# Patient Record
Sex: Female | Born: 1965
Health system: Southern US, Community
[De-identification: ages and names within clinical notes are randomized; demographics above are authoritative.]

## PROBLEM LIST (undated history)

## (undated) DIAGNOSIS — K219 Gastro-esophageal reflux disease without esophagitis: Secondary | ICD-10-CM

## (undated) DIAGNOSIS — I10 Essential (primary) hypertension: Secondary | ICD-10-CM

## (undated) DIAGNOSIS — M1990 Unspecified osteoarthritis, unspecified site: Secondary | ICD-10-CM

## (undated) DIAGNOSIS — R011 Cardiac murmur, unspecified: Secondary | ICD-10-CM

## (undated) HISTORY — DX: Unspecified osteoarthritis, unspecified site: M19.90

## (undated) HISTORY — PX: LEEP: SHX91

---

## 2005-07-16 ENCOUNTER — Ambulatory Visit: Payer: Self-pay | Admitting: Unknown Physician Specialty

## 2005-07-20 ENCOUNTER — Emergency Department: Payer: Self-pay | Admitting: Emergency Medicine

## 2005-07-20 ENCOUNTER — Other Ambulatory Visit: Payer: Self-pay

## 2006-02-20 ENCOUNTER — Emergency Department: Payer: Self-pay | Admitting: Emergency Medicine

## 2006-02-21 ENCOUNTER — Emergency Department: Payer: Self-pay | Admitting: General Practice

## 2006-04-05 ENCOUNTER — Emergency Department: Payer: Self-pay | Admitting: Unknown Physician Specialty

## 2007-03-24 ENCOUNTER — Ambulatory Visit: Payer: Self-pay | Admitting: Unknown Physician Specialty

## 2014-02-22 ENCOUNTER — Observation Stay: Payer: Self-pay | Admitting: Surgery

## 2014-02-22 LAB — COMPREHENSIVE METABOLIC PANEL
ALBUMIN: 4 g/dL (ref 3.4–5.0)
ALT: 17 U/L
ANION GAP: 12 (ref 7–16)
AST: 26 U/L (ref 15–37)
Alkaline Phosphatase: 107 U/L
BUN: 7 mg/dL (ref 7–18)
Bilirubin,Total: 0.1 mg/dL — ABNORMAL LOW (ref 0.2–1.0)
CHLORIDE: 110 mmol/L — AB (ref 98–107)
Calcium, Total: 8.2 mg/dL — ABNORMAL LOW (ref 8.5–10.1)
Co2: 18 mmol/L — ABNORMAL LOW (ref 21–32)
Creatinine: 0.72 mg/dL (ref 0.60–1.30)
EGFR (African American): >60
EGFR (Non-African Amer.): >60
Glucose: 95 mg/dL (ref 65–99)
Osmolality: 277 (ref 275–301)
POTASSIUM: 3.6 mmol/L (ref 3.5–5.1)
Sodium: 140 mmol/L (ref 136–145)
TOTAL PROTEIN: 8.2 g/dL (ref 6.4–8.2)

## 2014-02-22 LAB — CBC WITH DIFFERENTIAL/PLATELET
Basophil #: 0.1 10*3/uL (ref 0.0–0.1)
Basophil %: 1 %
EOS PCT: 2.2 %
Eosinophil #: 0.3 10*3/uL (ref 0.0–0.7)
HCT: 38.6 % (ref 35.0–47.0)
HGB: 12.1 g/dL (ref 12.0–16.0)
LYMPHS PCT: 10.4 %
Lymphocyte #: 1.4 10*3/uL (ref 1.0–3.6)
MCH: 26.6 pg (ref 26.0–34.0)
MCHC: 31.4 g/dL — ABNORMAL LOW (ref 32.0–36.0)
MCV: 85 fL (ref 80–100)
Monocyte #: 0.9 x10 3/mm (ref 0.2–0.9)
Monocyte %: 6.4 %
Neutrophil #: 11.1 10*3/uL — ABNORMAL HIGH (ref 1.4–6.5)
Neutrophil %: 80 %
Platelet: 423 10*3/uL (ref 150–440)
RBC: 4.56 10*6/uL (ref 3.80–5.20)
RDW: 17.8 % — ABNORMAL HIGH (ref 11.5–14.5)
WBC: 13.9 10*3/uL — ABNORMAL HIGH (ref 3.6–11.0)

## 2014-02-22 LAB — ETHANOL: Ethanol: 298 mg/dL

## 2014-02-23 LAB — CBC WITH DIFFERENTIAL/PLATELET
Basophil #: 0.1 10*3/uL (ref 0.0–0.1)
Basophil %: 1.3 %
Eosinophil #: 0.2 10*3/uL (ref 0.0–0.7)
Eosinophil %: 2 %
HCT: 39.1 % (ref 35.0–47.0)
HGB: 12.2 g/dL (ref 12.0–16.0)
LYMPHS ABS: 1.5 10*3/uL (ref 1.0–3.6)
Lymphocyte %: 14.8 %
MCH: 26.2 pg (ref 26.0–34.0)
MCHC: 31.3 g/dL — AB (ref 32.0–36.0)
MCV: 84 fL (ref 80–100)
Monocyte #: 1.4 x10 3/mm — ABNORMAL HIGH (ref 0.2–0.9)
Monocyte %: 13.2 %
NEUTROS ABS: 7.1 10*3/uL — AB (ref 1.4–6.5)
NEUTROS PCT: 68.7 %
Platelet: 473 10*3/uL — ABNORMAL HIGH (ref 150–440)
RBC: 4.66 10*6/uL (ref 3.80–5.20)
RDW: 17.9 % — ABNORMAL HIGH (ref 11.5–14.5)
WBC: 10.4 10*3/uL (ref 3.6–11.0)
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
## 2016-04-08 ENCOUNTER — Encounter: Payer: Self-pay | Admitting: Physician Assistant

## 2016-04-08 ENCOUNTER — Ambulatory Visit (INDEPENDENT_AMBULATORY_CARE_PROVIDER_SITE_OTHER): Payer: Self-pay | Admitting: Physician Assistant

## 2016-04-08 VITALS — BP 128/84 | HR 102 | Temp 98.3°F | Resp 20 | Wt 209.0 lb

## 2016-04-08 DIAGNOSIS — J069 Acute upper respiratory infection, unspecified: Secondary | ICD-10-CM

## 2016-04-08 DIAGNOSIS — R509 Fever, unspecified: Secondary | ICD-10-CM

## 2016-04-08 DIAGNOSIS — R6889 Other general symptoms and signs: Secondary | ICD-10-CM

## 2016-04-08 LAB — POCT INFLUENZA A/B
Influenza A, POC: NEGATIVE
Influenza B, POC: NEGATIVE

## 2016-04-08 MED ORDER — LEVALBUTEROL HCL 1.25 MG/0.5ML IN NEBU: 1.2500 mg | INHALATION_SOLUTION | Freq: Once | RESPIRATORY_TRACT | Status: DC

## 2016-04-08 MED ORDER — PREDNISONE 20 MG PO TABS: 40.0000 mg | ORAL_TABLET | Freq: Every day | ORAL | 0 refills | Status: AC

## 2016-04-08 MED ORDER — ALBUTEROL SULFATE HFA 108 (90 BASE) MCG/ACT IN AERS: 2.0000 | INHALATION_SPRAY | Freq: Four times a day (QID) | RESPIRATORY_TRACT | 2 refills | Status: DC | PRN

## 2016-04-08 MED ORDER — DOXYCYCLINE HYCLATE 100 MG PO TABS: 100.0000 mg | ORAL_TABLET | Freq: Two times a day (BID) | ORAL | 0 refills | Status: AC

## 2016-04-08 NOTE — Progress Notes (Addendum)
Tamara Hoffman  MRN: [id number] DOB: 04/12/1965  Subjective:  HPI   The patient is a 51 year old female with a 20 pack year smoking history but no other health issues and on no other medications who presents today with symptoms of cold with cough, short of breath, fatigue, fever and neck pain. She denies ear pain. She is not nauseas or vomiting. Her cough is not productive. She began having symptoms 1 week ago.  She has tried Thera Flu and Brink's Companylka Seltzer cold with no relief.  She states she is very fatigued and was unable to take the steps to get up here today.  She has a headache and is a bit sensitive to light. Patient denies pregnancy.  There are no active problems to display for this patient.   No past medical history on file.  Social History   Social History  . Marital status: Married    Spouse name: N/A  . Number of children: N/A  . Years of education: N/A   Occupational History  . Not on file.   Social History Main Topics  . Smoking status: Not on file  . Smokeless tobacco: Not on file  . Alcohol use Not on file  . Drug use: Unknown  . Sexual activity: Not on file   Other Topics Concern  . Not on file   Social History Narrative  . No narrative on file    No outpatient encounter prescriptions on file as of 04/08/2016.   No facility-administered encounter medications on file as of 04/08/2016.     Allergies  Allergen Reactions  . Phenergan [Promethazine Hcl] Swelling    Review of Systems  Constitutional: Positive for chills, diaphoresis, fever and malaise/fatigue.  HENT: Positive for congestion, ear pain, hearing loss, sinus pain, sore throat and tinnitus. Negative for ear discharge and nosebleeds.   Eyes: Positive for blurred vision, double vision, photophobia and pain. Negative for discharge and redness.  Respiratory: Positive for cough, shortness of breath and wheezing. Negative for hemoptysis and sputum production.   Cardiovascular: Positive for palpitations,  orthopnea and leg swelling (and hand swelling). Negative for chest pain.  Gastrointestinal: Positive for heartburn. Negative for abdominal pain, blood in stool, constipation, diarrhea, melena, nausea and vomiting.  Musculoskeletal: Positive for myalgias and neck pain.  Neurological: Positive for dizziness, weakness and headaches.    Objective:  BP 128/84 (BP Location: Right Arm, Patient Position: Sitting, Cuff Size: Normal)   Pulse (!) 102   Temp 98.3 F (36.8 C) (Oral)   Resp 20   Wt 209 lb (94.8 kg)   SpO2 95%   Physical Exam  Constitutional:  Non-toxic appearance. She has a sickly appearance.  HENT:  Right Ear: Tympanic membrane normal.  Left Ear: Tympanic membrane normal.  Mouth/Throat: Oropharynx is clear and moist. No oropharyngeal exudate or posterior oropharyngeal edema.  Neck: Neck supple. No neck rigidity. No edema and normal range of motion present. No Brudzinski's sign noted.  Cardiovascular: Regular rhythm.  Tachycardia present.   Pulmonary/Chest: She has wheezes. She has no rales.  Diffuse expiratory wheezing. No SOB or accessory muscle usage.  Abdominal: Soft. Bowel sounds are normal.  Lymphadenopathy:    She has no cervical adenopathy.    Assessment and Plan :  1. Fever, unspecified fever cause  Patient has 20 pack year smoking history, likely some chronic lung changes and COPD like flare in context of viral illness. Administered Xopenex in office with moderate resolution of symptoms upon re-examination. Flu was  negative and with patient's prolonged course of symptoms, will not prescribe Tamiflu. Deferring CXR today 2/2 financial issues. Will also treat with abx and prednisone and albuterol inhaler.  - levalbuterol (XOPENEX) nebulizer solution 1.25 mg; Take 1.25 mg by nebulization once.  2. Flu-like symptoms  - POCT Influenza A/B  3. Upper respiratory tract infection, unspecified type  - doxycycline (VIBRA-TABS) 100 MG tablet; Take 1 tablet (100 mg total) by  mouth 2 (two) times daily.  Dispense: 14 tablet; Refill: 0 - predniSONE (DELTASONE) 20 MG tablet; Take 2 tablets (40 mg total) by mouth daily with breakfast.  Dispense: 10 tablet; Refill: 0 - albuterol (PROVENTIL HFA;VENTOLIN HFA) 108 (90 Base) MCG/ACT inhaler; Inhale 2 puffs into the lungs every 6 (six) hours as needed for wheezing or shortness of breath.  Dispense: 1 Inhaler; Refill: 2  I have spent 25 minutes with this patient, >50% of which was spent on counseling and coordination of care.  The entirety of the information documented in the History of Present Illness, Review of Systems and Physical Exam were personally obtained by me. Portions of this information were initially documented by Janey Greaser, CMA and reviewed by me for thoroughness and accuracy.    Patient Instructions  Upper Respiratory Infection, Adult Most upper respiratory infections (URIs) are caused by a virus. A URI affects the nose, throat, and upper air passages. The most common type of URI is often called "the common cold." Follow these instructions at home:  Take medicines only as told by your doctor.  Gargle warm saltwater or take cough drops to comfort your throat as told by your doctor.  Use a warm mist humidifier or inhale steam from a shower to increase air moisture. This may make it easier to breathe.  Drink enough fluid to keep your pee (urine) clear or pale yellow.  Eat soups and other clear broths.  Have a healthy diet.  Rest as needed.  Go back to work when your fever is gone or your doctor says it is okay.  You may need to stay home longer to avoid giving your URI to others.  You can also wear a face mask and wash your hands often to prevent spread of the virus.  Use your inhaler more if you have asthma.  Do not use any tobacco products, including cigarettes, chewing tobacco, or electronic cigarettes. If you need help quitting, ask your doctor. Contact a doctor if:  You are getting worse,  not better.  Your symptoms are not helped by medicine.  You have chills.  You are getting more short of breath.  You have brown or red mucus.  You have yellow or brown discharge from your nose.  You have pain in your face, especially when you bend forward.  You have a fever.  You have puffy (swollen) neck glands.  You have pain while swallowing.  You have white areas in the back of your throat. Get help right away if:  You have very bad or constant:  Headache.  Ear pain.  Pain in your forehead, behind your eyes, and over your cheekbones (sinus pain).  Chest pain.  You have long-lasting (chronic) lung disease and any of the following:  Wheezing.  Long-lasting cough.  Coughing up blood.  A change in your usual mucus.  You have a stiff neck.  You have changes in your:  Vision.  Hearing.  Thinking.  Mood. This information is not intended to replace advice given to you by your health care provider.  Make sure you discuss any questions you have with your health care provider. Document Released: 08/05/2007 Document Revised: 10/20/2015 Document Reviewed: 05/24/2013 Elsevier Interactive Patient Education  2017 [employer].   The entirety of the information documented in the History of Present Illness, Review of Systems and Physical Exam were personally obtained by me. Portions of this information were initially documented by Janey Greaser, CMA and reviewed by me for thoroughness and accuracy.

## 2016-04-08 NOTE — Patient Instructions (Signed)
Upper Respiratory Infection, Adult Most upper respiratory infections (URIs) are caused by a virus. A URI affects the nose, throat, and upper air passages. The most common type of URI is often called "the common cold." Follow these instructions at home:  Take medicines only as told by your doctor.  Gargle warm saltwater or take cough drops to comfort your throat as told by your doctor.  Use a warm mist humidifier or inhale steam from a shower to increase air moisture. This may make it easier to breathe.  Drink enough fluid to keep your pee (urine) clear or pale yellow.  Eat soups and other clear broths.  Have a healthy diet.  Rest as needed.  Go back to work when your fever is gone or your doctor says it is okay.  You may need to stay home longer to avoid giving your URI to others.  You can also wear a face mask and wash your hands often to prevent spread of the virus.  Use your inhaler more if you have asthma.  Do not use any tobacco products, including cigarettes, chewing tobacco, or electronic cigarettes. If you need help quitting, ask your doctor. Contact a doctor if:  You are getting worse, not better.  Your symptoms are not helped by medicine.  You have chills.  You are getting more short of breath.  You have brown or red mucus.  You have yellow or brown discharge from your nose.  You have pain in your face, especially when you bend forward.  You have a fever.  You have puffy (swollen) neck glands.  You have pain while swallowing.  You have white areas in the back of your throat. Get help right away if:  You have very bad or constant:  Headache.  Ear pain.  Pain in your forehead, behind your eyes, and over your cheekbones (sinus pain).  Chest pain.  You have long-lasting (chronic) lung disease and any of the following:  Wheezing.  Long-lasting cough.  Coughing up blood.  A change in your usual mucus.  You have a stiff neck.  You have  changes in your:  Vision.  Hearing.  Thinking.  Mood. This information is not intended to replace advice given to you by your health care provider. Make sure you discuss any questions you have with your health care provider. Document Released: 08/05/2007 Document Revised: 10/20/2015 Document Reviewed: 05/24/2013 Elsevier Interactive Patient Education  2017 [employer] Inc.  

## 2016-04-14 ENCOUNTER — Telehealth: Payer: Self-pay | Admitting: Physician Assistant

## 2016-04-14 NOTE — Telephone Encounter (Signed)
Pt states she is on the last day of her antibiotics, she states she is still having coughing and having to take a breathing treatment everyday.  She states she is feeling better in her head and no fever but she still is having lung congestions.

## 2016-04-14 NOTE — Telephone Encounter (Signed)
Preferably would like patient to come back into office. Cough/congestion with these illnesses can linger, but she may need another nebulizer treatment. Is she taking her inhaler? Would also like CXR, this was deferred last time 2/2 financial reasons but I would like this before extending any antibiotic treatments.

## 2016-04-14 NOTE — Telephone Encounter (Signed)
Pt advised.  She agreed to come in, but she will have to call back and schedule an appointment.  She says she has to find someone to care for her elderly disabled mother.   Thanks,   -Vernona RiegerLaura

## 2016-04-15 NOTE — Telephone Encounter (Signed)
Sounds good, she may call back w/ questions in the mean time.

## 2017-02-17 ENCOUNTER — Encounter: Payer: Self-pay | Admitting: Emergency Medicine

## 2017-02-17 ENCOUNTER — Other Ambulatory Visit: Payer: Self-pay

## 2017-02-17 ENCOUNTER — Inpatient Hospital Stay
Admission: EM | Admit: 2017-02-17 | Discharge: 2017-02-18 | DRG: 395 | Discharge status: Against Medical Advice | Payer: Self-pay | Attending: Internal Medicine | Admitting: Internal Medicine

## 2017-02-17 DIAGNOSIS — K55059 Acute (reversible) ischemia of intestine, part and extent unspecified: Principal | ICD-10-CM | POA: Diagnosis present

## 2017-02-17 DIAGNOSIS — Z888 Allergy status to other drugs, medicaments and biological substances status: Secondary | ICD-10-CM

## 2017-02-17 DIAGNOSIS — F17210 Nicotine dependence, cigarettes, uncomplicated: Secondary | ICD-10-CM | POA: Diagnosis present

## 2017-02-17 DIAGNOSIS — F1490 Cocaine use, unspecified, uncomplicated: Secondary | ICD-10-CM | POA: Diagnosis present

## 2017-02-17 DIAGNOSIS — K922 Gastrointestinal hemorrhage, unspecified: Secondary | ICD-10-CM | POA: Diagnosis present

## 2017-02-17 DIAGNOSIS — I10 Essential (primary) hypertension: Secondary | ICD-10-CM | POA: Diagnosis present

## 2017-02-17 DIAGNOSIS — K219 Gastro-esophageal reflux disease without esophagitis: Secondary | ICD-10-CM | POA: Diagnosis present

## 2017-02-17 DIAGNOSIS — K0889 Other specified disorders of teeth and supporting structures: Secondary | ICD-10-CM | POA: Diagnosis present

## 2017-02-17 DIAGNOSIS — Z7951 Long term (current) use of inhaled steroids: Secondary | ICD-10-CM

## 2017-02-17 HISTORY — DX: Gastro-esophageal reflux disease without esophagitis: K21.9

## 2017-02-17 HISTORY — DX: Essential (primary) hypertension: I10

## 2017-02-17 LAB — TYPE AND SCREEN
ABO/RH(D): A POS
Antibody Screen: NEGATIVE

## 2017-02-17 LAB — COMPREHENSIVE METABOLIC PANEL
ALBUMIN: 4.2 g/dL (ref 3.5–5.0)
ALK PHOS: 116 U/L (ref 38–126)
ALT: 22 U/L (ref 14–54)
AST: 22 U/L (ref 15–41)
Anion gap: 10 (ref 5–15)
BILIRUBIN TOTAL: 0.6 mg/dL (ref 0.3–1.2)
BUN: 17 mg/dL (ref 6–20)
CO2: 24 mmol/L (ref 22–32)
CREATININE: 0.72 mg/dL (ref 0.44–1.00)
Calcium: 9.4 mg/dL (ref 8.9–10.3)
Chloride: 102 mmol/L (ref 101–111)
GFR calc Af Amer: >60 mL/min (ref >60)
GFR calc non Af Amer: >60 mL/min (ref >60)
GLUCOSE: 106 mg/dL — AB (ref 65–99)
Potassium: 4.2 mmol/L (ref 3.5–5.1)
Sodium: 136 mmol/L (ref 135–145)
TOTAL PROTEIN: 7.8 g/dL (ref 6.5–8.1)

## 2017-02-17 LAB — CBC
HEMATOCRIT: 47.6 % — AB (ref 35.0–47.0)
Hemoglobin: 15.5 g/dL (ref 12.0–16.0)
MCH: 29.5 pg (ref 26.0–34.0)
MCHC: 32.6 g/dL (ref 32.0–36.0)
MCV: 90.5 fL (ref 80.0–100.0)
PLATELETS: 293 10*3/uL (ref 150–440)
RBC: 5.26 MIL/uL — ABNORMAL HIGH (ref 3.80–5.20)
RDW: 15.9 % — AB (ref 11.5–14.5)
WBC: 13.3 10*3/uL — ABNORMAL HIGH (ref 3.6–11.0)

## 2017-02-17 LAB — URINE DRUG SCREEN, QUALITATIVE (ARMC ONLY)
Amphetamines, Ur Screen: NOT DETECTED
BARBITURATES, UR SCREEN: NOT DETECTED
BENZODIAZEPINE, UR SCRN: NOT DETECTED
CANNABINOID 50 NG, UR ~~LOC~~: POSITIVE — AB
Cocaine Metabolite,Ur ~~LOC~~: POSITIVE — AB
MDMA (Ecstasy)Ur Screen: NOT DETECTED
Methadone Scn, Ur: NOT DETECTED
Opiate, Ur Screen: NOT DETECTED
PHENCYCLIDINE (PCP) UR S: NOT DETECTED
Tricyclic, Ur Screen: NOT DETECTED

## 2017-02-17 LAB — URINALYSIS, COMPLETE (UACMP) WITH MICROSCOPIC
BACTERIA UA: NONE SEEN
Bilirubin Urine: NEGATIVE
Glucose, UA: NEGATIVE mg/dL
Ketones, ur: NEGATIVE mg/dL
Leukocytes, UA: NEGATIVE
NITRITE: NEGATIVE
PROTEIN: NEGATIVE mg/dL
Specific Gravity, Urine: 1.02 (ref 1.005–1.030)
pH: 5 (ref 5.0–8.0)

## 2017-02-17 LAB — LIPASE, BLOOD: Lipase: 21 U/L (ref 11–51)

## 2017-02-17 LAB — ETHANOL

## 2017-02-17 LAB — HEMOGLOBIN: Hemoglobin: 15.2 g/dL (ref 12.0–16.0)

## 2017-02-17 LAB — PREGNANCY, URINE: PREG TEST UR: NEGATIVE

## 2017-02-17 MED ORDER — OCTREOTIDE LOAD VIA INFUSION
50.0000 ug | Freq: Once | INTRAVENOUS | Status: AC
  Administered 2017-02-17: 50 ug via INTRAVENOUS
  Filled 2017-02-17: qty 25

## 2017-02-17 MED ORDER — MORPHINE SULFATE (PF) 2 MG/ML IV SOLN
1.0000 mg | INTRAVENOUS | Status: AC
  Administered 2017-02-17: 1 mg via INTRAVENOUS
  Filled 2017-02-17: qty 1

## 2017-02-17 MED ORDER — SODIUM CHLORIDE 0.9 % IV SOLN
8.0000 mg/h | INTRAVENOUS | Status: DC
  Administered 2017-02-17 – 2017-02-18 (×2): 8 mg/h via INTRAVENOUS
  Filled 2017-02-17 (×2): qty 80

## 2017-02-17 MED ORDER — SODIUM CHLORIDE 0.9 % IV SOLN
50.0000 ug/h | INTRAVENOUS | Status: DC
Start: 2017-02-17 — End: 2017-02-18
  Administered 2017-02-17 (×2): 50 ug/h via INTRAVENOUS
  Filled 2017-02-17 (×6): qty 1

## 2017-02-17 MED ORDER — ALBUTEROL SULFATE (2.5 MG/3ML) 0.083% IN NEBU: 2.5000 mg | INHALATION_SOLUTION | Freq: Four times a day (QID) | RESPIRATORY_TRACT | Status: DC | PRN

## 2017-02-17 MED ORDER — SODIUM CHLORIDE 0.9 % IV SOLN
80.0000 mg | Freq: Once | INTRAVENOUS | Status: AC
  Administered 2017-02-17: 80 mg via INTRAVENOUS
  Filled 2017-02-17: qty 80

## 2017-02-17 MED ORDER — SODIUM CHLORIDE 0.9 % IV SOLN
INTRAVENOUS | Status: DC
  Administered 2017-02-17 – 2017-02-18 (×2): via INTRAVENOUS

## 2017-02-17 MED ORDER — ONDANSETRON HCL 4 MG/2ML IJ SOLN
4.0000 mg | Freq: Once | INTRAMUSCULAR | Status: AC
  Administered 2017-02-17: 4 mg via INTRAVENOUS
  Filled 2017-02-17: qty 2

## 2017-02-17 MED ORDER — TOPIRAMATE 25 MG PO TABS
50.0000 mg | ORAL_TABLET | Freq: Two times a day (BID) | ORAL | Status: DC | PRN
  Administered 2017-02-17: 50 mg via ORAL
  Filled 2017-02-17 (×2): qty 2

## 2017-02-17 MED ORDER — ACETAMINOPHEN 325 MG PO TABS
650.0000 mg | ORAL_TABLET | Freq: Four times a day (QID) | ORAL | Status: DC | PRN
  Administered 2017-02-17 – 2017-02-18 (×2): 650 mg via ORAL
  Filled 2017-02-17 (×2): qty 2

## 2017-02-17 MED ORDER — LORAZEPAM 2 MG/ML IJ SOLN
1.0000 mg | Freq: Once | INTRAMUSCULAR | Status: AC
  Administered 2017-02-17: 1 mg via INTRAVENOUS
  Filled 2017-02-17: qty 1

## 2017-02-17 MED ORDER — HYDRALAZINE HCL 20 MG/ML IJ SOLN
10.0000 mg | Freq: Four times a day (QID) | INTRAMUSCULAR | Status: DC | PRN
  Administered 2017-02-17: 10 mg via INTRAVENOUS
  Filled 2017-02-17: qty 1

## 2017-02-17 MED ORDER — ONDANSETRON HCL 4 MG/2ML IJ SOLN
4.0000 mg | Freq: Four times a day (QID) | INTRAMUSCULAR | Status: DC | PRN
  Administered 2017-02-17: 4 mg via INTRAVENOUS
  Filled 2017-02-17: qty 2

## 2017-02-17 MED ORDER — INFLUENZA VAC SPLIT QUAD 0.5 ML IM SUSY
0.5000 mL | PREFILLED_SYRINGE | INTRAMUSCULAR | Status: DC
  Filled 2017-02-17: qty 0.5

## 2017-02-17 MED ORDER — TRAMADOL HCL 50 MG PO TABS
50.0000 mg | ORAL_TABLET | Freq: Four times a day (QID) | ORAL | Status: DC | PRN
  Administered 2017-02-17: 50 mg via ORAL
  Filled 2017-02-17: qty 1

## 2017-02-17 MED ORDER — ALBUTEROL SULFATE HFA 108 (90 BASE) MCG/ACT IN AERS: 2.0000 | INHALATION_SPRAY | Freq: Four times a day (QID) | RESPIRATORY_TRACT | Status: DC | PRN

## 2017-02-17 MED ORDER — ONDANSETRON HCL 4 MG PO TABS: 4.0000 mg | ORAL_TABLET | Freq: Four times a day (QID) | ORAL | Status: DC | PRN

## 2017-02-17 MED ORDER — ACETAMINOPHEN 650 MG RE SUPP: 650.0000 mg | Freq: Four times a day (QID) | RECTAL | Status: DC | PRN

## 2017-02-17 NOTE — H&P (Signed)
[facility] [place] [facility]   PATIENT NAME: Tamara Hoffman    MR#:  [id number]  DATE OF BIRTH:  07/22/1965  DATE OF ADMISSION:  02/17/2017  PRIMARY CARE PHYSICIAN: Tamsen Roershrismon, Dennis E, PA   REQUESTING/REFERRING PHYSICIAN: Dr. Daryel NovemberJonathan Williams  CHIEF COMPLAINT:   Chief Complaint  Patient presents with  . Abdominal Pain  . Hematemesis    HISTORY OF PRESENT ILLNESS:  Tamara Hoffman  is a 51 y.o. female with a known history of hypertension not in any medications, GERD presents to hospital secondary to bloody diarrhea. Patient has had history of reflux in the past and she was on Prilosec up until 3 months ago. She has occasional epigastric pain. Last week she's been having toothache for which she was taking a lot of Advil and also Goody powders for the pain. Yesterday she ate seafood at a restaurant, and she also took magnesium hydroxide for constipation. She started having nausea and had bloody vomitus after that. Later in the day she started having lower abdominal cramping with bloody stools. She's had several episodes of blood in rectum since last night. Hemoglobin initially is 15. She is started on Protonix drip and octreotide drip due to history of alcohol use. She drinks about 3-4 beers every night. Bleeding scan is pending at this time.  PAST MEDICAL HISTORY:   Past Medical History:  Diagnosis Date  . GERD (gastroesophageal reflux disease)   . Hypertension     PAST SURGICAL HISTORY:   Past Surgical History:  Procedure Laterality Date  . LEEP      SOCIAL HISTORY:   Social History   Tobacco Use  . Smoking status: Current Every Day Smoker    Packs/day: 1.00    Types: Cigarettes  . Smokeless tobacco: Never Used  Substance Use Topics  . Alcohol use: Yes    Comment: 3-4 beers/day    FAMILY HISTORY:   Family History  Problem Relation Age of Onset  . Diabetes Mother   . Hypertension Father     DRUG ALLERGIES:   Allergies  Allergen  Reactions  . Phenergan [Promethazine Hcl] Swelling    REVIEW OF SYSTEMS:   Review of Systems  Constitutional: Positive for malaise/fatigue. Negative for chills, fever and weight loss.  HENT: Negative for ear discharge, ear pain, hearing loss and nosebleeds.   Eyes: Negative for blurred vision, double vision and photophobia.  Respiratory: Negative for cough, hemoptysis, shortness of breath and wheezing.   Cardiovascular: Negative for chest pain, palpitations, orthopnea and leg swelling.  Gastrointestinal: Positive for abdominal pain, blood in stool, heartburn, nausea and vomiting. Negative for constipation, diarrhea and melena.  Genitourinary: Negative for dysuria, frequency, hematuria and urgency.  Musculoskeletal: Negative for back pain, myalgias and neck pain.  Skin: Negative for rash.  Neurological: Positive for headaches. Negative for dizziness, tingling, tremors, sensory change, speech change and focal weakness.  Endo/Heme/Allergies: Does not bruise/bleed easily.  Psychiatric/Behavioral: Negative for depression.    MEDICATIONS AT HOME:   Prior to Admission medications   Medication Sig Start Date End Date Taking? Authorizing Provider  albuterol (PROVENTIL HFA;VENTOLIN HFA) 108 (90 Base) MCG/ACT inhaler Inhale 2 puffs into the lungs every 6 (six) hours as needed for wheezing or shortness of breath. 04/08/16   Trey SailorsPollak, Adriana M, PA-C      VITAL SIGNS:  Blood pressure (!) 182/105, pulse 98, temperature (!) 97.4 F (36.3 C), temperature source Oral, resp. rate 16, height 5\' 4"  (1.626 m), weight 79.4 kg (175 lb),  SpO2 96 %.  PHYSICAL EXAMINATION:   Physical Exam  GENERAL:  51 y.o.-year-old patient lying in the bed with no acute distress.  EYES: Pupils equal, round, reactive to light and accommodation. No scleral icterus. Extraocular muscles intact.  HEENT: Head atraumatic, normocephalic. Oropharynx and nasopharynx clear.  NECK:  Supple, no jugular venous distention. No thyroid  enlargement, no tenderness.  LUNGS: Normal breath sounds bilaterally, no wheezing, rales,rhonchi or crepitation. No use of accessory muscles of respiration.  CARDIOVASCULAR: S1, S2 normal. No murmurs, rubs, or gallops.  ABDOMEN: Soft, tender in the epigastric and lower abdomen, no guarding or rigidity. nondistended. Bowel sounds present. No organomegaly or mass.  EXTREMITIES: No pedal edema, cyanosis, or clubbing.  NEUROLOGIC: Cranial nerves II through XII are intact. Muscle strength 5/5 in all extremities. Sensation intact. Gait not checked.  PSYCHIATRIC: The patient is alert and oriented x 3.  SKIN: No obvious rash, lesion, or ulcer.   LABORATORY PANEL:   CBC Recent Labs  Lab 02/17/17 1223  WBC 13.3*  HGB 15.5  HCT 47.6*  PLT 293   ------------------------------------------------------------------------------------------------------------------  Chemistries  Recent Labs  Lab 02/17/17 1223  NA 136  K 4.2  CL 102  CO2 24  GLUCOSE 106*  BUN 17  CREATININE 0.72  CALCIUM 9.4  AST 22  ALT 22  ALKPHOS 116  BILITOT 0.6   ------------------------------------------------------------------------------------------------------------------  Cardiac Enzymes No results for input(s): TROPONINI in the last 168 hours. ------------------------------------------------------------------------------------------------------------------  RADIOLOGY:  No results found.  EKG:   Orders placed or performed in visit on 07/20/05  . EKG 12-Lead    IMPRESSION AND PLAN:   Tamara Hoffman  is a 51 y.o. female with a known history of hypertension not in any medications, GERD presents to hospital secondary to bloody diarrhea.   1. Bloody diarrhea and hematemesis- possible upper gi bleed - uses NSAIDS and BC powders at home - NPO, IV protonix and IV octreotide -GI notified. -Bleeding scan. Hemoglobin checked every 8 hours and monitor. -No acute indication for transfusion at this time.  2.  Tobacco use disorder-patient refuses nicotine patch.  3. Alcohol use-patient denies going into withdrawals. She drinks only 3-4 beers most nights. Continue to monitor at this time  4. Hypertension-does not take any medications at home. IV hydralazine when necessary for now  5. DVT prophylaxis-Ted's and SCDs   All the records are reviewed and case discussed with ED provider. Management plans discussed with the patient, family and they are in agreement.  CODE STATUS: Full Code  TOTAL TIME TAKING CARE OF THIS PATIENT: 50 minutes.    Enid BaasKALISETTI,Telesforo Brosnahan M.D on 02/17/2017 at 3:28 PM  Between 7am to 6pm - Pager - [phone]  After 6pm go to [email or web address] - Social research officer, governmentpassword EPAS [facility]  [facility]  Office  [phone]  CC: Primary care physician; Chrismon, Jodell Ciproennis E, PA

## 2017-02-17 NOTE — ED Notes (Signed)
NM called this RN asking about last time pt noticed bleeding. Pt states last bloody stool was 4hrs ago, making it 1 hr PTA. States last emesis around 4am.

## 2017-02-17 NOTE — ED Notes (Addendum)
Pt states L lower tooth pain x few weeks. "I've been taking all this advil and stuff." states had constipation. Drank magnesium and states began vomiting blood yesterday. States pink in color. States passing dark red and clots per rectum. States "really my stomach has been messed up x 2 months." states has all belly organs. States she has defecated 12 times. Pt appears anxious. Tearful during assessment. Dr. Mayford KnifeWilliams at bedside.   Admits to taking advil liquid caps (states she squirted contents into mouth) and goody powders.   States nausea. States hasn't been able to drink anything. States she feels "sensation of having to go" and admits to straining for BM.   Denies blood thinner use. Admits to ETOH, denies every day use.

## 2017-02-17 NOTE — Consult Note (Signed)
Tamara Hoffman , MD [address], [address], [place], [place], [id number] [address], [address], [place], [place], [address] Phone: [phone]  Fax: [phone]  Consultation  Referring Provider:  Dr Nemiah CommanderKalisetti Primary Care Physician:  Tamsen Roershrismon, Dennis E, PA Primary Gastroenterologist:  None    Reason for Consultation:   GI bleed  Date of Admission:  02/17/2017 Date of Consultation:  02/17/2017         HPI:   Tamara Hoffman is a 51 y.o. female presented to the emergency room on 02/17/2017 with hematemesis.  She has been taking a lot of Advil and Goody powders for toothache in the past 1 week.  She started having some nausea and bloody vomitus after having food at a restaurant yesterday.  She also had some lower abdominal pain with cramping and bloody stools.  She has a history of alcohol abuse she drinks about 3-4 beers every night.  Hemoglobin admission was stable at 15 g.  Urine was positive for cocaine and THC.  There was no elevation of the BUN which was 17 on admission.  She initially denied any use of cocaine but when I did say she had some in her urine , she became very upset and confrontational. I did assure her that the cocaine in her urine would not affect the method of arriving of her medical diagnosis per my opinion. She said she had a lot of pain from her teeth. Denies any bowel movement since yesterday . Not in any pain since admission.   Past Medical History:  Diagnosis Date  . GERD (gastroesophageal reflux disease)   . Hypertension     Past Surgical History:  Procedure Laterality Date  . LEEP      Prior to Admission medications   Medication Sig Start Date End Date Taking? Authorizing Provider  Aspirin-Acetaminophen-Caffeine (GOODYS EXTRA STRENGTH PO) Take 1-2 packets by mouth as needed.   Yes [provider]  ibuprofen (ADVIL,MOTRIN) 200 MG tablet Take 200-400 mg by mouth every 6 (six) hours as needed.   Yes [provider]  albuterol (PROVENTIL  HFA;VENTOLIN HFA) 108 (90 Base) MCG/ACT inhaler Inhale 2 puffs into the lungs every 6 (six) hours as needed for wheezing or shortness of breath. Patient not taking: Reported on 02/17/2017 04/08/16   Trey SailorsPollak, Adriana M, PA-C    Family History  Problem Relation Age of Onset  . Diabetes Mother   . Hypertension Father   . Heart disease Father   . Diabetes Sister   . Cancer Maternal Grandmother      Social History   Tobacco Use  . Smoking status: Current Every Day Smoker    Packs/day: 0.50    Types: Cigarettes  . Smokeless tobacco: Never Used  Substance Use Topics  . Alcohol use: Yes    Comment: 4-5 times a week (3 or 4 beers)  . Drug use: No    Allergies as of 02/17/2017 - Review Complete 02/17/2017  Allergen Reaction Noted  . Phenergan [promethazine hcl] Swelling 04/08/2016    Review of Systems:    All systems reviewed and negative except where noted in HPI.   Physical Exam:  Vital signs in last 24 hours: Temp:  [97.4 F (36.3 C)-98.2 F (36.8 C)] 97.8 F (36.6 C) (12/19 1957) Pulse Rate:  [89-113] 113 (12/19 1957) Resp:  [11-20] 16 (12/19 1957) BP: (158-194)/(92-120) 158/103 (12/19 1957) SpO2:  [90 %-99 %] 99 % (12/19 1957) Weight:  [175 lb (79.4 kg)] 175 lb (79.4 kg) (  12/19 1223) Last BM Date: 02/17/17 General:   Pleasant, cooperative in NAD Head:  Normocephalic and atraumatic. Eyes:   No icterus.   Conjunctiva pink. PERRLA. Ears:  Normal auditory acuity. Neck:  Supple; no masses or thyroidomegaly Lungs: Respirations even and unlabored. Lungs clear to auscultation bilaterally.   No wheezes, crackles, or rhonchi.  Heart:  Regular rate and rhythm;  Without murmur, clicks, rubs or gallops Abdomen:  Soft, nondistended, nontender. Normal bowel sounds. No appreciable masses or hepatomegaly.  No rebound or guarding.  Neurologic:  Alert and oriented x3;  grossly normal neurologically. Skin:  Intact without significant lesions or rashes. Cervical Nodes:  No significant  cervical adenopathy. Psych:  Alert and cooperative. Normal affect.  LAB RESULTS: Recent Labs    02/17/17 1223 02/17/17 1611  WBC 13.3*  --   HGB 15.5 15.2  HCT 47.6*  --   PLT 293  --    BMET Recent Labs    02/17/17 1223  NA 136  K 4.2  CL 102  CO2 24  GLUCOSE 106*  BUN 17  CREATININE 0.72  CALCIUM 9.4   LFT Recent Labs    02/17/17 1223  PROT 7.8  ALBUMIN 4.2  AST 22  ALT 22  ALKPHOS 116  BILITOT 0.6   PT/INR No results for input(s): LABPROT, INR in the last 72 hours.  STUDIES: No results found.    Impression / Plan:   Tamara Hoffman is a 51 y.o. y/o female presented to the emergency room with a short history of nausea vomiting hematemesis and bloody diarrhea.  She has a prior history of ingestion of seafood at a restaurant following which her symptoms began.  She has been using a lot of NSAIDs and Goody powder for her to take the past 1 week. Differentials for a GI bleed include peptic ulcer vs mallory weiss tear from [facility], ischemic colitis from cocaine use, food poisoning or bleeding from her gums after teeth extraction  The urine toxicology showed that she had cocaine and THC in her urine.  Her hemoglobin has been stable.  Upper endoscopy would be contraindicated due to the presence of cocaine in her urine.  It is reassuring to note that her hemoglobin has been stable.  I would suggest to continue on PPI monitor her hemoglobin and if stable she can be discharged with follow-up with the PCP.  Continue PPI. Stop all NSAID use.  Check stool for H. pylori antigen and treat if positive.  I strongly suggested to stop drinking alcohol/usage of cocaine.   I have discussed the plan with Dr Nemiah CommanderKalisetti  Thank you for involving me in the care of this patient.      LOS: 0 days   Tamara MoodKiran Britanny Marksberry, MD  02/17/2017, 8:12 PM

## 2017-02-17 NOTE — Progress Notes (Signed)
No bleeding in the last 6 hrs- discontinued the bleeding scan

## 2017-02-17 NOTE — Progress Notes (Signed)
HCPOA/AD materials dropped off with patient.  CH available to review as needed. 

## 2017-02-17 NOTE — ED Notes (Signed)
Pt states hasn't taken BP medications x 10 years.

## 2017-02-17 NOTE — ED Triage Notes (Addendum)
Pt presents to ED with c/o lower abdominal pain, vomiting blood and rectal bleeding. Pt states has been taking Advil gel capsules, goodies powder, and keflex due to dental pain. Pt states last night began vomiting blood, pt states she vomited up "everything in [her] stomach and then when that was done it was just blood". Pt states "blood was pouring out of her rectum". Pt is noted to be anxious and tearful in triage at this time.

## 2017-02-17 NOTE — ED Notes (Signed)
Admitting at bedside.  Pt ambulatory to toilet to collect urine sample.

## 2017-02-17 NOTE — ED Provider Notes (Signed)
[facility] Emergency Department Provider Note       Time seen: ----------------------------------------- 12:41 PM on 02/17/2017 -----------------------------------------   I have reviewed the triage vital signs and the nursing notes.  HISTORY   Chief Complaint Abdominal Pain and Hematemesis    HPI Tamara Hoffman is a 51 y.o. female with a history of hypertension who presents to the ED for abdominal pain with hematemesis and rectal bleeding.  Patient states she has been taking Advil gelcaps, Goody's powder and Keflex due to dental pain.  She reports last night she began vomiting blood.  Patient states she vomited up everything in her stomach and then when it was done it was only blood.  She also reports but has been coming out of her rectum as well.  Pain is 7 out of 10 in sharp and cramping in the lower abdomen.  Past Medical History:  Diagnosis Date  . Hypertension     There are no active problems to display for this patient.   Past Surgical History:  Procedure Laterality Date  . LEEP      Allergies Phenergan [promethazine hcl]  Social History Social History   Tobacco Use  . Smoking status: Current Every Day Smoker    Packs/day: 1.00    Types: Cigarettes  . Smokeless tobacco: Never Used  Substance Use Topics  . Alcohol use: Yes  . Drug use: No    Review of Systems Constitutional: Negative for fever. Cardiovascular: Negative for chest pain. Respiratory: Negative for shortness of breath. Gastrointestinal: Positive for abdominal pain, gastrointestinal bleeding Genitourinary: Negative for dysuria. Musculoskeletal: Negative for back pain. Skin: Negative for rash. Neurological: Negative for headaches, focal weakness or numbness.  All systems negative/normal/unremarkable except as stated in the HPI  ____________________________________________   PHYSICAL EXAM:  VITAL SIGNS: ED Triage Vitals [02/17/17 1223]  Enc Vitals Group      BP (!) 174/120     Pulse Rate (!) 107     Resp 20     Temp (!) 97.4 F (36.3 C)     Temp Source Oral     SpO2 99 %     Weight 175 lb (79.4 kg)     Height 5\' 4"  (1.626 m)     Head Circumference      Peak Flow      Pain Score 7     Pain Loc      Pain Edu?      Excl. in GC?     Constitutional: Alert and oriented. Well appearing and in no distress. Eyes: Conjunctivae are normal. Normal extraocular movements. ENT   Head: Normocephalic and atraumatic.   Nose: No congestion/rhinnorhea.   Mouth/Throat: Mucous membranes are moist.   Neck: No stridor. Cardiovascular: Normal rate, regular rhythm. No murmurs, rubs, or gallops. Respiratory: Normal respiratory effort without tachypnea nor retractions. Breath sounds are clear and equal bilaterally. No wheezes/rales/rhonchi. Gastrointestinal: Soft and nontender. Normal bowel sounds Musculoskeletal: Nontender with normal range of motion in extremities. No lower extremity tenderness nor edema. Neurologic:  Normal speech and language. No gross focal neurologic deficits are appreciated.  Skin:  Skin is warm, dry and intact. No rash noted. Psychiatric: Mood and affect are normal. Speech and behavior are normal.  ____________________________________________  ED COURSE:  As part of my medical decision making, I reviewed the following data within the electronic MEDICAL RECORD NUMBER History obtained from family if available, nursing notes, old chart and ekg, as well as notes from prior ED visits. Patient  presented for gastrointestinal bleeding, we will assess with labs and imaging as indicated at this time.   Procedures ____________________________________________   LABS (pertinent positives/negatives)  Labs Reviewed  COMPREHENSIVE METABOLIC PANEL - Abnormal; Notable for the following components:      Result Value   Glucose, Bld 106 (*)    All other components within normal limits  CBC - Abnormal; Notable for the following components:    WBC 13.3 (*)    RBC 5.26 (*)    HCT 47.6 (*)    RDW 15.9 (*)    All other components within normal limits  LIPASE, BLOOD  ETHANOL  URINALYSIS, COMPLETE (UACMP) WITH MICROSCOPIC  URINE DRUG SCREEN, QUALITATIVE ([facility] ONLY)  POC OCCULT BLOOD, ED  POC URINE PREG, ED  TYPE AND SCREEN   CRITICAL CARE Performed by: Hortensia Duffin E   Total critical cEmily Filbertare time: 30 minutes  Critical care time was exclusive of separately billable procedures and treating other patients.  Critical care was necessary to treat or prevent imminent or life-threatening deterioration.  Critical care was time spent personally by me on the following activities: development of treatment plan with patient and/or surrogate as well as nursing, discussions with consultants, evaluation of patient's response to treatment, examination of patient, obtaining history from patient or surrogate, ordering and performing treatments and interventions, ordering and review of laboratory studies, ordering and review of radiographic studies, pulse oximetry and re-evaluation of patient's condition. ____________________________________________  DIFFERENTIAL DIAGNOSIS   Variceal bleed, peptic ulcer disease, anemia, gastritis, dehydration, aortoenteric fistula  FINAL ASSESSMENT AND PLAN  Gastrointestinal bleeding   Plan: Patient had presented for gastrointestinal bleeding that is both upper and lower. Patient's labs are reassuring at this time although we have started her on Protonix and octreotide.  This could be a variceal bleed versus peptic ulcer disease versus alcoholic gastritis.  Rectal bleeding could be coming from hemorrhoids.  At this point her vital signs are stable and she is stable for hospitalist admission.   Emily FilbertWilliams, Narvel Kozub E, MD   Note: This note was generated in part or whole with voice recognition software. Voice recognition is usually quite accurate but there are transcription errors that can and very  often do occur. I apologize for any typographical errors that were not detected and corrected.     Emily FilbertWilliams, Adrion Menz E, MD 02/17/17 [phone]

## 2017-02-18 DIAGNOSIS — K920 Hematemesis: Secondary | ICD-10-CM

## 2017-02-18 LAB — CBC
HCT: 44.7 % (ref 35.0–47.0)
HEMOGLOBIN: 14.8 g/dL (ref 12.0–16.0)
MCH: 30.1 pg (ref 26.0–34.0)
MCHC: 33.2 g/dL (ref 32.0–36.0)
MCV: 90.7 fL (ref 80.0–100.0)
Platelets: 246 10*3/uL (ref 150–440)
RBC: 4.93 MIL/uL (ref 3.80–5.20)
RDW: 15.7 % — ABNORMAL HIGH (ref 11.5–14.5)
WBC: 7.2 10*3/uL (ref 3.6–11.0)

## 2017-02-18 LAB — BASIC METABOLIC PANEL
ANION GAP: 8 (ref 5–15)
BUN: 11 mg/dL (ref 6–20)
CHLORIDE: 104 mmol/L (ref 101–111)
CO2: 26 mmol/L (ref 22–32)
CREATININE: 0.8 mg/dL (ref 0.44–1.00)
Calcium: 9.3 mg/dL (ref 8.9–10.3)
GFR calc non Af Amer: >60 mL/min (ref >60)
GLUCOSE: 115 mg/dL — AB (ref 65–99)
Potassium: 4.2 mmol/L (ref 3.5–5.1)
Sodium: 138 mmol/L (ref 135–145)

## 2017-02-18 LAB — HEMOGLOBIN
Hemoglobin: 14.7 g/dL (ref 12.0–16.0)
Hemoglobin: 15.5 g/dL (ref 12.0–16.0)

## 2017-02-18 LAB — HIV ANTIBODY (ROUTINE TESTING W REFLEX): HIV Screen 4th Generation wRfx: NONREACTIVE

## 2017-02-18 MED ORDER — PANTOPRAZOLE SODIUM 40 MG PO TBEC
40.0000 mg | DELAYED_RELEASE_TABLET | Freq: Two times a day (BID) | ORAL | Status: DC
  Filled 2017-02-18: qty 1

## 2017-02-18 MED ORDER — PANTOPRAZOLE SODIUM 40 MG PO TBEC: 40.0000 mg | DELAYED_RELEASE_TABLET | Freq: Two times a day (BID) | ORAL | 2 refills | Status: DC

## 2017-02-18 NOTE — Discharge Summary (Signed)
[facility] [place] [facility]   PATIENT NAME: Tamara Hoffman    MR#:  [id number]  DATE OF BIRTH:  06/12/1965  DATE OF ADMISSION:  02/17/2017   ADMITTING PHYSICIAN: Enid Baasadhika Sabria Florido, MD  DATE OF DISCHARGE: 02/18/2017 10:33 AM PATIENT LEFT AMA  PRIMARY CARE PHYSICIAN: Chrismon, Jodell Ciproennis E, PA   ADMISSION DIAGNOSIS:   Gastrointestinal hemorrhage, unspecified gastrointestinal hemorrhage type [K92.2]  DISCHARGE DIAGNOSIS:   Active Problems:   GI bleed   SECONDARY DIAGNOSIS:   Past Medical History:  Diagnosis Date  . GERD (gastroesophageal reflux disease)   . Hypertension     HOSPITAL COURSE:   Tamara Hoffman  is a 51 y.o. female with a known history of hypertension not in any medications, GERD presents to hospital secondary to bloody diarrhea.   1. Bloody diarrhea and hematemesis-   -Admitted, no further bloody stools in the hospital. Hemoglobin has remained stable. -Started after eating seafood at an outside restaurant. Could be acute enterocolitis, versus cocaine induced ischemic colitis or upper GI bleed from using too many NSAIDS and BC powders -Patient was started on Protonix drip and octreotide drip due to history of alcohol intake. -Seen by gastroenterology who have recommended no EGD at this time due to cocaine in the urine and also since the bleeding has stopped and hemoglobin is stable. -My plan was to discharge her on oral Protonix and start her on a regular diet prior to discharge. However patient was upset and left AMA before me seeing her this morning due to no EGD being done and about finding cocaine in her urine. -No acute indication for transfusion at this time. - advised on admission not to use NSAIDS or bc powders  2. Tobacco use disorder-patient refused nicotine patch.  3. Alcohol use-patient denies going into withdrawals. She drinks only 3-4 beers most nights.    4. Hypertension-does not take any medications at home. Counseled  about low sodium diet  Again patient left AGAINST MEDICAL ADVICE prior to me seeing her today. Did not take her Protonix prescription.    CONSULTS OBTAINED:   Treatment Team:  Wyline MoodAnna, Kiran, MD  DRUG ALLERGIES:   Allergies  Allergen Reactions  . Phenergan [Promethazine Hcl] Swelling   DISCHARGE MEDICATIONS:   Allergies as of 02/18/2017      Reactions   Phenergan [promethazine Hcl] Swelling      Medication List    STOP taking these medications   GOODYS EXTRA STRENGTH PO   ibuprofen 200 MG tablet Commonly known as:  ADVIL,MOTRIN     TAKE these medications   albuterol 108 (90 Base) MCG/ACT inhaler Commonly known as:  PROVENTIL HFA;VENTOLIN HFA Inhale 2 puffs into the lungs every 6 (six) hours as needed for wheezing or shortness of breath.   pantoprazole 40 MG tablet Commonly known as:  PROTONIX Take 1 tablet (40 mg total) by mouth 2 (two) times daily.        DISCHARGE INSTRUCTIONS:   1. Left AMA 2. Recommend GI follow up  If you experience worsening of your admission symptoms, develop shortness of breath, life threatening emergency, suicidal or homicidal thoughts you must seek medical attention immediately by calling 911 or calling your MD immediately  if symptoms less severe.  You Must read complete instructions/literature along with all the possible adverse reactions/side effects for all the Medicines you take and that have been prescribed to you. Take any new Medicines after you have completely understood and accpet all the possible adverse reactions/side effects.  Please note  You were cared for by a hospitalist during your hospital stay. If you have any questions about your discharge medications or the care you received while you were in the hospital after you are discharged, you can call the unit and asked to speak with the hospitalist on call if the hospitalist that took care of you is not available. Once you are discharged, your primary care physician  will handle any further medical issues. Please note that NO REFILLS for any discharge medications will be authorized once you are discharged, as it is imperative that you return to your primary care physician (or establish a relationship with a primary care physician if you do not have one) for your aftercare needs so that they can reassess your need for medications and monitor your lab values.    On the day of Discharge:  VITAL SIGNS:   Blood pressure (!) 157/84, pulse 76, temperature 97.7 F (36.5 C), temperature source Oral, resp. rate 18, height 5\' 4"  (1.626 m), weight 79.4 kg (175 lb), SpO2 97 %.   DATA REVIEW:   CBC Recent Labs  Lab 02/18/17 0503 02/18/17 0828  WBC 7.2  --   HGB 14.8 15.5  HCT 44.7  --   PLT 246  --     Chemistries  Recent Labs  Lab 02/17/17 1223 02/18/17 0503  NA 136 138  K 4.2 4.2  CL 102 104  CO2 24 26  GLUCOSE 106* 115*  BUN 17 11  CREATININE 0.72 0.80  CALCIUM 9.4 9.3  AST 22  --   ALT 22  --   ALKPHOS 116  --   BILITOT 0.6  --      Microbiology Results  No results found for this or any previous visit.  RADIOLOGY:  No results found.   Management plans discussed with the patient, family and they are in agreement.  CODE STATUS:     Code Status Orders  (From admission, onward)        Start     Ordered   02/17/17 1602  Full code  Continuous     02/17/17 1601    Code Status History    Date Active Date Inactive Code Status Order ID Comments User Context   This patient has a current code status but no historical code status.      TOTAL TIME TAKING CARE OF THIS PATIENT: 36 minutes.    Enid BaasKALISETTI,Dametra Whetsel M.D on 02/18/2017 at 10:41 AM  Between 7am to 6pm - Pager - [phone]  After 6pm go to [email or web address] - Social research officer, governmentpassword [email or web address] [email or web address]  [facility]  Office  [phone]  CC: Primary care physician; Tamsen Roershrismon, Dennis E, PA   Note: This dictation was prepared with Dragon dictation along with smaller  phrase technology. Any transcriptional errors that result from this process are unintentional.

## 2017-02-18 NOTE — Progress Notes (Signed)
Patient wanted to leave the hospital today, RN called me this morning and I did tell that I will be on the floor in 20 minutes as I was planning to discharge her anyway. Went in to see the patient, her hospital gown and was on the bed and her IV was in the trash can. She was nowhere to be seen. It seems like she has already left the hospital. No AMA form was signed at that time.

## 2017-02-18 NOTE — Progress Notes (Signed)
Patient called nurse to room and stated " please take these iv's out or I will ,there is no need for me to be here he said he isn't going to do anything and who is he to judge me and talk to me that way" patient then began crying, consoled patient and explained to her why the doctor told her that he could not do the procedure relating to her having cocaine in her system, patient verbalized understanding. Patient stated " I'm just going to leave". Nurse asked patient if she could hold on a few minutes and let the provider prepare her discharge paperwork, patient stated " yes". When provider arrived to room to discharge patient, patient had left and the hospital gown was on the bed. No AMA form was completed as patient stated that she was  Going to wait on provider to discharge her.

## 2017-12-07 ENCOUNTER — Encounter: Payer: Self-pay | Admitting: Physician Assistant

## 2017-12-07 ENCOUNTER — Ambulatory Visit: Payer: BLUE CROSS/BLUE SHIELD | Admitting: Physician Assistant

## 2017-12-07 VITALS — BP 140/90 | HR 98 | Temp 98.2°F | Resp 16 | Wt 196.0 lb

## 2017-12-07 DIAGNOSIS — Z720 Tobacco use: Secondary | ICD-10-CM

## 2017-12-07 DIAGNOSIS — J40 Bronchitis, not specified as acute or chronic: Secondary | ICD-10-CM | POA: Diagnosis not present

## 2017-12-07 MED ORDER — PREDNISONE 20 MG PO TABS: 20.0000 mg | ORAL_TABLET | Freq: Every day | ORAL | 0 refills | Status: AC

## 2017-12-07 MED ORDER — ALBUTEROL SULFATE HFA 108 (90 BASE) MCG/ACT IN AERS: 2.0000 | INHALATION_SPRAY | Freq: Four times a day (QID) | RESPIRATORY_TRACT | 2 refills | Status: DC | PRN

## 2017-12-07 MED ORDER — DOXYCYCLINE HYCLATE 100 MG PO TABS: 100.0000 mg | ORAL_TABLET | Freq: Two times a day (BID) | ORAL | 0 refills | Status: AC

## 2017-12-07 NOTE — Progress Notes (Signed)
Patient: Tamara Hoffman Female    DOB: 08-06-1965   52 y.o.   MRN: [id number] Visit Date: 12/07/2017  Today's Provider: Trey Sailors, PA-C   Chief Complaint  Patient presents with  . URI   Subjective:    HPI Upper Respiratory Infection: Patient complains of symptoms of a URI, possible sinusitis. Symptoms include congestion, cough and sore throat. Onset of symptoms was 2 weeks ago, gradually worsening since that time. She also c/o cough described as nonproductive for the past 2 weeks .  She is drinking plenty of fluids. Evaluation to date: none. Treatment to date: antihistamines and cough suppressants. Current smoker.      Allergies  Allergen Reactions  . Phenergan [Promethazine Hcl] Swelling     Current Outpatient Medications:  .  pantoprazole (PROTONIX) 40 MG tablet, Take 1 tablet (40 mg total) by mouth 2 (two) times daily., Disp: 60 tablet, Rfl: 2  Review of Systems  Constitutional: Negative.   HENT: Positive for sore throat.   Respiratory: Positive for cough.     Social History   Tobacco Use  . Smoking status: Current Every Day Smoker    Packs/day: 0.50    Types: Cigarettes  . Smokeless tobacco: Never Used  Substance Use Topics  . Alcohol use: Yes    Comment: 4-5 times a week (3 or 4 beers)   Objective:   BP 140/90 (BP Location: Left Arm, Patient Position: Sitting, Cuff Size: Normal)   Pulse 98   Temp 98.2 F (36.8 C) (Oral)   Resp 16   Wt 196 lb (88.9 kg)   SpO2 98%   BMI 33.64 kg/m  Vitals:   12/07/17 1448  BP: 140/90  Pulse: 98  Resp: 16  Temp: 98.2 F (36.8 C)  TempSrc: Oral  SpO2: 98%  Weight: 196 lb (88.9 kg)     Physical Exam  Constitutional: She is oriented to person, place, and time. She appears well-developed and well-nourished. No distress.  HENT:  Right Ear: External ear normal.  Left Ear: External ear normal.  Nose: Right sinus exhibits maxillary sinus tenderness and frontal sinus tenderness. Left sinus exhibits  maxillary sinus tenderness and frontal sinus tenderness.  Mouth/Throat: Oropharynx is clear and moist. No oropharyngeal exudate, posterior oropharyngeal edema or posterior oropharyngeal erythema.  Tms opaque bilaterally   Eyes: Conjunctivae are normal. Right eye exhibits no discharge. Left eye exhibits no discharge.  Neck: Neck supple.  Cardiovascular: Normal rate and regular rhythm.  Pulmonary/Chest: Effort normal. She has wheezes.  Lymphadenopathy:    She has cervical adenopathy.  Neurological: She is alert and oriented to person, place, and time.  Skin: Skin is warm and dry. She is not diaphoretic.  Psychiatric: She has a normal mood and affect. Her behavior is normal.        Assessment & Plan:     1. Bronchitis  Long time smoker, likely has underlying COPD and will treat as such. Have her follow up in office for physical to establish maintenance care and do pulmonary function testing.   - doxycycline (VIBRA-TABS) 100 MG tablet; Take 1 tablet (100 mg total) by mouth 2 (two) times daily for 7 days.  Dispense: 14 tablet; Refill: 0 - predniSONE (DELTASONE) 20 MG tablet; Take 1 tablet (20 mg total) by mouth daily with breakfast for 5 days.  Dispense: 5 tablet; Refill: 0 - albuterol (PROVENTIL HFA;VENTOLIN HFA) 108 (90 Base) MCG/ACT inhaler; Inhale 2 puffs into the lungs every 6 (six) hours  as needed for wheezing or shortness of breath.  Dispense: 1 Inhaler; Refill: 2  2. Tobacco abuse  Return in about 1 month (around 01/07/2018) for CPE.  The entirety of the information documented in the History of Present Illness, Review of Systems and Physical Exam were personally obtained by me. Portions of this information were initially documented by Rondel Baton, CMA and reviewed by me for thoroughness and accuracy.          Trey Sailors, PA-C  [facility]

## 2017-12-07 NOTE — Patient Instructions (Signed)

## 2017-12-10 ENCOUNTER — Telehealth: Payer: Self-pay | Admitting: Physician Assistant

## 2017-12-10 DIAGNOSIS — R05 Cough: Secondary | ICD-10-CM

## 2017-12-10 DIAGNOSIS — R059 Cough, unspecified: Secondary | ICD-10-CM

## 2017-12-10 MED ORDER — BENZONATATE 100 MG PO CAPS: 100.0000 mg | ORAL_CAPSULE | Freq: Three times a day (TID) | ORAL | 0 refills | Status: AC | PRN

## 2017-12-10 NOTE — Telephone Encounter (Signed)
I will send her in some tessalon perles.

## 2017-12-10 NOTE — Telephone Encounter (Signed)
Zyrtec would be fine. We do not have samples of albuterol.

## 2017-12-10 NOTE — Telephone Encounter (Signed)
Patient called back and was advised that Tamara Hoffman perles had been sent into the pharmacy. Patient agrees to try them.  Patient wants to know if she could take OTC Zyrtec? Also, patient mentioned that she was not able to get the albuterol inhaler filled due to cost. She wants to know if we have any sample inhalers?

## 2017-12-10 NOTE — Telephone Encounter (Signed)
I tried calling patient to find out what her question was regarding the inhaler. No answer. Left message to call back. Please advise about patients request for cough syrup.

## 2017-12-10 NOTE — Telephone Encounter (Signed)
Pt stated she had OV with Adriana on 12/07/17 and isn't feeling much better. Pt stated that the cough is keeping her up. Pt is requesting cough syrup Rx be sent to [facility] and would also like a CMA to return her call because she has a question about the inhaler. Please advise. Thanks TNP

## 2017-12-13 NOTE — Telephone Encounter (Signed)
Na. Voice mail not set up 

## 2017-12-24 ENCOUNTER — Encounter: Payer: Self-pay | Admitting: Physician Assistant

## 2018-02-18 ENCOUNTER — Ambulatory Visit
Admission: RE | Admit: 2018-02-18 | Discharge: 2018-02-18 | Disposition: A | Discharge status: Home / Self Care | Payer: BLUE CROSS/BLUE SHIELD | Attending: Family Medicine | Admitting: Family Medicine

## 2018-02-18 ENCOUNTER — Encounter: Payer: Self-pay | Admitting: Family Medicine

## 2018-02-18 ENCOUNTER — Ambulatory Visit: Payer: Self-pay | Admitting: Family Medicine

## 2018-02-18 ENCOUNTER — Ambulatory Visit
Admission: RE | Admit: 2018-02-18 | Discharge: 2018-02-18 | Disposition: A | Discharge status: Home / Self Care | Payer: BLUE CROSS/BLUE SHIELD | Source: Ambulatory Visit | Attending: Family Medicine | Admitting: Family Medicine

## 2018-02-18 ENCOUNTER — Telehealth: Payer: Self-pay

## 2018-02-18 ENCOUNTER — Ambulatory Visit: Payer: BLUE CROSS/BLUE SHIELD | Admitting: Family Medicine

## 2018-02-18 VITALS — BP 128/84 | HR 83 | Temp 98.2°F | Resp 16 | Wt 198.0 lb

## 2018-02-18 DIAGNOSIS — L723 Sebaceous cyst: Secondary | ICD-10-CM

## 2018-02-18 DIAGNOSIS — M19041 Primary osteoarthritis, right hand: Secondary | ICD-10-CM | POA: Insufficient documentation

## 2018-02-18 DIAGNOSIS — M19042 Primary osteoarthritis, left hand: Secondary | ICD-10-CM

## 2018-02-18 MED ORDER — PREDNISONE 5 MG PO TABS: 5.0000 mg | ORAL_TABLET | Freq: Every day | ORAL | 0 refills | Status: DC

## 2018-02-18 NOTE — Progress Notes (Signed)
Patient: Tamara Hoffman Female    DOB: 06/05/1965   52 y.o.   MRN: [id number] Visit Date: 02/18/2018  Today's Provider: Dortha Kernennis Ferguson Gertner, PA   Chief Complaint  Patient presents with  . Hand Pain   Subjective:     Hand Pain   The incident occurred more than 1 week ago (1 year). There was no injury mechanism. The pain is present in the right hand. The quality of the pain is described as aching. The pain radiates to the right hand. The pain is at a severity of 8/10. The pain is severe. The pain has been intermittent since the incident. Associated symptoms include muscle weakness, numbness and tingling. Pertinent negatives include no chest pain. The symptoms are aggravated by movement. She has tried ice (BC powders ) for the symptoms. The treatment provided mild relief.   Patient states she has had right hand pain for 1 year. Patient states pain has worsened. Patient states she has swelling, tingling and numbness in right hand. Patient also states there is slight pain in the left hand. [facility] and uses ice for hands but only has mild relief.   Past Medical History:  Diagnosis Date  . GERD (gastroesophageal reflux disease)   . Hypertension    Past Surgical History:  Procedure Laterality Date  . LEEP     Family History  Problem Relation Age of Onset  . Diabetes Mother   . Hypertension Father   . Heart disease Father   . Diabetes Sister   . Cancer Maternal Grandmother    Allergies  Allergen Reactions  . Phenergan [Promethazine Hcl] Swelling    Current Outpatient Medications:  .  albuterol (PROVENTIL HFA;VENTOLIN HFA) 108 (90 Base) MCG/ACT inhaler, Inhale 2 puffs into the lungs every 6 (six) hours as needed for wheezing or shortness of breath., Disp: 1 Inhaler, Rfl: 2 .  pantoprazole (PROTONIX) 40 MG tablet, Take 1 tablet (40 mg total) by mouth 2 (two) times daily., Disp: 60 tablet, Rfl: 2  Review of Systems  Constitutional: Negative for appetite change,  chills, fatigue and fever.  Respiratory: Negative for chest tightness and shortness of breath.   Cardiovascular: Negative for chest pain and palpitations.  Gastrointestinal: Negative for abdominal pain, nausea and vomiting.  Neurological: Positive for tingling and numbness. Negative for dizziness and weakness.   Social History   Tobacco Use  . Smoking status: Current Every Day Smoker    Packs/day: 0.50    Types: Cigarettes  . Smokeless tobacco: Never Used  Substance Use Topics  . Alcohol use: Yes    Comment: 4-5 times a week (3 or 4 beers)     Objective:   BP 128/84 (BP Location: Right Arm, Patient Position: Sitting, Cuff Size: Large)   Pulse 83   Temp 98.2 F (36.8 C) (Oral)   Resp 16   Wt 198 lb (89.8 kg)   SpO2 99%   BMI 33.99 kg/m  Vitals:   02/18/18 1102  BP: 128/84  Pulse: 83  Resp: 16  Temp: 98.2 F (36.8 C)  TempSrc: Oral  SpO2: 99%  Weight: 198 lb (89.8 kg)   Physical Exam Constitutional:      General: She is not in acute distress.    Appearance: She is well-developed.  HENT:     Head: Normocephalic and atraumatic.     Right Ear: Hearing normal.     Left Ear: Hearing normal.     Nose: Nose normal.  Eyes:     General: Lids are normal. No scleral icterus.       Right eye: No discharge.        Left eye: No discharge.     Conjunctiva/sclera: Conjunctivae normal.  Pulmonary:     Effort: Pulmonary effort is normal. No respiratory distress.  Musculoskeletal:        General: Swelling, tenderness and deformity present.     Comments: Tender and enlarged PIP joints and MCP joints of the right middle and ring fingers. Some ache and enlargement enlargement of left index finger. Decreased grip strength right hand. Stiffness and pain worse in early mornings.  Skin:    Findings: No lesion or rash.     Comments: 1.5 cm sebaceous/epidermal cyst posterior upper back at the base of the C-spine. Not tender or erythematous.  Neurological:     Mental Status: She is  alert and oriented to person, place, and time.  Psychiatric:        Speech: Speech normal.        Behavior: Behavior normal.        Thought Content: Thought content normal.       Assessment & Plan    1. 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 Stiffness, pain and swelling worse in the early mornings. Has tried BC's with slight help but seems to be worsening. Will check labs and x-rays to evaluate joints. Suspect RA versus OA or mixture. May apply moist heat and will give prednisone taper over 6 days. Recheck pending lab and x-ray reports. May need referral to rheumatologist. - CBC with Differential/Platelet - Sedimentation rate - Rheumatoid Arthritis Profile - DG Hand Complete Right - DG Hand Complete Left - predniSONE (DELTASONE) 5 MG tablet; Take 1 tablet (5 mg total) by mouth daily with breakfast. Taper by one tablet daily starting at 6 today (6,5,4,3,2,1)  Dispense: 21 tablet; Refill: 0  2. Sebaceous cyst Approximately 1.5 cm cyst posterior base of neck at the midline. Feels it is getting larger but no pain or drainage. Recommend surgical referral but will wait until later in January or February.     Dortha Kernennis Harley Fitzwater, PA  [facility]

## 2018-02-18 NOTE — Telephone Encounter (Signed)
-----   Message from Jodell Ciproennis E Blue Lakehrismon, [place] sent at 02/18/2018  4:05 PM EST ----- Minimal arthritis of the left 2nd DIP and PIP joints. Mild joint space narrowing and osteophytes of the right 2nd - 4th PIP joints with diffuse soft tissue swelling. Awaiting labs to check for rheumatoid arthritis.

## 2018-02-18 NOTE — Telephone Encounter (Signed)
Patient has been advised. KW 

## 2018-02-20 LAB — CBC WITH DIFFERENTIAL/PLATELET
Basophils Absolute: 0.1 10*3/uL (ref 0.0–0.2)
Basos: 1 %
EOS (ABSOLUTE): 0.2 10*3/uL (ref 0.0–0.4)
EOS: 4 %
HEMATOCRIT: 41.4 % (ref 34.0–46.6)
Hemoglobin: 13.9 g/dL (ref 11.1–15.9)
IMMATURE GRANULOCYTES: 0 %
Immature Grans (Abs): 0 10*3/uL (ref 0.0–0.1)
Lymphocytes Absolute: 1.7 10*3/uL (ref 0.7–3.1)
Lymphs: 27 %
MCH: 31.1 pg (ref 26.6–33.0)
MCHC: 33.6 g/dL (ref 31.5–35.7)
MCV: 93 fL (ref 79–97)
MONOS ABS: 0.6 10*3/uL (ref 0.1–0.9)
Monocytes: 10 %
Neutrophils Absolute: 3.7 10*3/uL (ref 1.4–7.0)
Neutrophils: 58 %
PLATELETS: 284 10*3/uL (ref 150–450)
RBC: 4.47 x10E6/uL (ref 3.77–5.28)
RDW: 13.1 % (ref 12.3–15.4)
WBC: 6.4 10*3/uL (ref 3.4–10.8)

## 2018-02-20 LAB — RHEUMATOID ARTHRITIS PROFILE
CYCLIC CITRULLIN PEPTIDE AB: 7 U (ref 0–19)
Rheumatoid fact SerPl-aCnc: <10 IU/mL (ref 0.0–13.9)

## 2018-02-20 LAB — SEDIMENTATION RATE: Sed Rate: 17 mm/hr (ref 0–40)

## 2018-02-21 ENCOUNTER — Telehealth: Payer: Self-pay | Admitting: *Deleted

## 2018-02-21 NOTE — Telephone Encounter (Signed)
LMOVM for pt to return call 

## 2018-02-21 NOTE — Telephone Encounter (Signed)
Can send the prescription to her pharmacy - which one? She should start it 7-10 days before totally stopping all smoking and continue it for 12 weeks. Recheck progress in a month.

## 2018-02-21 NOTE — Telephone Encounter (Signed)
Patient is requesting an rx for Chantix? Please advise?

## 2018-03-14 ENCOUNTER — Telehealth: Payer: Self-pay | Admitting: Family Medicine

## 2018-03-14 NOTE — Telephone Encounter (Signed)
Need to look at ears to document reason for referral to be sure insurance will cover this issue.

## 2018-03-14 NOTE — Telephone Encounter (Signed)
Patient still has really bad hearing problems and is getting worse.  Can barely hear out of L ear. Patient wants to be referred to ENT.

## 2018-03-14 NOTE — Telephone Encounter (Signed)
Please Review

## 2018-03-14 NOTE — Telephone Encounter (Signed)
Scheduled for Thursday at 3:40.

## 2018-03-15 ENCOUNTER — Other Ambulatory Visit: Payer: Self-pay | Admitting: Family Medicine

## 2018-03-15 DIAGNOSIS — Z720 Tobacco use: Secondary | ICD-10-CM

## 2018-03-15 MED ORDER — VARENICLINE TARTRATE 0.5 MG X 11 & 1 MG X 42 PO MISC: ORAL | 0 refills | Status: DC

## 2018-03-15 NOTE — Telephone Encounter (Signed)
Patient would like Chantix rx sent to [facility].

## 2018-03-15 NOTE — Telephone Encounter (Signed)
Done. She should start it 7-10 days before totally stopping all smoking and continue it for 12 weeks. Recheck appointment to gauge progress in a month.

## 2018-03-15 NOTE — Progress Notes (Signed)
Ready to begin Chantix and quit smoking. She should start it 7-10 days before totally stopping all smoking and continue it for 12 weeks. Recheck progress in a month.

## 2018-03-16 NOTE — Telephone Encounter (Signed)
No answer and no vm

## 2018-03-17 ENCOUNTER — Ambulatory Visit: Payer: Self-pay | Admitting: Family Medicine

## 2018-03-23 NOTE — Telephone Encounter (Signed)
No answer and no vm

## 2018-04-18 ENCOUNTER — Encounter: Payer: Self-pay | Admitting: Family Medicine

## 2018-04-18 ENCOUNTER — Ambulatory Visit: Payer: BLUE CROSS/BLUE SHIELD | Admitting: Family Medicine

## 2018-04-18 VITALS — BP 138/100 | HR 96 | Temp 98.0°F | Resp 16 | Wt 199.0 lb

## 2018-04-18 DIAGNOSIS — H833X3 Noise effects on inner ear, bilateral: Secondary | ICD-10-CM

## 2018-04-18 DIAGNOSIS — H9311 Tinnitus, right ear: Secondary | ICD-10-CM | POA: Diagnosis not present

## 2018-04-18 DIAGNOSIS — H6981 Other specified disorders of Eustachian tube, right ear: Secondary | ICD-10-CM | POA: Diagnosis not present

## 2018-04-18 NOTE — Progress Notes (Signed)
Patient: Tamara Hoffman Female    DOB: 1965-06-01   53 y.o.   MRN: [id number] Visit Date: 04/18/2018  Today's Provider: Dortha Kern, PA   Chief Complaint  Patient presents with  . Hearing Problem   Subjective:     HPI  Patient states she has had some hearing loss in the right ear for a few years. Patient states lately she is having trouble hearing out of both ears. Patient states ears are buzzing.   Past Medical History:  Diagnosis Date  . GERD (gastroesophageal reflux disease)   . Hypertension    Past Surgical History:  Procedure Laterality Date  . LEEP     Family History  Problem Relation Age of Onset  . Diabetes Mother   . Hypertension Father   . Heart disease Father   . Diabetes Sister   . Cancer Maternal Grandmother    Allergies  Allergen Reactions  . Phenergan [Promethazine Hcl] Swelling    Current Outpatient Medications:  .  albuterol (PROVENTIL HFA;VENTOLIN HFA) 108 (90 Base) MCG/ACT inhaler, Inhale 2 puffs into the lungs every 6 (six) hours as needed for wheezing or shortness of breath. (Patient not taking: Reported on 04/18/2018), Disp: 1 Inhaler, Rfl: 2 .  pantoprazole (PROTONIX) 40 MG tablet, Take 1 tablet (40 mg total) by mouth 2 (two) times daily. (Patient not taking: Reported on 04/18/2018), Disp: 60 tablet, Rfl: 2 .  predniSONE (DELTASONE) 5 MG tablet, Take 1 tablet (5 mg total) by mouth daily with breakfast. Taper by one tablet daily starting at 6 today (6,5,4,3,2,1) (Patient not taking: Reported on 04/18/2018), Disp: 21 tablet, Rfl: 0 .  varenicline (CHANTIX STARTING MONTH PAK) 0.5 MG X 11 & 1 MG X 42 tablet, Take one 0.5 mg tablet by mouth once daily for 3 days, then increase to one 0.5 mg tablet twice daily for 4 days, then increase to one 1 mg tablet twice daily. (Patient not taking: Reported on 04/18/2018), Disp: 53 tablet, Rfl: 0  Review of Systems  Constitutional: Negative for appetite change, chills, fatigue and fever.  Respiratory:  Negative for chest tightness and shortness of breath.   Cardiovascular: Negative for chest pain and palpitations.  Gastrointestinal: Negative for abdominal pain, nausea and vomiting.  Neurological: Negative for dizziness and weakness.   Social History   Tobacco Use  . Smoking status: Current Every Day Smoker    Packs/day: 0.50    Types: Cigarettes  . Smokeless tobacco: Never Used  Substance Use Topics  . Alcohol use: Yes    Comment: 4-5 times a week (3 or 4 beers)     Objective:   BP (!) 138/100 (BP Location: Right Arm, Patient Position: Sitting, Cuff Size: Large)   Pulse 96   Temp 98 F (36.7 C) (Oral)   Resp 16   Wt 199 lb (90.3 kg)   SpO2 99%   BMI 34.16 kg/m  Vitals:   04/18/18 1528  BP: (!) 138/100  Pulse: 96  Resp: 16  Temp: 98 F (36.7 C)  TempSrc: Oral  SpO2: 99%  Weight: 199 lb (90.3 kg)   Physical Exam Constitutional:      General: She is not in acute distress.    Appearance: She is well-developed.  HENT:     Head: Normocephalic and atraumatic.     Right Ear: Hearing, tympanic membrane and ear canal normal.     Left Ear: Hearing, tympanic membrane and ear canal normal.     Nose: Nose  normal.     Mouth/Throat:     Pharynx: Oropharynx is clear.  Eyes:     General: Lids are normal. No scleral icterus.       Right eye: No discharge.        Left eye: No discharge.     Conjunctiva/sclera: Conjunctivae normal.  Pulmonary:     Effort: Pulmonary effort is normal. No respiratory distress.  Musculoskeletal: Normal range of motion.  Skin:    Findings: No lesion or rash.  Neurological:     Mental Status: She is alert and oriented to person, place, and time.  Psychiatric:        Speech: Speech normal.        Behavior: Behavior normal.        Thought Content: Thought content normal.       Assessment & Plan    1. Tinnitus of right ear Onset over the past few year and more noticeable the past few weeks. Has worked in loud environments in the past.  Describes noise as a buzzing/ringing in the ears (R>L). May use Lipo-Flavonoids qd and recheck if no better in a month.  2. Dysfunction of right eustachian tube Slight stopped up sensation in the right ear. No dizziness. May use Fluticasone Nasal Spray at bedtime with OTC antihistamine prn. No fluid lines by has a stopped up sensation as if she had taken a trip to the mountains. Recheck prn.  3. Noise-induced hearing loss of both ears Worked in loud environments for many years. Screening of hearing shows decreased hearing in the right ear at all frequencies. Work on eustachian dysfunction and may need referral to ENT/audiologist if no better in a month with use of Flonase and Lipo-Flavonoids.     Dortha Kern, PA  [facility]

## 2018-04-20 ENCOUNTER — Other Ambulatory Visit: Payer: Self-pay | Admitting: *Deleted

## 2018-04-20 DIAGNOSIS — Z720 Tobacco use: Secondary | ICD-10-CM

## 2018-04-20 NOTE — Telephone Encounter (Signed)
Patient was advised.  

## 2018-04-20 NOTE — Telephone Encounter (Signed)
Patient is requesting Chantix be resent to [facility] and [facility]?

## 2018-04-21 ENCOUNTER — Other Ambulatory Visit: Payer: Self-pay | Admitting: Family Medicine

## 2018-04-21 DIAGNOSIS — Z720 Tobacco use: Secondary | ICD-10-CM

## 2018-04-21 MED ORDER — VARENICLINE TARTRATE 1 MG PO TABS: 1.0000 mg | ORAL_TABLET | Freq: Two times a day (BID) | ORAL | 2 refills | Status: DC

## 2018-06-02 ENCOUNTER — Other Ambulatory Visit: Payer: Self-pay

## 2018-06-02 ENCOUNTER — Telehealth: Payer: Self-pay

## 2018-06-02 ENCOUNTER — Ambulatory Visit (INDEPENDENT_AMBULATORY_CARE_PROVIDER_SITE_OTHER): Payer: BLUE CROSS/BLUE SHIELD | Admitting: Family Medicine

## 2018-06-02 ENCOUNTER — Encounter: Payer: Self-pay | Admitting: Family Medicine

## 2018-06-02 DIAGNOSIS — J301 Allergic rhinitis due to pollen: Secondary | ICD-10-CM | POA: Diagnosis not present

## 2018-06-02 DIAGNOSIS — R05 Cough: Secondary | ICD-10-CM

## 2018-06-02 MED ORDER — AMOXICILLIN 875 MG PO TABS: 875.0000 mg | ORAL_TABLET | Freq: Two times a day (BID) | ORAL | 0 refills | Status: DC

## 2018-06-02 NOTE — Telephone Encounter (Signed)
Patient requesting medication called into pharmacy for head congestion. Patient reports that her phone is too old and was not able to down load the webex app. Please advise. sd

## 2018-06-02 NOTE — Telephone Encounter (Signed)
Provider will call patient.

## 2018-06-02 NOTE — Progress Notes (Signed)
This 53 year old female developed stuffy head, rhinorrhea, sneezing and cough 05-30-18. Feels it started after heavy yard work 05-28-18 and 05-29-18 at home. Started like her usual pollen allergies and responded to Zyrtec, Sudafed and Chloroseptic. No further rhinorrhea and less cough (no shortness of breath) with the above medications. Today has a swollen sore throat with enlarged uvula and PND. Feeling "warm", but unable to find her thermometer to document temperature. Concerned about possible strep pharyngitis. Unable to accomplish Webex virtual visit due to incompatible phone equipment at home. Will treat Uvulitis and Allergic Rhinitis with Amoxil 875 mg BID#20, Flonase Nasal Spray, Zyrtec, Mucinex-DM (for cough) and warm saltwater gargles prn. Stay home restrictions and call report of throat symptoms tomorrow (may need prednisone). If having difficulty swallowing her own saliva or more breathing difficulties, may have to go to ER to check for epiglottitis. Speech has a little "hot potato" sound over the telephone.

## 2018-07-19 ENCOUNTER — Telehealth: Payer: Self-pay

## 2018-07-19 NOTE — Telephone Encounter (Signed)
Please review

## 2018-07-19 NOTE — Telephone Encounter (Signed)
Had recommended a surgical referral for options of excision of this sebaceous cyst. If it has become painful and red, it may need antibiotic treatment before excision. Someone would have to see it to know which treatment option is best.

## 2018-07-19 NOTE — Telephone Encounter (Signed)
Patient called saying that she has a cyst on the back of her neck that seems to have gotten bigger. She reports that you have seen her for this a few months ago. She wanted to know can you drain the cyst, or does she need to be referred to a specialist? Please advise. Contact info is correct. Thanks!

## 2018-07-19 NOTE — Telephone Encounter (Signed)
Patient advised and due to the schedules as a result of the COVID virus she said she would wait a few months as there is not anything going on with the cyst currently, she just needed to know if it was something that could be done here or at the surgeons.

## 2018-07-28 ENCOUNTER — Ambulatory Visit (INDEPENDENT_AMBULATORY_CARE_PROVIDER_SITE_OTHER): Payer: BLUE CROSS/BLUE SHIELD | Admitting: Family Medicine

## 2018-07-28 ENCOUNTER — Other Ambulatory Visit: Payer: Self-pay

## 2018-07-28 ENCOUNTER — Encounter: Payer: Self-pay | Admitting: Family Medicine

## 2018-07-28 DIAGNOSIS — J301 Allergic rhinitis due to pollen: Secondary | ICD-10-CM | POA: Diagnosis not present

## 2018-07-28 DIAGNOSIS — J40 Bronchitis, not specified as acute or chronic: Secondary | ICD-10-CM | POA: Diagnosis not present

## 2018-07-28 MED ORDER — AZITHROMYCIN 250 MG PO TABS: ORAL_TABLET | ORAL | 0 refills | Status: DC

## 2018-07-28 MED ORDER — PREDNISONE 10 MG (21) PO TBPK: ORAL_TABLET | ORAL | 0 refills | Status: DC

## 2018-07-28 NOTE — Progress Notes (Signed)
Virtual Visit via Telephone Note  I connected with Tamara Hoffman on 07/28/18 at  3:20 PM EDT by telephone and verified that I am speaking with the correct person using two identifiers.  Location: Patient: Home Provider: Office   I discussed the limitations, risks, security and privacy concerns of performing an evaluation and management service by telephone and the availability of in person appointments. I also discussed with the patient that there may be a patient responsible charge related to this service. The patient expressed understanding and agreed to proceed.   History of Present Illness:  This 53 year old female states she has had a non-productive cough, sneezing, rhinorrhea, throat irritation, PND and general malaise with fatigue over the past week. Initial 2 days she had temperature up to 99.1 but it is 98 today. States symptoms may have come from going back to work 5-6 weeks ago. Tried to use Nyquil but it made her feel "terrible". Continues to smoke 3-4 cigarettes a day and has not had to use her Proventil-HFA very much. Has restarted Flonase QOD but very little use of Zyrtec. Noticed some constipation but had diarrhea when she used MOM (it caused some stomach cramps, also).     Observations/Objective: Past Medical History:  Diagnosis Date  . GERD (gastroesophageal reflux disease)   . Hypertension    Past Surgical History:  Procedure Laterality Date  . LEEP     Allergies  Allergen Reactions  . Phenergan [Promethazine Hcl] Swelling   Current Outpatient Medications on File Prior to Visit  Medication Sig Dispense Refill  . albuterol (PROVENTIL HFA;VENTOLIN HFA) 108 (90 Base) MCG/ACT inhaler Inhale 2 puffs into the lungs every 6 (six) hours as needed for wheezing or shortness of breath. (Patient not taking: Reported on 04/18/2018) 1 Inhaler 2  . varenicline (CHANTIX) 1 MG tablet Take 1 tablet (1 mg total) by mouth 2 (two) times daily. 60 tablet 2   No current  facility-administered medications on file prior to visit.    Assessment and Plan: 1. Seasonal allergic rhinitis due to pollen Sound as if she has a stuffy nose and describes sneezing with clear rhinorrhea. Will give prednisone taper with poor relief from Flonase and Zyrtec. Increase fluid intake and eat 3 meals a day. - predniSONE (STERAPRED UNI-PAK 21 TAB) 10 MG (21) TBPK tablet; Take taper by mouth as directed on package (6,5,4,3,2,1).  Dispense: 21 tablet; Refill: 0  2. Bronchitis Cough without sputum production. May switch to Mucinex with Delsym and add prednisone taper with Azithromycin. Reminded her to isolate at home for the next 5-7 days and free for fever for 72 hours without taking an antipyretic. If fatigue worsens, fever gets above 100.5 or becomes more short of breath, she may need to go to the ER for COVID-19 testing. She will monitor her temperature, wash her hands frequently and stay home. Call report of progress and temperature readings in 3-4 days. Patient understands and agrees with plan. Does not want to go to the ER if possible. - azithromycin (ZITHROMAX) 250 MG tablet; Take 2 tablets by mouth today then one daily for 4 days.  Dispense: 6 tablet; Refill: 0 - predniSONE (STERAPRED UNI-PAK 21 TAB) 10 MG (21) TBPK tablet; Take taper by mouth as directed on package (6,5,4,3,2,1).  Dispense: 21 tablet; Refill: 0   Follow Up Instructions:    I discussed the assessment and treatment plan with the patient. The patient was provided an opportunity to ask questions and all were answered. The patient  agreed with the plan and demonstrated an understanding of the instructions.   The patient was advised to call back or seek an in-person evaluation if the symptoms worsen or if the condition fails to improve as anticipated.  I provided 11 minutes of non-face-to-face time during this encounter.   Dortha Kern, PA

## 2018-08-01 ENCOUNTER — Telehealth: Payer: Self-pay

## 2018-08-01 ENCOUNTER — Other Ambulatory Visit: Payer: Self-pay | Admitting: Family Medicine

## 2018-08-01 DIAGNOSIS — Z20822 Contact with and (suspected) exposure to covid-19: Secondary | ICD-10-CM

## 2018-08-01 NOTE — Telephone Encounter (Addendum)
Patient called and advised of the request for covid testing, she verbalized understanding, appointment scheduled for tomorrow, 08/02/18 at 0800, advised of location and to wear a mask, she verbalized understanding.   ----- Message from Tamsen Roers, PA sent at 08/01/2018  1:52 PM EDT ----- Cough, slight shortness of breath with low grade fever and nasal congestion.

## 2018-08-01 NOTE — Telephone Encounter (Signed)
Please review

## 2018-08-01 NOTE — Telephone Encounter (Signed)
Routed order to [facility] and [facility]. Is suppose they will notify the patient as to when to go for testing.

## 2018-08-01 NOTE — Telephone Encounter (Signed)
Pt called back saying she is feeling better with the zpak but her employer is still wanting her to have the covid test done.  CB#  [phone]  Thanks Barth Kirks

## 2018-08-02 ENCOUNTER — Other Ambulatory Visit: Payer: BLUE CROSS/BLUE SHIELD

## 2018-08-02 DIAGNOSIS — Z20822 Contact with and (suspected) exposure to covid-19: Secondary | ICD-10-CM

## 2018-08-02 DIAGNOSIS — R6889 Other general symptoms and signs: Secondary | ICD-10-CM | POA: Diagnosis not present

## 2018-08-03 LAB — NOVEL CORONAVIRUS, NAA: SARS-CoV-2, NAA: NOT DETECTED

## 2018-08-05 ENCOUNTER — Telehealth: Payer: Self-pay

## 2018-08-05 NOTE — Telephone Encounter (Signed)
Printed copy of negative test results that she can come by the office to pick up. We don't share this confidential information with anyone other than the patient.

## 2018-08-05 NOTE — Telephone Encounter (Signed)
Patient states that she needs a letter saying that she is negative for COVID, emailed to her employer [email or web address].

## 2018-08-05 NOTE — Telephone Encounter (Signed)
Copy left up front. Tried calling patient and no answer. Will try again later.

## 2018-09-29 ENCOUNTER — Telehealth: Payer: Self-pay | Admitting: Family Medicine

## 2018-09-29 NOTE — Telephone Encounter (Signed)
Pt wants to see Tamara Hoffman for the following:  Gained weight -  wanting a pap smear Some vaginal burning - really itchy - 2 pimples   Pt was prescreened with the following:  Head and chest congestion - due to smoking Sore throat  Please call pt affter 3 pm today to discuss possible visit.  Thanks, [employer]

## 2018-10-03 ENCOUNTER — Other Ambulatory Visit: Payer: Self-pay

## 2018-10-03 ENCOUNTER — Encounter: Payer: Self-pay | Admitting: Physician Assistant

## 2018-10-03 ENCOUNTER — Ambulatory Visit: Payer: BLUE CROSS/BLUE SHIELD | Admitting: Physician Assistant

## 2018-10-03 VITALS — BP 210/120 | HR 81 | Temp 98.8°F | Resp 16 | Wt 208.2 lb

## 2018-10-03 DIAGNOSIS — I10 Essential (primary) hypertension: Secondary | ICD-10-CM

## 2018-10-03 DIAGNOSIS — F1010 Alcohol abuse, uncomplicated: Secondary | ICD-10-CM

## 2018-10-03 DIAGNOSIS — F321 Major depressive disorder, single episode, moderate: Secondary | ICD-10-CM

## 2018-10-03 DIAGNOSIS — G479 Sleep disorder, unspecified: Secondary | ICD-10-CM | POA: Diagnosis not present

## 2018-10-03 DIAGNOSIS — A630 Anogenital (venereal) warts: Secondary | ICD-10-CM

## 2018-10-03 MED ORDER — AMLODIPINE BESYLATE 10 MG PO TABS: 10.0000 mg | ORAL_TABLET | Freq: Every day | ORAL | 3 refills | Status: DC

## 2018-10-03 MED ORDER — TRAZODONE HCL 50 MG PO TABS: 25.0000 mg | ORAL_TABLET | Freq: Every evening | ORAL | 3 refills | Status: DC | PRN

## 2018-10-03 MED ORDER — PODOFILOX 0.5 % EX SOLN: Freq: Two times a day (BID) | CUTANEOUS | 0 refills | Status: DC

## 2018-10-03 NOTE — Patient Instructions (Signed)
Podofilox topical solution What is this medicine? PODOFILOX (po do FIL ox) is a medication used to remove genital warts. The topical solution should only be used to treat genital warts located on the outside skin of the genitals (i.e., penis or vagina). The solution should not be used to treat warts near the rectal area. This medicine may be used for other purposes; ask your health care provider or pharmacist if you have questions. COMMON BRAND NAME(S): Condylox What should I tell my health care provider before I take this medicine? They need to know if you have any of these conditions:  an unusual or allergic reaction to podofilox, podophyllum resin, other medicines, foods, dyes or preservatives  pregnant or trying to get pregnant  breast-feeding How should I use this medicine? This medicine is for external use only on the skin. Do not take by mouth. The solution may be used on the outside (external) areas of the skin around the vagina or penis to remove warts. The solution should not be used in the area between the rectum and the genitals. The solution should only be used on the outside skin. It should not be used to treat warts that are inside the rectum, vagina, or penis. Do not use this medicine more often or for longer than directed. Do not use a larger dose than directed by your health care professional. Wash hands before and after use. Read package directions carefully before using. Apply the medication to the specific wart as instructed by your physician. Apply the solution using the applicator provided or a cotton-tipped applicator. Applicators should not be re-used. Additionally, used applicators should not be dipped into the bottle to prevent contamination. After application, you should make sure that the medication is dry before normal, untreated skin comes into contact with the treated skin. This medicine can cause severe irritation of normal skin. If contact with normal skin occurs,  immediately flush the area thoroughly with water. Talk to your pediatrician regarding the use of this medicine in children. Special care may be needed. Overdosage: If you think you have taken too much of this medicine contact a poison control center or emergency room at once. NOTE: This medicine is only for you. Do not share this medicine with others. What if I miss a dose? If you miss a dose, take it as soon as you can. If it is almost time for your next dose, take only that dose. Do not take double or extra doses. What may interact with this medicine? Interactions are not expected. Do not use any other medicines on the affected area without asking your doctor or health care professional. This list may not describe all possible interactions. Give your health care provider a list of all the medicines, herbs, non-prescription drugs, or dietary supplements you use. Also tell them if you smoke, drink alcohol, or use illegal drugs. Some items may interact with your medicine. What should I watch for while using this medicine? Visit your doctor or health care professional for regular checks on your progress. This medicine is not a cure. New warts may develop during or after treatment. Tell your doctor or health care professional if your symptoms do not start to get better within one week. The weekly treatment course can be repeated up to 4 times. If the wart does not resolve in 4 weeks, a different treatment should be considered. If you are pregnant or think you might be pregnant, contact your doctor or health care professional. Sexual (genital, oral,  anal) contact should be avoided while the medication is on the skin. The only way to prevent infecting others with the HPV virus (the virus that causes genital warts) is to avoid direct skin-to-skin contact. If warts are visible in the genital area, sexual contact should be avoided until the warts are treated. Experts advise that using latex condoms during  sexual contact may reduce, but not entirely prevent, infecting others. Avoid contact with the eyes. If eye contact occurs, patients should immediately flush the eye with large quantities of water for 15 minutes and seek medical attention. This medicine contains alcohol and is flammable. Do not use near heat, open flame, or while smoking. What side effects may I notice from receiving this medicine? Side effects that you should report to your doctor or health care professional as soon as possible:  bleeding, blistering, burning, crusting, or scabbing of treated skin  blood in the urine  dizziness  severe skin rash or swelling  vomiting (may indicate excessive dosage) Side effects that usually do not require medical attention (report to your doctor or health care professional if they continue or are bothersome):  dryness, flaking or peeling of the skin  headache  mild redness, itching or stinging of the skin This list may not describe all possible side effects. Call your doctor for medical advice about side effects. You may report side effects to FDA at 1-800-FDA-1088. Where should I keep my medicine? Keep out of the reach of children. Store at room temperature between 15 to 30 degrees C (59 to 86 degrees F). Do not freeze. Keep container tightly closed. This medicine contains alcohol and is flammable. Do not store near heat or open flame. Throw away any unused medicine after the expiration date. NOTE: This sheet is a summary. It may not cover all possible information. If you have questions about this medicine, talk to your doctor, pharmacist, or health care provider.  2020 Elsevier/Gold Standard (2007-09-19 16:13:34) Amlodipine tablets What is this medicine? AMLODIPINE (am LOE di peen) is a calcium-channel blocker. It affects the amount of calcium found in your heart and muscle cells. This relaxes your blood vessels, which can reduce the amount of work the heart has to do. This medicine  is used to lower high blood pressure. It is also used to prevent chest pain. This medicine may be used for other purposes; ask your health care provider or pharmacist if you have questions. COMMON BRAND NAME(S): Norvasc What should I tell my health care provider before I take this medicine? They need to know if you have any of these conditions:  heart disease  liver disease  an unusual or allergic reaction to amlodipine, other medicines, foods, dyes, or preservatives  pregnant or trying to get pregnant  breast-feeding How should I use this medicine? Take this medicine by mouth with a glass of water. Follow the directions on the prescription label. You can take it with or without food. If it upsets your stomach, take it with food. Take your medicine at regular intervals. Do not take it more often than directed. Do not stop taking except on your doctor's advice. Talk to your pediatrician regarding the use of this medicine in children. While this drug may be prescribed for children as young as 6 years for selected conditions, precautions do apply. Patients over 77 years of age may have a stronger reaction and need a smaller dose. Overdosage: If you think you have taken too much of this medicine contact a poison control center  or emergency room at once. NOTE: This medicine is only for you. Do not share this medicine with others. What if I miss a dose? If you miss a dose, take it as soon as you can. If it is almost time for your next dose, take only that dose. Do not take double or extra doses. What may interact with this medicine? Do not take this medicine with any of the following medications:  tranylcypromine This medicine may also interact with the following medications:  clarithromycin  cyclosporine  diltiazem  itraconazole  simvastatin  tacrolimus This list may not describe all possible interactions. Give your health care provider a list of all the medicines, herbs,  non-prescription drugs, or dietary supplements you use. Also tell them if you smoke, drink alcohol, or use illegal drugs. Some items may interact with your medicine. What should I watch for while using this medicine? Visit your healthcare professional for regular checks on your progress. Check your blood pressure as directed. Ask your healthcare professional what your blood pressure should be and when you should contact him or her. Do not treat yourself for coughs, colds, or pain while you are using this medicine without asking your healthcare professional for advice. Some medicines may increase your blood pressure. You may get dizzy. Do not drive, use machinery, or do anything that needs mental alertness until you know how this medicine affects you. Do not stand or sit up quickly, especially if you are an older patient. This reduces the risk of dizzy or fainting spells. Avoid alcoholic drinks; they can make you dizzier. What side effects may I notice from receiving this medicine? Side effects that you should report to your doctor or health care professional as soon as possible:  allergic reactions like skin rash, itching or hives; swelling of the face, lips, or tongue  fast, irregular heartbeat  signs and symptoms of low blood pressure like dizziness; feeling faint or lightheaded, falls; unusually weak or tired  swelling of ankles, feet, hands Side effects that usually do not require medical attention (report these to your doctor or health care professional if they continue or are bothersome):  dry mouth  facial flushing  headache  stomach pain  tiredness This list may not describe all possible side effects. Call your doctor for medical advice about side effects. You may report side effects to FDA at 1-800-FDA-1088. Where should I keep my medicine? Keep out of the reach of children. Store at room temperature between 59 and 86 degrees F (15 and 30 degrees C). Throw away any unused  medicine after the expiration date. NOTE: This sheet is a summary. It may not cover all possible information. If you have questions about this medicine, talk to your doctor, pharmacist, or health care provider.  2020 Elsevier/Gold Standard (2017-09-10 15:07:10) Trazodone tablets What is this medicine? TRAZODONE (TRAZ oh done) is used to treat depression. This medicine may be used for other purposes; ask your health care provider or pharmacist if you have questions. COMMON BRAND NAME(S): Desyrel What should I tell my health care provider before I take this medicine? They need to know if you have any of these conditions:  attempted suicide or thinking about it  bipolar disorder  bleeding problems  glaucoma  heart disease, or previous heart attack  irregular heart beat  kidney or liver disease  low levels of sodium in the blood  an unusual or allergic reaction to trazodone, other medicines, foods, dyes or preservatives  pregnant or trying to  get pregnant  breast-feeding How should I use this medicine? Take this medicine by mouth with a glass of water. Follow the directions on the prescription label. Take this medicine shortly after a meal or a light snack. Take your medicine at regular intervals. Do not take your medicine more often than directed. Do not stop taking this medicine suddenly except upon the advice of your doctor. Stopping this medicine too quickly may cause serious side effects or your condition may worsen. A special MedGuide will be given to you by the pharmacist with each prescription and refill. Be sure to read this information carefully each time. Talk to your pediatrician regarding the use of this medicine in children. Special care may be needed. Overdosage: If you think you have taken too much of this medicine contact a poison control center or emergency room at once. NOTE: This medicine is only for you. Do not share this medicine with others. What if I miss a  dose? If you miss a dose, take it as soon as you can. If it is almost time for your next dose, take only that dose. Do not take double or extra doses. What may interact with this medicine? Do not take this medicine with any of the following medications:  certain medicines for fungal infections like fluconazole, itraconazole, ketoconazole, posaconazole, voriconazole  cisapride  dronedarone  linezolid  MAOIs like Carbex, Eldepryl, Marplan, Nardil, and Parnate  mesoridazine  methylene blue (injected into a vein)  pimozide  saquinavir  thioridazine This medicine may also interact with the following medications:  alcohol  antiviral medicines for HIV or AIDS  aspirin and aspirin-like medicines  barbiturates like phenobarbital  certain medicines for blood pressure, heart disease, irregular heart beat  certain medicines for depression, anxiety, or psychotic disturbances  certain medicines for migraine headache like almotriptan, eletriptan, frovatriptan, naratriptan, rizatriptan, sumatriptan, zolmitriptan  certain medicines for seizures like carbamazepine and phenytoin  certain medicines for sleep  certain medicines that treat or prevent blood clots like dalteparin, enoxaparin, warfarin  digoxin  fentanyl  lithium  NSAIDS, medicines for pain and inflammation, like ibuprofen or naproxen  other medicines that prolong the QT interval (cause an abnormal heart rhythm) like dofetilide  rasagiline  supplements like St. John's wort, kava kava, valerian  tramadol  tryptophan This list may not describe all possible interactions. Give your health care provider a list of all the medicines, herbs, non-prescription drugs, or dietary supplements you use. Also tell them if you smoke, drink alcohol, or use illegal drugs. Some items may interact with your medicine. What should I watch for while using this medicine? Tell your doctor if your symptoms do not get better or if they  get worse. Visit your doctor or health care professional for regular checks on your progress. Because it may take several weeks to see the full effects of this medicine, it is important to continue your treatment as prescribed by your doctor. Patients and their families should watch out for new or worsening thoughts of suicide or depression. Also watch out for sudden changes in feelings such as feeling anxious, agitated, panicky, irritable, hostile, aggressive, impulsive, severely restless, overly excited and hyperactive, or not being able to sleep. If this happens, especially at the beginning of treatment or after a change in dose, call your health care professional. Bonita QuinYou may get drowsy or dizzy. Do not drive, use machinery, or do anything that needs mental alertness until you know how this medicine affects you. Do not stand or sit  up quickly, especially if you are an older patient. This reduces the risk of dizzy or fainting spells. Alcohol may interfere with the effect of this medicine. Avoid alcoholic drinks. This medicine may cause dry eyes and blurred vision. If you wear contact lenses you may feel some discomfort. Lubricating drops may help. See your eye doctor if the problem does not go away or is severe. Your mouth may get dry. Chewing sugarless gum, sucking hard candy and drinking plenty of water may help. Contact your doctor if the problem does not go away or is severe. What side effects may I notice from receiving this medicine? Side effects that you should report to your doctor or health care professional as soon as possible:  allergic reactions like skin rash, itching or hives, swelling of the face, lips, or tongue  elevated mood, decreased need for sleep, racing thoughts, impulsive behavior  confusion  fast, irregular heartbeat  feeling faint or lightheaded, falls  feeling agitated, angry, or irritable  loss of balance or coordination  painful or prolonged  erections  restlessness, pacing, inability to keep still  suicidal thoughts or other mood changes  tremors  trouble sleeping  seizures  unusual bleeding or bruising Side effects that usually do not require medical attention (report to your doctor or health care professional if they continue or are bothersome):  change in sex drive or performance  change in appetite or weight  constipation  headache  muscle aches or pains  nausea This list may not describe all possible side effects. Call your doctor for medical advice about side effects. You may report side effects to FDA at 1-800-FDA-1088. Where should I keep my medicine? Keep out of the reach of children. Store at room temperature between 15 and 30 degrees C (59 to 86 degrees F). Protect from light. Keep container tightly closed. Throw away any unused medicine after the expiration date. NOTE: This sheet is a summary. It may not cover all possible information. If you have questions about this medicine, talk to your doctor, pharmacist, or health care provider.  2020 Elsevier/Gold Standard (2018-02-08 11:46:46)

## 2018-10-03 NOTE — Progress Notes (Signed)
Patient: Tamara Hoffman Female    DOB: 05/23/1965   53 y.o.   MRN: [id number] Visit Date: 10/03/2018  Today's Provider: Margaretann LovelessJennifer M Ruvi Fullenwider, PA-C   Chief Complaint  Patient presents with  . Vaginal Itching  . Insomnia  . Alcohol Problem   Subjective:     Vaginal Itching The patient's primary symptoms include genital itching and genital lesions. The patient's pertinent negatives include no genital odor, pelvic pain or vaginal discharge. This is a new problem. The current episode started 1 to 4 weeks ago (4 weeks now). The problem occurs every several days. The problem has been unchanged. The patient is experiencing no pain. She is not pregnant. Pertinent negatives include no dysuria, flank pain, frequency or hematuria. There has been no bleeding. She has tried nothing for the symptoms. She is sexually active.   Insomnia: Patient reports that she has trouble sleeping. She reports that she is getting 3-4 hours of sleep.She having trouble staying asleep. Patient has not tried anything OTC to help with her sleep.She reports feeling fatigue. Mother passed away 2 years ago and ever since she has been gaining weight, but has gain more in the past 2 months. She has been using alcohol to help with her sleep.  She also concern about her drinking habit. She reports that now she is drinking every day. She reports that she had seven beers last night. She doesn't know why she has got there and can't figure out why she is drinking a lot.She reports that she is very frustrated.    Depression screen PHQ 2/9 10/03/2018  Decreased Interest 3  Down, Depressed, Hopeless 3  PHQ - 2 Score 6  Altered sleeping 3  Tired, decreased energy 3  Change in appetite 2  Trouble concentrating 3  Moving slowly or fidgety/restless 2  Suicidal thoughts 0  PHQ-9 Score 19  Difficult doing work/chores Somewhat difficult    Allergies  Allergen Reactions  . Phenergan [Promethazine Hcl] Swelling     Current  Outpatient Medications:  .  albuterol (PROVENTIL HFA;VENTOLIN HFA) 108 (90 Base) MCG/ACT inhaler, Inhale 2 puffs into the lungs every 6 (six) hours as needed for wheezing or shortness of breath. (Patient not taking: Reported on 04/18/2018), Disp: 1 Inhaler, Rfl: 2  Review of Systems  Constitutional: Positive for activity change and fatigue.  HENT: Negative.   Respiratory: Negative.   Cardiovascular: Negative.   Gastrointestinal: Negative.   Genitourinary: Negative for dysuria, flank pain, frequency, hematuria, pelvic pain, vaginal bleeding, vaginal discharge and vaginal pain.       Vaginal itching  Psychiatric/Behavioral: Positive for agitation, dysphoric mood and sleep disturbance. Negative for self-injury and suicidal ideas. The patient is nervous/anxious.     Social History   Tobacco Use  . Smoking status: Current Every Day Smoker    Packs/day: 0.50    Types: Cigarettes  . Smokeless tobacco: Never Used  Substance Use Topics  . Alcohol use: Yes    Alcohol/week: 5.0 - 7.0 standard drinks    Types: 5 - 7 Cans of beer per week    Comment: now is almost every day.      Objective:   BP (!) 210/120 (BP Location: Left Arm, Patient Position: Sitting, Cuff Size: Normal)   Pulse 81   Temp 98.8 F (37.1 C) (Oral)   Resp 16   Wt 208 lb 3.2 oz (94.4 kg)   BMI 35.74 kg/m  Vitals:   10/03/18 1607  BP: (!) 210/120  Pulse: 81  Resp: 16  Temp: 98.8 F (37.1 C)  TempSrc: Oral  Weight: 208 lb 3.2 oz (94.4 kg)     Physical Exam Vitals signs reviewed.  Constitutional:      General: She is not in acute distress.    Appearance: Normal appearance. She is well-developed. She is obese. She is not diaphoretic.  HENT:     Head: Normocephalic and atraumatic.  Neck:     Musculoskeletal: Normal range of motion and neck supple.     Thyroid: No thyromegaly.     Vascular: No JVD.     Trachea: No tracheal deviation.  Cardiovascular:     Rate and Rhythm: Normal rate and regular rhythm.      Pulses: Normal pulses.     Heart sounds: Normal heart sounds. No murmur. No friction rub. No gallop.   Pulmonary:     Effort: Pulmonary effort is normal. No respiratory distress.     Breath sounds: Normal breath sounds. No wheezing or rales.  Genitourinary:    Labia:        Right: Lesion present.        Left: Lesion present.      Vagina: Normal.     Comments: Multiple genital wart lesions noted from vaginal opening to rectum Lymphadenopathy:     Cervical: No cervical adenopathy.  Neurological:     Mental Status: She is alert.      No results found for any visits on 10/03/18.     Assessment & Plan    1. Essential hypertension Very elevated today in the office. Will start amlodipine 10mg . Call if worsening.  2. Difficulty sleeping Having worsening symptoms, not sleeping, increased anxiety and depression. Increasing alcohol intake to cope. Multiple home stressors. Referral to psychiatry placed. Trazodone started as below for sleep. Call if worsening. - Ambulatory referral to Psychiatry - traZODone (DESYREL) 50 MG tablet; Take 0.5-1 tablets (25-50 mg total) by mouth at bedtime as needed for sleep.  Dispense: 30 tablet; Refill: 3  3. Depression, major, single episode, moderate ([place]) See above medical treatment plan. - Ambulatory referral to Psychiatry  4. Alcohol abuse Drinking daily, up to 7 beers.  - Ambulatory referral to Psychiatry  5. Genital warts Noted on exam. Most likely source of genital itching. Will treat with podofilox as below.  - podofilox (CONDYLOX) 0.5 % external solution; Apply topically 2 (two) times daily.  Dispense: 3.5 mL; Refill: 0     Mar Daring, PA-C  [facility]

## 2018-10-05 ENCOUNTER — Telehealth: Payer: Self-pay | Admitting: Family Medicine

## 2018-10-05 DIAGNOSIS — I10 Essential (primary) hypertension: Secondary | ICD-10-CM

## 2018-10-05 MED ORDER — LOSARTAN POTASSIUM 50 MG PO TABS: 50.0000 mg | ORAL_TABLET | Freq: Every day | ORAL | 3 refills | Status: DC

## 2018-10-05 NOTE — Telephone Encounter (Signed)
It can take amlodipine 3-4 days to really work, which would be tomorrow or Friday, however, I will also send in Losartan for her to add on to amlodipine.

## 2018-10-05 NOTE — Telephone Encounter (Signed)
Pt is calling back regarding her bp reading.  It's still high and asking what Tawanna Sat thinking about her reading being the lowest of 185/118.  Please advise.  Thanks, [employer]

## 2018-10-05 NOTE — Telephone Encounter (Signed)
Patient was advised.  

## 2018-10-05 NOTE — Telephone Encounter (Signed)
Please advise 

## 2018-10-06 ENCOUNTER — Telehealth: Payer: Self-pay

## 2018-10-06 NOTE — Telephone Encounter (Signed)
Patient advised to go to ER for further evaluation. Patient agreed.

## 2018-10-06 NOTE — Telephone Encounter (Signed)
Patient called with complaints of blurred vision, dizziness, eye redness, and nausea. Her blood pressure is 175/108. She has started the new medication, but states she feels spacey. She also took the medication on an empty stomach, but just ate about 30 minutes ago. Please advise.

## 2018-10-06 NOTE — Telephone Encounter (Signed)
With those symptoms she may need to go to ER. Sometimes it takes an IV medication to bring BP down.

## 2018-10-07 ENCOUNTER — Telehealth: Payer: Self-pay

## 2018-10-07 DIAGNOSIS — I10 Essential (primary) hypertension: Secondary | ICD-10-CM

## 2018-10-07 MED ORDER — AMLODIPINE BESYLATE 10 MG PO TABS: 10.0000 mg | ORAL_TABLET | Freq: Every day | ORAL | 3 refills | Status: DC

## 2018-10-07 NOTE — Telephone Encounter (Signed)
[facility] in [place] faxed a form regarding the prescription that was e-prescribed for Amlodipine Comments: Picture of rx did not come thru-please resend.  Rx sent to [facility]

## 2018-10-08 ENCOUNTER — Emergency Department: Payer: BLUE CROSS/BLUE SHIELD

## 2018-10-08 ENCOUNTER — Emergency Department
Admission: EM | Admit: 2018-10-08 | Discharge: 2018-10-08 | Disposition: A | Discharge status: Home / Self Care | Payer: BLUE CROSS/BLUE SHIELD | Attending: Emergency Medicine | Admitting: Emergency Medicine

## 2018-10-08 ENCOUNTER — Other Ambulatory Visit: Payer: Self-pay

## 2018-10-08 DIAGNOSIS — F17210 Nicotine dependence, cigarettes, uncomplicated: Secondary | ICD-10-CM | POA: Insufficient documentation

## 2018-10-08 DIAGNOSIS — R0789 Other chest pain: Secondary | ICD-10-CM | POA: Diagnosis not present

## 2018-10-08 DIAGNOSIS — I10 Essential (primary) hypertension: Secondary | ICD-10-CM | POA: Diagnosis not present

## 2018-10-08 DIAGNOSIS — Z79899 Other long term (current) drug therapy: Secondary | ICD-10-CM | POA: Diagnosis not present

## 2018-10-08 LAB — TROPONIN I (HIGH SENSITIVITY)
Troponin I (High Sensitivity): 5 ng/L (ref <18)
Troponin I (High Sensitivity): 4 ng/L (ref <18)

## 2018-10-08 LAB — BASIC METABOLIC PANEL
Anion gap: 11 (ref 5–15)
BUN: 18 mg/dL (ref 6–20)
CO2: 22 mmol/L (ref 22–32)
Calcium: 8.7 mg/dL — ABNORMAL LOW (ref 8.9–10.3)
Chloride: 105 mmol/L (ref 98–111)
Creatinine, Ser: 0.6 mg/dL (ref 0.44–1.00)
GFR calc Af Amer: >60 mL/min (ref >60)
GFR calc non Af Amer: >60 mL/min (ref >60)
Glucose, Bld: 101 mg/dL — ABNORMAL HIGH (ref 70–99)
Potassium: 4.6 mmol/L (ref 3.5–5.1)
Sodium: 138 mmol/L (ref 135–145)

## 2018-10-08 LAB — CBC
HCT: 48.1 % — ABNORMAL HIGH (ref 36.0–46.0)
Hemoglobin: 15.7 g/dL — ABNORMAL HIGH (ref 12.0–15.0)
MCH: 31.1 pg (ref 26.0–34.0)
MCHC: 32.6 g/dL (ref 30.0–36.0)
MCV: 95.2 fL (ref 80.0–100.0)
Platelets: 265 10*3/uL (ref 150–400)
RBC: 5.05 MIL/uL (ref 3.87–5.11)
RDW: 13.6 % (ref 11.5–15.5)
WBC: 6.8 10*3/uL (ref 4.0–10.5)
nRBC: 0 % (ref 0.0–0.2)

## 2018-10-08 MED ORDER — SODIUM CHLORIDE 0.9% FLUSH: 3.0000 mL | Freq: Once | INTRAVENOUS | Status: DC

## 2018-10-08 NOTE — ED Notes (Signed)
Pt returned from xray via stretcher.

## 2018-10-08 NOTE — ED Notes (Signed)
EDP at bedside  

## 2018-10-08 NOTE — Discharge Instructions (Addendum)
Continue taking your blood pressure medications as prescribed.  Call your doctor early this week to check in and see if any changes are needed.  Return to the ER immediately for new or worsening chest discomfort, weakness, shortness of breath, blood pressure over 200 on the top number or 120 on the bottom number, or any other new or worsening symptoms that concern you.

## 2018-10-08 NOTE — ED Triage Notes (Signed)
Pt presents c/o HTN. Reports seen PCP on Monday and started on new medications for HTN. Reports remains HTN. Reports intermittent chest pain. Reports chest tightness currently. Reports medication maker her feel "weird".

## 2018-10-08 NOTE — ED Notes (Signed)
Dr. Siadecki at bedside 

## 2018-10-08 NOTE — ED Notes (Addendum)
Pt walked out without waiting for discharge papers- this RN found her waiting outside and was able to go over discharge and give pt the paperwork- unable to obtain vital signs or get a signature for discharge

## 2018-10-08 NOTE — ED Provider Notes (Signed)
[facility] Emergency Department Provider Note ____________________________________________   First MD Initiated Contact with Patient 10/08/18 [id number]     (approximate)  I have reviewed the triage vital signs and the nursing notes.   HISTORY  Chief Complaint Hypertension and Chest Pain    HPI Tamara Hoffman is a 53 y.o. female with PMH as noted below who presents with elevated blood pressure, measured to as high as the 180s over 110s at home, associated with some chest discomfort today which she describes as a tightness.  The patient states that she went to her PMD 5 days ago and was diagnosed with elevated blood pressure which was over 200 systolic at that time.  She was started on amlodipine, and subsequently 2 days later was given losartan as well.  The patient states that she feels "weird" with these medicines and that the blood pressure is still up.  Past Medical History:  Diagnosis Date   GERD (gastroesophageal reflux disease)    Hypertension     Patient Active Problem List   Diagnosis Date Noted   GI bleed 02/17/2017    Past Surgical History:  Procedure Laterality Date   LEEP      Prior to Admission medications   Medication Sig Start Date End Date Taking? Authorizing Provider  albuterol (PROVENTIL HFA;VENTOLIN HFA) 108 (90 Base) MCG/ACT inhaler Inhale 2 puffs into the lungs every 6 (six) hours as needed for wheezing or shortness of breath. 12/07/17  Yes Osvaldo AngstPollak, Adriana M, PA-C  amLODipine (NORVASC) 10 MG tablet Take 1 tablet (10 mg total) by mouth daily. 10/07/18  Yes Margaretann LovelessBurnette, Jennifer M, PA-C  losartan (COZAAR) 50 MG tablet Take 1 tablet (50 mg total) by mouth daily. 10/05/18  Yes Margaretann LovelessBurnette, Jennifer M, PA-C  podofilox (CONDYLOX) 0.5 % external solution Apply topically 2 (two) times daily. 10/03/18  Yes Margaretann LovelessBurnette, Jennifer M, PA-C  traZODone (DESYREL) 50 MG tablet Take 0.5-1 tablets (25-50 mg total) by mouth at bedtime as needed for sleep. 10/03/18   Yes Margaretann LovelessBurnette, Jennifer M, PA-C    Allergies Phenergan [promethazine hcl]  Family History  Problem Relation Age of Onset   Diabetes Mother    Hypertension Father    Heart disease Father    Diabetes Sister    Cancer Maternal Grandmother     Social History Social History   Tobacco Use   Smoking status: Current Every Day Smoker    Packs/day: 0.50    Types: Cigarettes   Smokeless tobacco: Never Used  Substance Use Topics   Alcohol use: Yes    Alcohol/week: 5.0 - 7.0 standard drinks    Types: 5 - 7 Cans of beer per week    Comment: now is almost every day.   Drug use: No    Review of Systems  Constitutional: No fever. Eyes: No redness. ENT: No sore throat. Cardiovascular: Positive for chest discomfort. Respiratory: Denies shortness of breath. Gastrointestinal: No vomiting or diarrhea.  Genitourinary: Negative for dysuria.  Musculoskeletal: Negative for back pain. Skin: Negative for rash. Neurological: Negative for headache.   ____________________________________________   PHYSICAL EXAM:  VITAL SIGNS: ED Triage Vitals  Enc Vitals Group     BP 10/08/18 0830 (!) 159/85     Pulse Rate 10/08/18 0830 85     Resp 10/08/18 0830 14     Temp 10/08/18 0830 98.4 F (36.9 C)     Temp Source 10/08/18 0830 Oral     SpO2 10/08/18 0830 99 %  Weight 10/08/18 0831 200 lb (90.7 kg)     Height --      Head Circumference --      Peak Flow --      Pain Score 10/08/18 0831 0     Pain Loc --      Pain Edu? --      Excl. in [place]? --     Constitutional: Alert and oriented. Well appearing and in no acute distress. Eyes: Conjunctivae are normal.  Head: Atraumatic. Nose: No congestion/rhinnorhea. Mouth/Throat: Mucous membranes are moist.   Neck: Normal range of motion.  Cardiovascular: Normal rate, regular rhythm. Good peripheral circulation. Respiratory: Normal respiratory effort.  No retractions. Gastrointestinal: No distention.  Musculoskeletal: Extremities warm  and well perfused.  Neurologic:  Normal speech and language. No gross focal neurologic deficits are appreciated.  Skin:  Skin is warm and dry. No rash noted. Psychiatric: Mood and affect are normal. Speech and behavior are normal.  ____________________________________________   LABS (all labs ordered are listed, but only abnormal results are displayed)  Labs Reviewed  BASIC METABOLIC PANEL - Abnormal; Notable for the following components:      Result Value   Glucose, Bld 101 (*)    Calcium 8.7 (*)    All other components within normal limits  CBC - Abnormal; Notable for the following components:   Hemoglobin 15.7 (*)    HCT 48.1 (*)    All other components within normal limits  POC URINE PREG, ED  TROPONIN I (HIGH SENSITIVITY)  TROPONIN I (HIGH SENSITIVITY)   ____________________________________________  EKG  ED ECG REPORT I, Arta Silence, the attending physician, personally viewed and interpreted this ECG.  Date: 10/08/2018 EKG Time: 0826 Rate: 79 Rhythm: normal sinus rhythm QRS Axis: normal Intervals: normal ST/T Wave abnormalities: normal Narrative Interpretation: no evidence of acute ischemia  ____________________________________________  RADIOLOGY  CXR: No focal infiltrate or other acute abnormality  ____________________________________________   PROCEDURES  Procedure(s) performed: No  Procedures  Critical Care performed: No ____________________________________________   INITIAL IMPRESSION / ASSESSMENT AND PLAN / ED COURSE  Pertinent labs & imaging results that were available during my care of the patient were reviewed by me and considered in my medical decision making (see chart for details).  53 year old female presents with elevated blood pressure.  She was diagnosed with hypertension earlier this week and started on amlodipine, then losartan a few days later.  She reports continued elevated readings and states that she feels weird on the  medications.  She also reports some chest discomfort today which is nonexertional.  I reviewed the past records in Epic and confirmed the above history and medications.  On exam, the patient is well-appearing.  Her vital signs are normal except for elevated blood pressure although it is significantly improved here from the number she was reporting at home.  The remainder of the exam is unremarkable.  EKG is nonischemic.  Overall I suspect essential hypertension.  The patient likely has not equilibrated after starting the 2 new medications this week.  I have a low suspicion for ACS.  We will obtain basic labs to evaluate for endorgan dysfunction, troponins x2, and reassess.  At this time there is no indication for IV antihypertensive although we will repeat the blood pressure.  ----------------------------------------- 12:07 PM on 10/08/2018 -----------------------------------------  Blood pressure has remained in the range of 150s to 160s over 80s.  The patient has no active chest pain.  Lab work-up including initial and repeat troponin were all negative.  At this time, the patient is stable for discharge home.  I counseled her on the results of the work-up.  I advised her to continue both the amlodipine and losartan, keep a log of her blood pressures, and to call her PMD on Monday to check and and decide if any changes are needed.  Return precautions given, and she expresses understanding. ____________________________________________   FINAL CLINICAL IMPRESSION(S) / ED DIAGNOSES  Final diagnoses:  Hypertension, unspecified type      NEW MEDICATIONS STARTED DURING THIS VISIT:  New Prescriptions   No medications on file     Note:  This document was prepared using Dragon voice recognition software and may include unintentional dictation errors.    Dionne BucySiadecki, Pravin Perezperez, MD 10/08/18 1210

## 2018-10-18 ENCOUNTER — Encounter: Payer: Self-pay | Admitting: Physician Assistant

## 2018-10-18 NOTE — Progress Notes (Deleted)
Patient: Tamara Hoffman, Female    DOB: 16-Dec-1965, 53 y.o.   MRN: [id number] Visit Date: 10/18/2018  Today's Provider: Mar Daring, PA-C   No chief complaint on file.  Subjective:     Annual physical exam Tamara Hoffman is a 53 y.o. female who presents today for health maintenance and complete physical. She feels {DESC; WELL/FAIRLY WELL/POORLY:18703}. She reports exercising ***. She reports she is sleeping {DESC; WELL/FAIRLY WELL/POORLY:18703}.  -----------------------------------------------------------------   Review of Systems  Social History      She  reports that she has been smoking cigarettes. She has been smoking about 0.50 packs per day. She has never used smokeless tobacco. She reports current alcohol use of about 5.0 - 7.0 standard drinks of alcohol per week. She reports that she does not use drugs.       Social History   Socioeconomic History  . Marital status: Married    Spouse name: Not on file  . Number of children: Not on file  . Years of education: Not on file  . Highest education level: Not on file  Occupational History  . Not on file  Social Needs  . Financial resource strain: Not on file  . Food insecurity    Worry: Not on file    Inability: Not on file  . Transportation needs    Medical: Not on file    Non-medical: Not on file  Tobacco Use  . Smoking status: Current Every Day Smoker    Packs/day: 0.50    Types: Cigarettes  . Smokeless tobacco: Never Used  Substance and Sexual Activity  . Alcohol use: Yes    Alcohol/week: 5.0 - 7.0 standard drinks    Types: 5 - 7 Cans of beer per week    Comment: now is almost every day.  . Drug use: No  . Sexual activity: Not on file  Lifestyle  . Physical activity    Days per week: Not on file    Minutes per session: Not on file  . Stress: Not on file  Relationships  . Social Herbalist on phone: Not on file    Gets together: Not on file    Attends religious service: Not on  file    Active member of club or organization: Not on file    Attends meetings of clubs or organizations: Not on file    Relationship status: Not on file  Other Topics Concern  . Not on file  Social History Narrative   Independent at baseline. Son lives with her.    Past Medical History:  Diagnosis Date  . GERD (gastroesophageal reflux disease)   . Hypertension      Patient Active Problem List   Diagnosis Date Noted  . GI bleed 02/17/2017    Past Surgical History:  Procedure Laterality Date  . LEEP      Family History        Family Status  Relation Name Status  . Mother  Deceased  . Father  Alive  . Sister  Alive  . MGM  (Not Specified)        Her family history includes Cancer in her maternal grandmother; Diabetes in her mother and sister; Heart disease in her father; Hypertension in her father.      Allergies  Allergen Reactions  . Phenergan [Promethazine Hcl] Swelling     Current Outpatient Medications:  .  albuterol (PROVENTIL HFA;VENTOLIN HFA) 108 (90 Base) MCG/ACT  inhaler, Inhale 2 puffs into the lungs every 6 (six) hours as needed for wheezing or shortness of breath., Disp: 1 Inhaler, Rfl: 2 .  amLODipine (NORVASC) 10 MG tablet, Take 1 tablet (10 mg total) by mouth daily., Disp: 90 tablet, Rfl: 3 .  losartan (COZAAR) 50 MG tablet, Take 1 tablet (50 mg total) by mouth daily., Disp: 90 tablet, Rfl: 3 .  podofilox (CONDYLOX) 0.5 % external solution, Apply topically 2 (two) times daily., Disp: 3.5 mL, Rfl: 0 .  traZODone (DESYREL) 50 MG tablet, Take 0.5-1 tablets (25-50 mg total) by mouth at bedtime as needed for sleep., Disp: 30 tablet, Rfl: 3   Patient Care Team: Chrismon, Jodell Ciproennis E, PA as PCP - General (Family Medicine)    Objective:    Vitals: There were no vitals taken for this visit.  There were no vitals filed for this visit.   Physical Exam   Depression Screen PHQ 2/9 Scores 10/03/2018  PHQ - 2 Score 6  PHQ- 9 Score 19       Assessment &  Plan:     Routine Health Maintenance and Physical Exam  Exercise Activities and Dietary recommendations Goals   None     There is no immunization history for the selected administration types on file for this patient.  Health Maintenance  Topic Date Due  . TETANUS/TDAP  09/02/1985  . PAP SMEAR-Modifier  09/03/1987  . MAMMOGRAM  09/02/2016  . COLONOSCOPY  09/02/2016  . INFLUENZA VACCINE  10/01/2018  . HIV Screening  Completed     Discussed health benefits of physical activity, and encouraged her to engage in regular exercise appropriate for her age and condition.    --------------------------------------------------------------------    Margaretann LovelessJennifer M Burnette, PA-C  [facility]

## 2018-10-25 ENCOUNTER — Other Ambulatory Visit: Payer: Self-pay

## 2018-10-25 ENCOUNTER — Ambulatory Visit: Payer: BLUE CROSS/BLUE SHIELD | Admitting: Family Medicine

## 2018-10-25 ENCOUNTER — Encounter: Payer: Self-pay | Admitting: Family Medicine

## 2018-10-25 ENCOUNTER — Telehealth: Payer: Self-pay | Admitting: Internal Medicine

## 2018-10-25 VITALS — BP 152/102 | HR 83 | Temp 97.5°F | Wt 207.0 lb

## 2018-10-25 DIAGNOSIS — I10 Essential (primary) hypertension: Secondary | ICD-10-CM

## 2018-10-25 DIAGNOSIS — F418 Other specified anxiety disorders: Secondary | ICD-10-CM

## 2018-10-25 DIAGNOSIS — R0789 Other chest pain: Secondary | ICD-10-CM

## 2018-10-25 MED ORDER — CHLORTHALIDONE 25 MG PO TABS: 25.0000 mg | ORAL_TABLET | Freq: Every day | ORAL | 3 refills | Status: DC

## 2018-10-25 MED ORDER — PANTOPRAZOLE SODIUM 40 MG PO TBEC: 40.0000 mg | DELAYED_RELEASE_TABLET | Freq: Every day | ORAL | 3 refills | Status: DC

## 2018-10-25 NOTE — Telephone Encounter (Signed)
° ° °  COVID-19 Pre-Screening Questions:   In the past 7 to 10 days have you had a cough,  shortness of breath, headache, congestion, fever (100 or greater) body aches, chills, sore throat, or sudden loss of taste or sense of smell?  Have you been around anyone with known Covid 19.  Have you been around anyone who is awaiting Covid 19 test results in the past 7 to 10 days?  Have you been around anyone who has been exposed to Covid 19, or has mentioned symptoms of Covid 19 within the past 7 to 10 days?  If you have any concerns/questions about symptoms patients report during screening (either on the phone or at threshold). Contact the provider seeing the patient or DOD for further guidance.  If neither are available contact a member of the leadership team.   NO TO ALL ABOVE PER PATIENT

## 2018-10-25 NOTE — Progress Notes (Signed)
Tamara Hoffman  MRN: [id number] DOB: [birth year]  Subjective:  HPI   The patient is a 53 year old female who presents for follow up of her hypertension.  She states she has been on medicine for about 1 month and does not see any changes in the readings.   She states she has been compliant with the medications but states she has no energy on the medicine. She states she has been sad and not wanting to do anything since her mother's death about 1 year ago.  She is also having to deal with her grown son and that is causing her great anxiety.  Past Surgical History:  Procedure Laterality Date  . LEEP     Patient Active Problem List   Diagnosis Date Noted  . GI bleed 02/17/2017   Past Medical History:  Diagnosis Date  . GERD (gastroesophageal reflux disease)   . Hypertension    Social History   Socioeconomic History  . Marital status: Married    Spouse name: Not on file  . Number of children: Not on file  . Years of education: Not on file  . Highest education level: Not on file  Occupational History  . Not on file  Social Needs  . Financial resource strain: Not on file  . Food insecurity    Worry: Not on file    Inability: Not on file  . Transportation needs    Medical: Not on file    Non-medical: Not on file  Tobacco Use  . Smoking status: Current Every Day Smoker    Packs/day: 0.50    Types: Cigarettes  . Smokeless tobacco: Never Used  Substance and Sexual Activity  . Alcohol use: Yes    Alcohol/week: 5.0 - 7.0 standard drinks    Types: 5 - 7 Cans of beer per week    Comment: now is almost every day.  . Drug use: No  . Sexual activity: Not on file  Lifestyle  . Physical activity    Days per week: Not on file    Minutes per session: Not on file  . Stress: Not on file  Relationships  . Social Herbalist on phone: Not on file    Gets together: Not on file    Attends religious service: Not on file    Active member of club or organization: Not on file     Attends meetings of clubs or organizations: Not on file    Relationship status: Not on file  . Intimate partner violence    Fear of current or ex partner: Not on file    Emotionally abused: Not on file    Physically abused: Not on file    Forced sexual activity: Not on file  Other Topics Concern  . Not on file  Social History Narrative   Independent at baseline. Son lives with her.   Outpatient Encounter Medications as of 10/25/2018  Medication Sig  . albuterol (PROVENTIL HFA;VENTOLIN HFA) 108 (90 Base) MCG/ACT inhaler Inhale 2 puffs into the lungs every 6 (six) hours as needed for wheezing or shortness of breath.  Marland Kitchen amLODipine (NORVASC) 10 MG tablet Take 1 tablet (10 mg total) by mouth daily.  Marland Kitchen losartan (COZAAR) 50 MG tablet Take 1 tablet (50 mg total) by mouth daily.  . podofilox (CONDYLOX) 0.5 % external solution Apply topically 2 (two) times daily.  . traZODone (DESYREL) 50 MG tablet Take 0.5-1 tablets (25-50 mg total) by mouth at bedtime as  needed for sleep.   No facility-administered encounter medications on file as of 10/25/2018.    Allergies  Allergen Reactions  . Phenergan [Promethazine Hcl] Swelling   Review of Systems  Constitutional: Positive for malaise/fatigue. Negative for diaphoresis and fever.  HENT: Negative for congestion, ear pain, sinus pain and sore throat.   Respiratory: Positive for cough (smoker) and shortness of breath. Negative for wheezing.   Cardiovascular: Positive for palpitations and leg swelling. Negative for chest pain, orthopnea and claudication.    Objective:  BP (!) 152/102 (BP Location: Right Arm, Patient Position: Sitting, Cuff Size: Normal)   Pulse 83   Temp (!) 97.5 F (36.4 C) (Oral)   Wt 207 lb (93.9 kg)   SpO2 99%   BMI 35.53 kg/m   Physical Exam  Constitutional: She is oriented to person, place, and time and well-developed, well-nourished, and in no distress.  HENT:  Head: Normocephalic.  Eyes: Conjunctivae are normal.   Neck: Neck supple.  Cardiovascular: Normal rate and regular rhythm.  Pulmonary/Chest: Effort normal. No respiratory distress.  Abdominal: Soft. Bowel sounds are normal.  Musculoskeletal: Normal range of motion.  Neurological: She is alert and oriented to person, place, and time.  Skin: No rash noted.  Psychiatric: Affect and judgment normal. Her mood appears anxious. She exhibits a depressed mood. She expresses no suicidal plans and no homicidal plans.    Assessment and Plan :   1. Uncontrolled hypertension Despite being on Amlodipine 10 mg qd and Losartan 50 mg qd the past 3 weeks, still has BP readings in the 150/100 to 202/118 at home. Feels she has some swelling but no visible today. Has stopped ETOH but still smoking 1 ppd. Denies use of excessive salt or caffeine. Went to the ER on 10-08-18 due to chest discomfort and very high BP. Labs, EKG and CXR were no diagnostic. Will add Chlothalidone 25 mg qd and continue Amlodipine with Losartan. Check CBC, CMP and thyroid function. Will proceed with cardiology referral and follow up pending lab reports. - CBC with Differential/Platelet - Comprehensive metabolic panel - TSH - T4 - chlorthalidone (HYGROTON) 25 MG tablet; Take 1 tablet (25 mg total) by mouth daily.  Dispense: 30 tablet; Refill: 3 - Ambulatory referral to Cardiology  2. Chest discomfort Had some chest discomfort intermittently and thinks it is "heartburn". Is still a 1 ppd smoker and has a cough occasionally. No fever, wheezing or sore throat. Feels her sense of taste is better when she take the Trazodone and not a new finding. No exposure to anyone positive for COVID-19. No hematemesis, vomiting or hematochezia. Will give Protonix, check CBC and schedule cardiology referral. EKG's in the ER on 10-08-18 did not show acute changes and Troponin levels were normal. - CBC with Differential/Platelet - pantoprazole (PROTONIX) 40 MG tablet; Take 1 tablet (40 mg total) by mouth daily.   Dispense: 30 tablet; Refill: 3 - Ambulatory referral to Cardiology  3. Depression with anxiety Feeling sad and anxious with difficulty getting restful sleep. Feels mood is very down but was getting good relief from the Trazodone when she uses it. Recommend she use it EVERY night and not just as a sleeping pill. Recheck labs and follow up pending reports. - CBC with Differential/Platelet - Comprehensive metabolic panel - TSH - T4

## 2018-10-26 ENCOUNTER — Ambulatory Visit (INDEPENDENT_AMBULATORY_CARE_PROVIDER_SITE_OTHER): Payer: BLUE CROSS/BLUE SHIELD | Admitting: Internal Medicine

## 2018-10-26 ENCOUNTER — Inpatient Hospital Stay
Admission: AD | Admit: 2018-10-26 | Discharge: 2018-10-27 | DRG: 313 | Disposition: A | Discharge status: Home / Self Care | Payer: BLUE CROSS/BLUE SHIELD | Source: Ambulatory Visit | Attending: Internal Medicine | Admitting: Internal Medicine

## 2018-10-26 ENCOUNTER — Encounter: Payer: Self-pay | Admitting: Internal Medicine

## 2018-10-26 ENCOUNTER — Inpatient Hospital Stay: Payer: BLUE CROSS/BLUE SHIELD

## 2018-10-26 VITALS — BP 128/82 | HR 91 | Ht 64.0 in | Wt 207.5 lb

## 2018-10-26 DIAGNOSIS — Z888 Allergy status to other drugs, medicaments and biological substances status: Secondary | ICD-10-CM | POA: Diagnosis not present

## 2018-10-26 DIAGNOSIS — Z720 Tobacco use: Secondary | ICD-10-CM | POA: Diagnosis not present

## 2018-10-26 DIAGNOSIS — R0789 Other chest pain: Principal | ICD-10-CM | POA: Diagnosis present

## 2018-10-26 DIAGNOSIS — Z79899 Other long term (current) drug therapy: Secondary | ICD-10-CM | POA: Diagnosis not present

## 2018-10-26 DIAGNOSIS — I200 Unstable angina: Secondary | ICD-10-CM | POA: Insufficient documentation

## 2018-10-26 DIAGNOSIS — F1020 Alcohol dependence, uncomplicated: Secondary | ICD-10-CM | POA: Diagnosis present

## 2018-10-26 DIAGNOSIS — F17210 Nicotine dependence, cigarettes, uncomplicated: Secondary | ICD-10-CM | POA: Diagnosis present

## 2018-10-26 DIAGNOSIS — Z8249 Family history of ischemic heart disease and other diseases of the circulatory system: Secondary | ICD-10-CM | POA: Diagnosis not present

## 2018-10-26 DIAGNOSIS — F1290 Cannabis use, unspecified, uncomplicated: Secondary | ICD-10-CM | POA: Diagnosis present

## 2018-10-26 DIAGNOSIS — F419 Anxiety disorder, unspecified: Secondary | ICD-10-CM | POA: Diagnosis present

## 2018-10-26 DIAGNOSIS — K219 Gastro-esophageal reflux disease without esophagitis: Secondary | ICD-10-CM | POA: Diagnosis present

## 2018-10-26 DIAGNOSIS — F1910 Other psychoactive substance abuse, uncomplicated: Secondary | ICD-10-CM | POA: Diagnosis not present

## 2018-10-26 DIAGNOSIS — I7789 Other specified disorders of arteries and arterioles: Secondary | ICD-10-CM | POA: Insufficient documentation

## 2018-10-26 DIAGNOSIS — F17200 Nicotine dependence, unspecified, uncomplicated: Secondary | ICD-10-CM | POA: Diagnosis not present

## 2018-10-26 DIAGNOSIS — I259 Chronic ischemic heart disease, unspecified: Secondary | ICD-10-CM | POA: Diagnosis not present

## 2018-10-26 DIAGNOSIS — Z20828 Contact with and (suspected) exposure to other viral communicable diseases: Secondary | ICD-10-CM | POA: Diagnosis present

## 2018-10-26 DIAGNOSIS — F1010 Alcohol abuse, uncomplicated: Secondary | ICD-10-CM | POA: Diagnosis not present

## 2018-10-26 DIAGNOSIS — R079 Chest pain, unspecified: Secondary | ICD-10-CM | POA: Diagnosis present

## 2018-10-26 DIAGNOSIS — I10 Essential (primary) hypertension: Secondary | ICD-10-CM | POA: Diagnosis not present

## 2018-10-26 LAB — COMPREHENSIVE METABOLIC PANEL
ALT: 18 IU/L (ref 0–32)
ALT: 21 U/L (ref 0–44)
AST: 18 IU/L (ref 0–40)
AST: 21 U/L (ref 15–41)
Albumin/Globulin Ratio: 1.9 (ref 1.2–2.2)
Albumin: 4.6 g/dL (ref 3.8–4.9)
Albumin: 4 g/dL (ref 3.5–5.0)
Alkaline Phosphatase: 100 IU/L (ref 39–117)
Alkaline Phosphatase: 84 U/L (ref 38–126)
Anion gap: 10 (ref 5–15)
BUN/Creatinine Ratio: 21 (ref 9–23)
BUN: 19 mg/dL (ref 6–24)
BUN: 19 mg/dL (ref 6–20)
Bilirubin Total: 0.2 mg/dL (ref 0.0–1.2)
CO2: 21 mmol/L (ref 20–29)
CO2: 25 mmol/L (ref 22–32)
Calcium: 9.8 mg/dL (ref 8.7–10.2)
Calcium: 9.5 mg/dL (ref 8.9–10.3)
Chloride: 101 mmol/L (ref 96–106)
Chloride: 104 mmol/L (ref 98–111)
Creatinine, Ser: 0.92 mg/dL (ref 0.57–1.00)
Creatinine, Ser: 0.83 mg/dL (ref 0.44–1.00)
GFR calc Af Amer: 83 mL/min/{1.73_m2} (ref >59)
GFR calc Af Amer: >60 mL/min (ref >60)
GFR calc non Af Amer: 72 mL/min/{1.73_m2} (ref >59)
GFR calc non Af Amer: >60 mL/min (ref >60)
Globulin, Total: 2.4 g/dL (ref 1.5–4.5)
Glucose, Bld: 97 mg/dL (ref 70–99)
Glucose: 107 mg/dL — ABNORMAL HIGH (ref 65–99)
Potassium: 4.7 mmol/L (ref 3.5–5.2)
Potassium: 4.5 mmol/L (ref 3.5–5.1)
Sodium: 139 mmol/L (ref 134–144)
Sodium: 139 mmol/L (ref 135–145)
Total Bilirubin: 0.5 mg/dL (ref 0.3–1.2)
Total Protein: 7 g/dL (ref 6.0–8.5)
Total Protein: 7.2 g/dL (ref 6.5–8.1)

## 2018-10-26 LAB — TROPONIN I (HIGH SENSITIVITY)
Troponin I (High Sensitivity): 7 ng/L (ref <18)
Troponin I (High Sensitivity): 7 ng/L (ref <18)

## 2018-10-26 LAB — CBC WITH DIFFERENTIAL/PLATELET
Basophils Absolute: 0.1 10*3/uL (ref 0.0–0.2)
Basos: 1 %
EOS (ABSOLUTE): 0.5 10*3/uL — ABNORMAL HIGH (ref 0.0–0.4)
Eos: 6 %
Hematocrit: 46.9 % — ABNORMAL HIGH (ref 34.0–46.6)
Hemoglobin: 15.8 g/dL (ref 11.1–15.9)
Immature Grans (Abs): 0 10*3/uL (ref 0.0–0.1)
Immature Granulocytes: 0 %
Lymphocytes Absolute: 1.7 10*3/uL (ref 0.7–3.1)
Lymphs: 22 %
MCH: 30.4 pg (ref 26.6–33.0)
MCHC: 33.7 g/dL (ref 31.5–35.7)
MCV: 90 fL (ref 79–97)
Monocytes Absolute: 0.8 10*3/uL (ref 0.1–0.9)
Monocytes: 10 %
Neutrophils Absolute: 4.6 10*3/uL (ref 1.4–7.0)
Neutrophils: 61 %
Platelets: 287 10*3/uL (ref 150–450)
RBC: 5.2 x10E6/uL (ref 3.77–5.28)
RDW: 12 % (ref 11.7–15.4)
WBC: 7.7 10*3/uL (ref 3.4–10.8)

## 2018-10-26 LAB — T4: T4, Total: 5.7 ug/dL (ref 4.5–12.0)

## 2018-10-26 LAB — TSH: TSH: 2.02 u[IU]/mL (ref 0.450–4.500)

## 2018-10-26 MED ORDER — AMLODIPINE BESYLATE 10 MG PO TABS: 10.0000 mg | ORAL_TABLET | Freq: Every day | ORAL | Status: DC

## 2018-10-26 MED ORDER — ENOXAPARIN SODIUM 40 MG/0.4ML ~~LOC~~ SOLN: 40.0000 mg | SUBCUTANEOUS | Status: DC

## 2018-10-26 MED ORDER — ASPIRIN 81 MG PO CHEW
324.0000 mg | CHEWABLE_TABLET | Freq: Once | ORAL | Status: AC
  Administered 2018-10-26: 324 mg via ORAL

## 2018-10-26 MED ORDER — AMLODIPINE BESYLATE 5 MG PO TABS
5.0000 mg | ORAL_TABLET | Freq: Every day | ORAL | Status: DC
  Administered 2018-10-27: 5 mg via ORAL
  Filled 2018-10-26 (×2): qty 1

## 2018-10-26 MED ORDER — PANTOPRAZOLE SODIUM 40 MG PO TBEC
40.0000 mg | DELAYED_RELEASE_TABLET | Freq: Every day | ORAL | Status: DC
  Administered 2018-10-26 – 2018-10-27 (×2): 40 mg via ORAL
  Filled 2018-10-26 (×3): qty 1

## 2018-10-26 MED ORDER — ASPIRIN EC 81 MG PO TBEC
81.0000 mg | DELAYED_RELEASE_TABLET | Freq: Every day | ORAL | Status: DC
  Administered 2018-10-27: 81 mg via ORAL
  Filled 2018-10-26 (×3): qty 1

## 2018-10-26 MED ORDER — ACETAMINOPHEN 325 MG PO TABS
650.0000 mg | ORAL_TABLET | ORAL | Status: DC | PRN
  Administered 2018-10-26: 650 mg via ORAL
  Filled 2018-10-26: qty 2

## 2018-10-26 MED ORDER — NITROGLYCERIN 0.4 MG SL SUBL
SUBLINGUAL_TABLET | SUBLINGUAL | Status: AC
  Administered 2018-10-26: 0.4 mg
  Filled 2018-10-26: qty 1

## 2018-10-26 MED ORDER — LOSARTAN POTASSIUM 50 MG PO TABS
50.0000 mg | ORAL_TABLET | Freq: Every day | ORAL | Status: DC
  Administered 2018-10-26 – 2018-10-27 (×2): 50 mg via ORAL
  Filled 2018-10-26 (×3): qty 1

## 2018-10-26 MED ORDER — METOPROLOL TARTRATE 25 MG PO TABS: 25.0000 mg | ORAL_TABLET | Freq: Two times a day (BID) | ORAL | Status: DC

## 2018-10-26 MED ORDER — TRAZODONE HCL 50 MG PO TABS
25.0000 mg | ORAL_TABLET | Freq: Every evening | ORAL | Status: DC | PRN
  Administered 2018-10-26: 50 mg via ORAL
  Filled 2018-10-26: qty 1

## 2018-10-26 MED ORDER — ALBUTEROL SULFATE HFA 108 (90 BASE) MCG/ACT IN AERS: 2.0000 | INHALATION_SPRAY | Freq: Four times a day (QID) | RESPIRATORY_TRACT | Status: DC | PRN

## 2018-10-26 MED ORDER — CARVEDILOL 6.25 MG PO TABS
6.2500 mg | ORAL_TABLET | Freq: Two times a day (BID) | ORAL | Status: DC
  Filled 2018-10-26: qty 1

## 2018-10-26 MED ORDER — ATORVASTATIN CALCIUM 20 MG PO TABS: 40.0000 mg | ORAL_TABLET | Freq: Every day | ORAL | Status: DC

## 2018-10-26 MED ORDER — NITROGLYCERIN 0.4 MG SL SUBL: 0.4000 mg | SUBLINGUAL_TABLET | SUBLINGUAL | Status: DC | PRN

## 2018-10-26 MED ORDER — ALUM & MAG HYDROXIDE-SIMETH 200-200-20 MG/5ML PO SUSP
30.0000 mL | Freq: Once | ORAL | Status: AC
  Administered 2018-10-26: 30 mL via ORAL
  Filled 2018-10-26: qty 30

## 2018-10-26 MED ORDER — ONDANSETRON HCL 4 MG/2ML IJ SOLN: 4.0000 mg | Freq: Four times a day (QID) | INTRAMUSCULAR | Status: DC | PRN

## 2018-10-26 MED ORDER — ALBUTEROL SULFATE (2.5 MG/3ML) 0.083% IN NEBU: 2.5000 mg | INHALATION_SOLUTION | Freq: Four times a day (QID) | RESPIRATORY_TRACT | Status: DC | PRN

## 2018-10-26 NOTE — Progress Notes (Signed)
Patient direct admit [place]. Report from Lars Pinks. Patient alert and oriented x4. Tele box verified, vital signs taken, NP Gardiner Barefoot at bedside. Waiting for orders to be put in.    2000: standing orders for chest pain, RN Sharyn Lull R administering nitroglycerin.

## 2018-10-26 NOTE — H&P (Signed)
[facility] [place] at [facility]   PATIENT NAME: Tamara Hoffman    MR#:  [id number]  DATE OF BIRTH:  11/19/1965  DATE OF ADMISSION:  10/26/2018  PRIMARY CARE PHYSICIAN: Chrismon, Jodell Cipro, PA   REQUESTING/REFERRING PHYSICIAN: Yvonne Kendall, MD  CHIEF COMPLAINT:  No chief complaint on file.   HISTORY OF PRESENT ILLNESS:  Tamara Hoffman  is a 53 y.o. female with a known history of hypertension, GERD, borderline dilated ascending aorta (3.6 cm according to CT in 01/2014), remote history of GI bleed.  Hypertension has been treated with Norvasc and losartan however she continues to have elevated blood pressure despite medication changes.  She reports her blood pressure has remained in the 200s over 100s over the last week.  She had been sent to her cardiologist office by her primary care physician for recurrent chest pain over the last 3 days.  Chest pain is described as midsternal with no radiation of the pain.  She has noticed associated lightheadedness as well as shortness of breath, diaphoresis, and nausea.  No vomiting.  Pain is described as burning and pressure with pain score ranging from 6-10 out of 10 usually lasting about 3 hours.    She has no prior history of cardiac disease, MI.  However Her mother she has a family history with her mother having a history of CHF and father with a history of coronary artery disease having had coronary artery bypass grafting as well as a history of MI.  She reports increased stress recently with her 43 year old son returning home as well as the death of her mother the last year.  She has a history of tobacco abuse as well as a history of daily alcohol consumption.  She reports decreased alcohol consumption over the last 3 weeks.  She continues to smoke marijuana several times a week.   We have admitted her to the hospitalist service for further evaluation.  Cardiology has been consulted.  PAST MEDICAL HISTORY:   Past Medical  History:  Diagnosis Date  . GERD (gastroesophageal reflux disease)   . Hypertension     PAST SURGICAL HISTORY:   Past Surgical History:  Procedure Laterality Date  . LEEP      SOCIAL HISTORY:   Social History   Tobacco Use  . Smoking status: Current Every Day Smoker    Packs/day: 1.00    Years: 22.00    Pack years: 22.00    Types: Cigarettes  . Smokeless tobacco: Never Used  Substance Use Topics  . Alcohol use: Yes    Alcohol/week: 5.0 - 7.0 standard drinks    Types: 5 - 7 Cans of beer per week    Comment: now is almost every day/occasional    FAMILY HISTORY:   Family History  Problem Relation Age of Onset  . Diabetes Mother   . Heart Problems Mother   . Heart failure Mother   . Hypertension Father   . Heart Problems Father   . Coronary artery disease Father   . Diabetes Sister   . Heart murmur Sister   . Cancer Maternal Grandmother   . Heart murmur Sister     DRUG ALLERGIES:   Allergies  Allergen Reactions  . Phenergan [Promethazine Hcl] Swelling    REVIEW OF SYSTEMS:   Review of Systems  Constitutional: Negative for chills, fever and malaise/fatigue.  HENT: Negative for congestion and sore throat.   Eyes: Negative for blurred vision and double vision.  Respiratory: Positive for  shortness of breath. Negative for sputum production and wheezing.   Cardiovascular: Positive for chest pain, palpitations and leg swelling. Negative for orthopnea.  Gastrointestinal: Positive for heartburn and nausea. Negative for abdominal pain, diarrhea and vomiting.  Genitourinary: Negative for dysuria and urgency.  Musculoskeletal: Negative for myalgias and neck pain.  Skin: Negative for itching and rash.  Neurological: Negative for dizziness, weakness and headaches.  Psychiatric/Behavioral: Negative for depression.     MEDICATIONS AT HOME:   Prior to Admission medications   Medication Sig Start Date End Date Taking? Authorizing Provider  albuterol (PROVENTIL  HFA;VENTOLIN HFA) 108 (90 Base) MCG/ACT inhaler Inhale 2 puffs into the lungs every 6 (six) hours as needed for wheezing or shortness of breath. 12/07/17   Trinna Post, PA-C  amLODipine (NORVASC) 10 MG tablet Take 1 tablet (10 mg total) by mouth daily. 10/07/18   Mar Daring, PA-C  chlorthalidone (HYGROTON) 25 MG tablet Take 1 tablet (25 mg total) by mouth daily. Patient not taking: Reported on 10/26/2018 10/25/18   Chrismon, Vickki Muff, PA  losartan (COZAAR) 50 MG tablet Take 1 tablet (50 mg total) by mouth daily. Patient not taking: Reported on 10/26/2018 10/05/18   Mar Daring, PA-C  pantoprazole (PROTONIX) 40 MG tablet Take 1 tablet (40 mg total) by mouth daily. 10/25/18   Chrismon, Vickki Muff, PA  traZODone (DESYREL) 50 MG tablet Take 0.5-1 tablets (25-50 mg total) by mouth at bedtime as needed for sleep. 10/03/18   Mar Daring, PA-C      VITAL SIGNS:  Blood pressure (!) 158/81, pulse 85, temperature 98.6 F (37 C), temperature source Oral, resp. rate 18, height 5\' 4"  (1.626 m), weight 92.9 kg, SpO2 99 %.  PHYSICAL EXAMINATION:  Physical Exam  GENERAL:  53 y.o.-year-old patient lying in the bed with no acute distress.  EYES: Pupils equal, round, reactive to light and accommodation. No scleral icterus. Extraocular muscles intact.  HEENT: Head atraumatic, normocephalic. Oropharynx and nasopharynx clear.  NECK:  Supple, no jugular venous distention. No thyroid enlargement, no tenderness.  LUNGS: Normal breath sounds bilaterally, no wheezing, rales,rhonchi or crepitation. No use of accessory muscles of respiration.  CARDIOVASCULAR: Regular rate and rhythm, S1, S2 normal. No murmurs, rubs, or gallops.  ABDOMEN: Soft, nondistended, nontender. Bowel sounds present. No organomegaly or mass.  EXTREMITIES:trace pedal edema, cyanosis, or clubbing.  NEUROLOGIC: Cranial nerves II through XII are intact. Muscle strength 5/5 in all extremities. Sensation intact. Gait not checked.   PSYCHIATRIC: The patient is alert and oriented x 3.  Normal affect and good eye contact. SKIN: No obvious rash, lesion, or ulcer.   LABORATORY PANEL:   CBC Recent Labs  Lab 10/25/18 0926  WBC 7.7  HGB 15.8  HCT 46.9*  PLT 287   ------------------------------------------------------------------------------------------------------------------  Chemistries  Recent Labs  Lab 10/25/18 0926  NA 139  K 4.7  CL 101  CO2 21  GLUCOSE 107*  BUN 19  CREATININE 0.92  CALCIUM 9.8  AST 18  ALT 18  ALKPHOS 100  BILITOT 0.2   ------------------------------------------------------------------------------------------------------------------  Cardiac Enzymes No results for input(s): TROPONINI in the last 168 hours. ------------------------------------------------------------------------------------------------------------------  RADIOLOGY:  No results found.    IMPRESSION AND PLAN:     1.  Chest pain - CBC, BMP, trend troponins - Cardiology consulted - We will repeat EKG in the a.m. -Echocardiogram -Telemetry monitoring - Aspirin 81 mg daily - Lipid panel in the a.m. - Statin therapy initiated -Coreg 6.25 mg twice daily as recommended  by cardiology  2.  Uncontrolled hypertension - Norvasc 5 mg daily as recommended by cardiology -Continue losartan 50 mg - Telemetry monitoring  3.  Tobacco abuse - Tobacco cessation encouraged  4.  GERD - Protonix   5.  History of alcohol dependence - CIWA protocol  DVT and PPI prophylaxis initiated  All the records are reviewed and case discussed with ED provider. The plan of care was discussed in details with the patient (and family). I answered all questions. The patient agreed to proceed with the above mentioned plan. Further management will depend upon hospital course.   CODE STATUS: Full code  TOTAL TIME TAKING CARE OF THIS PATIENT:45 minutes.    Milas Kocherngela H Solon Alban CRNPon 10/26/2018 at 7:38 PM  Pager - [phone]   After 6pm go to [email or web address] - Social research officer, governmentpassword [email or web address]  [facility] [place] [facility]  Office  [phone]  CC: Primary care physician; Tamsen Roershrismon, Dennis E, PA   Note: This dictation was prepared with Dragon dictation along with smaller phrase technology. Any transcriptional errors that result from this process are unintentional.

## 2018-10-26 NOTE — Progress Notes (Signed)
New Outpatient Visit Date: 10/26/2018  Referring Provider: Tamsen Roers, PA [address] [place],  [place] [address]  Chief Complaint: Chest pain  HPI:  Tamara Hoffman is a 53 y.o. female who is being seen today for the evaluation of chest pain and uncontrolled hypertension at the request of Chrismon, Jodell Cipro, [place]. She has a history of hypertension, borderline dilated ascending aorta (3.6 cm by CT in 01/2014), GERD, and remote GI bleed.  She was seen yesterday by Mr. Chrismon after preceding ED visit for uncontrolled hypertension.  She noted continued elevated blood pressure despite medication changes 1 month earlier.  She also reported anhedonia and anxiety stemming from her mother's death a year ago and having to deal with her adult son.  Tamara Hoffman notes that her blood pressure has been running high for at least a month.  She was initially seen at her PCPs office about 3 weeks ago, one blood pressure was quite elevated.  This prompted addition of amlodipine and losartan.  Since then, Tamara Hoffman reports that she has not been feeling much better.  In fact, she has been feeling worse with leg edema and worsening fatigue.  During this time, she has also had frequent episodes of chest pain (including at rest).  She describes a tightness across her chest with associated shortness of breath.  She states that it feels "like an elephant sitting on my chest."  The discomfort and dyspnea seem to worsen with activity as well as when lying flat.  The pain seems to resolve on its own at night when she is asleep, but frequently returns in the afternoon.  She currently complains of 5/10 substernal chest discomfort.  The pain does not radiate.  She also notes a single episode of different chest pain that occurred in the setting of retching few days ago.  She had severe discomfort radiating to both arms that resolved over the course of about 15 seconds.  Tamara Hoffman denies a history of prior cardiac  disease and testing.  However, she is concerned given strong family history of premature ASCVD.  She also notes occasional palpitations, often feeling like her heart is pounding during episodes of chest pain.  She endorses occasional orthostatic lightheadedness but has not passed out or fallen.  She has been under quite a bit of stress over the last year after the death of her mother, whom she cared for.  Her son also recently moved back into a home that the patient was renting, which has further exacerbated her stress.  She was consuming a lot of alcohol earlier this year, but has cut back significantly over the last 3 weeks.  She is also been smoking marijuana from time to time to help "calm her nerves."  She last used this about a week ago.  BP has been running high.  Has not started chlorthalidone and protonix.  Having bad indigestion/heartburn since startd the medication.  Also feels sleepy.  Also taken trazodone for sleep.  Exhausted when gets home from [employer] job.  Swelling since started BP medication.  Gained 52 pounds in the last year.  Was drinking heavily with COVID-19 and mother's death.  Last two days, felt like heart was pounding and uncomfortable in chest.  Feels like tightness in her chest; like an elephant sitting.  Worse with panicing. 5/10.  Worse with exertion.  Anything makes it worse.  Heart racing sensation.  No shortness of breath.  Swelling in hands and feet.  + orthopnea.  Propped up some yesterday.  --------------------------------------------------------------------------------------------------  Cardiovascular History & Procedures: Cardiovascular Problems:  Chest pain  Borderline dilated ascending aorta  Risk Factors:  Hypertension, tobacco use, and obesity  Cath/PCI:  None  CV Surgery:  None  EP Procedures and Devices:  None  Non-Invasive Evaluation(s):  CT chest (02/22/2014 - images not available): Mild pulmonary parenchymal contusions in both  lungs.  No evidence for traumatic injury in the chest.  Mild dilation of the ascending aorta measuring 3.6 cm.  Recent CV Pertinent Labs: Lab Results  Component Value Date   K 4.7 10/25/2018   K 3.6 02/22/2014   BUN 19 10/25/2018   BUN 7 02/22/2014   CREATININE 0.92 10/25/2018   CREATININE 0.72 02/22/2014    --------------------------------------------------------------------------------------------------  Past Medical History:  Diagnosis Date   GERD (gastroesophageal reflux disease)    Hypertension     Past Surgical History:  Procedure Laterality Date   LEEP      Current Meds  Medication Sig   albuterol (PROVENTIL HFA;VENTOLIN HFA) 108 (90 Base) MCG/ACT inhaler Inhale 2 puffs into the lungs every 6 (six) hours as needed for wheezing or shortness of breath.   amLODipine (NORVASC) 10 MG tablet Take 1 tablet (10 mg total) by mouth daily.   pantoprazole (PROTONIX) 40 MG tablet Take 1 tablet (40 mg total) by mouth daily.   traZODone (DESYREL) 50 MG tablet Take 0.5-1 tablets (25-50 mg total) by mouth at bedtime as needed for sleep.    Allergies: Phenergan [promethazine hcl]  Social History   Tobacco Use   Smoking status: Current Every Day Smoker    Packs/day: 1.00    Years: 22.00    Pack years: 22.00    Types: Cigarettes   Smokeless tobacco: Never Used  Substance Use Topics   Alcohol use: Yes    Alcohol/week: 5.0 - 7.0 standard drinks    Types: 5 - 7 Cans of beer per week    Comment: now is almost every day/occasional   Drug use: Not Currently    Types: Marijuana    Comment: Last used 1 week ago.    Family History  Problem Relation Age of Onset   Diabetes Mother    Heart Problems Mother    Heart failure Mother    Hypertension Father    Heart Problems Father    Coronary artery disease Father    Diabetes Sister    Heart murmur Sister    Cancer Maternal Grandmother    Heart murmur Sister     Review of Systems: A 12-system review of  systems was performed and was negative except as noted in the HPI.  --------------------------------------------------------------------------------------------------  Physical Exam: BP 128/82 (BP Location: Right Arm, Patient Position: Sitting, Cuff Size: Normal)    Pulse 91    Ht 5\' 4"  (1.626 m)    Wt 207 lb 8 oz (94.1 kg)    BMI 35.62 kg/m   General: NAD. HEENT: No conjunctival pallor or scleral icterus.  Facemask in place. Neck: Supple without lymphadenopathy, thyromegaly, JVD, or HJR. No carotid bruit. Lungs: Normal work of breathing. Clear to auscultation bilaterally without wheezes or crackles. Heart: Regular rate and rhythm without murmurs, rubs, or gallops. Non-displaced PMI. Abd: Bowel sounds present. Soft, NT/ND without hepatosplenomegaly Ext: No lower extremity edema. Radial, PT, and DP pulses are 2+ bilaterally Skin: Warm and dry without rash. Neuro: CNIII-XII intact. Strength and fine-touch sensation intact in upper and lower extremities bilaterally. Psych: Normal mood and affect.  EKG: Normal sinus rhythm  without abnormality.  Lab Results  Component Value Date   WBC 7.7 10/25/2018   HGB 15.8 10/25/2018   HCT 46.9 (H) 10/25/2018   MCV 90 10/25/2018   PLT 287 10/25/2018    Lab Results  Component Value Date   NA 139 10/25/2018   K 4.7 10/25/2018   CL 101 10/25/2018   CO2 21 10/25/2018   BUN 19 10/25/2018   CREATININE 0.92 10/25/2018   GLUCOSE 107 (H) 10/25/2018   ALT 18 10/25/2018    No results found for: CHOL, HDL, LDLCALC, LDLDIRECT, TRIG, CHOLHDL   --------------------------------------------------------------------------------------------------  ASSESSMENT AND PLAN: Unstable angina: Patient reports chest pain over the last month, frequently present at rest.  She has ongoing 5/10 chest discomfort right now with associated dyspnea.  EKG is and exam are normal.  However, she has multiple cardiac risk factors, including uncontrolled hypertension, family  history of premature ASCVD, polysubstance abuse, and obesity.  I have recommended direct admission to the hospital for pain control and serial troponins to exclude ongoing ischemia.  If her troponins remain negative, I recommend proceeding with a pharmacologic myocardial perfusion stress test tomorrow.  If she has objective signs of ischemia such as dynamic EKG changes or rising troponin, I recommend initiation of heparin infusion with plans for cardiac catheterization tomorrow.  In the meantime, the patient should remain on low-dose aspirin (324 mg given x1 in the clinic today).  I recommend addition of carvedilol 6.25 mg twice daily, as well as statin therapy.  Fasting lipid panel should be obtained in the morning.  Management of concurrent anxiety may also be helpful.  Hypertension: Blood pressure reasonably well controlled today, though it has been suboptimally controlled over the last few weeks.  Patient is currently on amlodipine and losartan; chlorthalidone was added yesterday but has yet to be picked up.  Given leg swelling, I favor de-escalation of amlodipine to 5 mg daily (hopefully we can ultimately discontinue this medication).  I would start carvedilol 6.25 mg twice daily and continue losartan 50 mg daily.  Chlorthalidone could be added based on blood pressure response during the hospitalization.  Borderline enlarged thoracic aorta: Borderline dilation of a sending aorta noted by CT chest in 2015.  Further evaluation with echo versus cross-sectional imaging recommended.  Polysubstance abuse: Patient continues to smoke and has a history of alcohol use (previously heavy up until the last few weeks) and intermittent marijuana use.  Importance of cessation encouraged.  Urine drug screen should be checked on admission.  Follow-up: To be determined based on hospital course.  Yvonne Kendallhristopher Rocko Fesperman, MD 10/26/2018 5:31 PM

## 2018-10-27 ENCOUNTER — Inpatient Hospital Stay (HOSPITAL_COMMUNITY): Payer: BLUE CROSS/BLUE SHIELD

## 2018-10-27 ENCOUNTER — Inpatient Hospital Stay (HOSPITAL_COMMUNITY)
Admission: AD | Admit: 2018-10-27 | Discharge: 2018-10-27 | Disposition: A | Discharge status: Home / Self Care | Payer: BLUE CROSS/BLUE SHIELD | Source: Ambulatory Visit | Attending: Nurse Practitioner | Admitting: Nurse Practitioner

## 2018-10-27 ENCOUNTER — Other Ambulatory Visit: Payer: Self-pay

## 2018-10-27 ENCOUNTER — Telehealth: Payer: Self-pay

## 2018-10-27 DIAGNOSIS — F1910 Other psychoactive substance abuse, uncomplicated: Secondary | ICD-10-CM

## 2018-10-27 DIAGNOSIS — R079 Chest pain, unspecified: Secondary | ICD-10-CM

## 2018-10-27 DIAGNOSIS — I10 Essential (primary) hypertension: Secondary | ICD-10-CM

## 2018-10-27 DIAGNOSIS — F419 Anxiety disorder, unspecified: Secondary | ICD-10-CM

## 2018-10-27 DIAGNOSIS — F1010 Alcohol abuse, uncomplicated: Secondary | ICD-10-CM

## 2018-10-27 DIAGNOSIS — F17200 Nicotine dependence, unspecified, uncomplicated: Secondary | ICD-10-CM

## 2018-10-27 DIAGNOSIS — I259 Chronic ischemic heart disease, unspecified: Secondary | ICD-10-CM

## 2018-10-27 LAB — LIPID PANEL
Cholesterol: 181 mg/dL (ref 0–200)
HDL: 52 mg/dL (ref >40)
LDL Cholesterol: 101 mg/dL — ABNORMAL HIGH (ref 0–99)
Total CHOL/HDL Ratio: 3.5 RATIO
Triglycerides: 139 mg/dL (ref <150)
VLDL: 28 mg/dL (ref 0–40)

## 2018-10-27 LAB — NM MYOCAR MULTI W/SPECT W/WALL MOTION / EF
Estimated workload: 1 METS
Exercise duration (min): 0 min
Exercise duration (sec): 0 s
LV dias vol: 93 mL (ref 46–106)
LV sys vol: 50 mL
MPHR: 168 {beats}/min
Peak HR: 106 {beats}/min
Percent HR: 63 %
Rest HR: 80 {beats}/min
SDS: 1
SRS: 1
SSS: 3
TID: 1.09

## 2018-10-27 LAB — SARS CORONAVIRUS 2 (TAT 6-24 HRS): SARS Coronavirus 2: NEGATIVE

## 2018-10-27 LAB — ECHOCARDIOGRAM COMPLETE
Height: 64 in
Weight: 3254.4 oz

## 2018-10-27 MED ORDER — LORAZEPAM 1 MG PO TABS: 1.0000 mg | ORAL_TABLET | Freq: Four times a day (QID) | ORAL | Status: DC | PRN

## 2018-10-27 MED ORDER — TECHNETIUM TC 99M TETROFOSMIN IV KIT
30.0000 | PACK | Freq: Once | INTRAVENOUS | Status: AC | PRN
  Administered 2018-10-27: 29.854 via INTRAVENOUS

## 2018-10-27 MED ORDER — THIAMINE HCL 100 MG/ML IJ SOLN: 100.0000 mg | Freq: Every day | INTRAMUSCULAR | Status: DC

## 2018-10-27 MED ORDER — ADULT MULTIVITAMIN W/MINERALS CH
1.0000 | ORAL_TABLET | Freq: Every day | ORAL | Status: DC
  Administered 2018-10-27: 1 via ORAL
  Filled 2018-10-27 (×2): qty 1

## 2018-10-27 MED ORDER — TECHNETIUM TC 99M TETROFOSMIN IV KIT
10.0000 | PACK | Freq: Once | INTRAVENOUS | Status: AC | PRN
  Administered 2018-10-27: 10.79 via INTRAVENOUS

## 2018-10-27 MED ORDER — CHLORTHALIDONE 25 MG PO TABS
25.0000 mg | ORAL_TABLET | Freq: Every day | ORAL | Status: DC
  Administered 2018-10-27: 25 mg via ORAL
  Filled 2018-10-27: qty 1

## 2018-10-27 MED ORDER — HYDRALAZINE HCL 20 MG/ML IJ SOLN
10.0000 mg | Freq: Four times a day (QID) | INTRAMUSCULAR | Status: DC | PRN
  Administered 2018-10-27: 10 mg via INTRAVENOUS
  Filled 2018-10-27: qty 1

## 2018-10-27 MED ORDER — REGADENOSON 0.4 MG/5ML IV SOLN
0.4000 mg | Freq: Once | INTRAVENOUS | Status: AC
  Administered 2018-10-27: 0.4 mg via INTRAVENOUS

## 2018-10-27 MED ORDER — LORAZEPAM 2 MG/ML IJ SOLN: 1.0000 mg | Freq: Four times a day (QID) | INTRAMUSCULAR | Status: DC | PRN

## 2018-10-27 MED ORDER — VITAMIN B-1 100 MG PO TABS
100.0000 mg | ORAL_TABLET | Freq: Every day | ORAL | Status: DC
  Administered 2018-10-27: 100 mg via ORAL
  Filled 2018-10-27 (×3): qty 1

## 2018-10-27 MED ORDER — CHLORTHALIDONE 25 MG PO TABS
25.0000 mg | ORAL_TABLET | Freq: Every day | ORAL | Status: DC
  Filled 2018-10-27: qty 1

## 2018-10-27 MED ORDER — FOLIC ACID 1 MG PO TABS
1.0000 mg | ORAL_TABLET | Freq: Every day | ORAL | Status: DC
  Administered 2018-10-27: 1 mg via ORAL
  Filled 2018-10-27 (×2): qty 1

## 2018-10-27 NOTE — Plan of Care (Signed)
  Problem: Activity: Goal: Ability to tolerate increased activity will improve Outcome: Progressing   Problem: Cardiac: Goal: Ability to achieve and maintain adequate cardiovascular perfusion will improve Outcome: Progressing   Problem: Education: Goal: Knowledge of General Education information will improve Description: Including pain rating scale, medication(s)/side effects and non-pharmacologic comfort measures Outcome: Progressing   

## 2018-10-27 NOTE — Consult Note (Signed)
Cardiology Consultation:   Patient ID: Tamara Hoffman MRN: [id number]; DOB: 03-03-1965  Admit date: 10/26/2018 Date of Consult: 10/27/2018  Primary Care Provider: Tamsen Roers, PA Primary Cardiologist: Dr. Okey Dupre Physician requesting consult: Janeann Merl Reason for consult: Chest pain, essential hypertension   Patient Profile:   Tamara Hoffman is a 53 y.o. female with a hx  uncontrolled hypertension  borderline dilated ascending aorta (3.6 cm by CT in 01/2014), GERD,   remote GI bleed Presenting to the hospital after being seen in the cardiology clinic yesterday for evaluation of chest pain, uncontrolled hypertension   History of Present Illness:   [name]  Patient reports strong family history of premature coronary disease, She is a longtime smoker, hyperlipidemia Stressors at home, son has moved back in causing stress High alcohol intake earlier this year, has cut back in the past several weeks Also smokes marijuana to calm her nerves last used 1 week ago Having GERD symptoms, has not started a PPI as directed by primary care Has not started chlorthalidone for blood pressure as recommended by primary care  Gained 52 pounds in the last year.   Reports that past several days has had very strong heartbeat, uncomfortable feeling in her chest, Chest feels tight like elephant sitting Causing some anxiety and panic, 5/10 in intensity Feels like chest is worse with exertion Also worsening swelling in hands and feet, orthopnea positive  Chest pain was present when she was seen in cardiology clinic yesterday and she was referred to the emergency room for further evaluation and stress testing if admitted  Further discussion concerning her chest tightness, she describes it as a tightness with some shortness of breath Feels like an elephant,  discomfort and dyspnea seem to worsen with activity as well as when lying flat.  The pain seems to resolve on its own at night when she  is asleep, but frequently returns in the afternoon.    different chest pain that occurred in the setting of retching several days ago, at that time had severe discomfort radiating to both arms that resolved over the course of about 15 seconds.    Heart Pathway Score:     Past Medical History:  Diagnosis Date  . GERD (gastroesophageal reflux disease)   . Hypertension     Past Surgical History:  Procedure Laterality Date  . LEEP       Home Medications:  Prior to Admission medications   Medication Sig Start Date End Date Taking? Authorizing Provider  albuterol (PROVENTIL HFA;VENTOLIN HFA) 108 (90 Base) MCG/ACT inhaler Inhale 2 puffs into the lungs every 6 (six) hours as needed for wheezing or shortness of breath. 12/07/17   Trey Sailors, PA-C  amLODipine (NORVASC) 10 MG tablet Take 1 tablet (10 mg total) by mouth daily. 10/07/18   Margaretann Loveless, PA-C  chlorthalidone (HYGROTON) 25 MG tablet Take 1 tablet (25 mg total) by mouth daily. Patient not taking: Reported on 10/26/2018 10/25/18   Chrismon, Jodell Cipro, PA  losartan (COZAAR) 50 MG tablet Take 1 tablet (50 mg total) by mouth daily. Patient not taking: Reported on 10/26/2018 10/05/18   Margaretann Loveless, PA-C  pantoprazole (PROTONIX) 40 MG tablet Take 1 tablet (40 mg total) by mouth daily. 10/25/18   Chrismon, Jodell Cipro, PA  traZODone (DESYREL) 50 MG tablet Take 0.5-1 tablets (25-50 mg total) by mouth at bedtime as needed for sleep. 10/03/18   Margaretann Loveless, PA-C    Inpatient Medications: Scheduled Meds: .  amLODipine  5 mg Oral Daily  . aspirin EC  81 mg Oral Daily  . atorvastatin  40 mg Oral q1800  . carvedilol  6.25 mg Oral BID WC  . chlorthalidone  25 mg Oral Daily  . folic acid  1 mg Oral Daily  . losartan  50 mg Oral Daily  . multivitamin with minerals  1 tablet Oral Daily  . pantoprazole  40 mg Oral Daily  . thiamine  100 mg Oral Daily   Or  . thiamine  100 mg Intravenous Daily   Continuous Infusions:   PRN Meds: acetaminophen, albuterol, hydrALAZINE, LORazepam **OR** LORazepam, nitroGLYCERIN, ondansetron (ZOFRAN) IV, traZODone  Allergies:    Allergies  Allergen Reactions  . Phenergan [Promethazine Hcl] Swelling    Social History:   Social History   Socioeconomic History  . Marital status: Married    Spouse name: Not on file  . Number of children: Not on file  . Years of education: Not on file  . Highest education level: Not on file  Occupational History  . Not on file  Social Needs  . Financial resource strain: Not on file  . Food insecurity    Worry: Not on file    Inability: Not on file  . Transportation needs    Medical: Not on file    Non-medical: Not on file  Tobacco Use  . Smoking status: Current Every Day Smoker    Packs/day: 1.00    Years: 22.00    Pack years: 22.00    Types: Cigarettes  . Smokeless tobacco: Never Used  Substance and Sexual Activity  . Alcohol use: Yes    Alcohol/week: 5.0 - 7.0 standard drinks    Types: 5 - 7 Cans of beer per week    Comment: now is almost every day/occasional  . Drug use: Not Currently    Types: Marijuana    Comment: Last used 1 week ago.  Marland Kitchen. Sexual activity: Not on file  Lifestyle  . Physical activity    Days per week: Not on file    Minutes per session: Not on file  . Stress: Not on file  Relationships  . Social Musicianconnections    Talks on phone: Not on file    Gets together: Not on file    Attends religious service: Not on file    Active member of club or organization: Not on file    Attends meetings of clubs or organizations: Not on file    Relationship status: Not on file  . Intimate partner violence    Fear of current or ex partner: Not on file    Emotionally abused: Not on file    Physically abused: Not on file    Forced sexual activity: Not on file  Other Topics Concern  . Not on file  Social History Narrative   Independent at baseline. Son lives with her.    Family History:    Family History   Problem Relation Age of Onset  . Diabetes Mother   . Heart Problems Mother   . Heart failure Mother   . Hypertension Father   . Heart Problems Father   . Coronary artery disease Father   . Diabetes Sister   . Heart murmur Sister   . Cancer Maternal Grandmother   . Heart murmur Sister      ROS:  Please see the history of present illness.  Review of Systems  Constitutional: Negative.   HENT: Negative.   Respiratory: Negative.  Cardiovascular: Positive for chest pain and palpitations.  Gastrointestinal: Negative.   Musculoskeletal: Negative.   Neurological: Negative.   Psychiatric/Behavioral: The patient is nervous/anxious.   All other systems reviewed and are negative.   Physical Exam/Data:   Vitals:   10/26/18 2003 10/27/18 0442 10/27/18 0444 10/27/18 0825  BP: (!) 168/100  (!) 152/107 (!) 144/90  Pulse: 87  79 72  Resp:   16 20  Temp: 97.7 F (36.5 C)  97.6 F (36.4 C) 97.7 F (36.5 C)  TempSrc:   Oral Oral  SpO2: 99%  97% 100%  Weight:  92.3 kg    Height:       No intake or output data in the 24 hours ending 10/27/18 1117 Last 3 Weights 10/27/2018 10/26/2018 10/26/2018  Weight (lbs) 203 lb 6.4 oz 204 lb 11.2 oz 207 lb 8 oz  Weight (kg) 92.262 kg 92.851 kg 94.121 kg     Body mass index is 34.91 kg/m.  General:  Well nourished, well developed, in no acute distress, obese HEENT: normal Lymph: no adenopathy Neck: no JVD Endocrine:  No thryomegaly Vascular: No carotid bruits; FA pulses 2+ bilaterally without bruits  Cardiac:  normal S1, S2; RRR; no murmur  Lungs:  clear to auscultation bilaterally,  Scattered wheezes Abd: soft, nontender, no hepatomegaly  Ext: no edema Musculoskeletal:  No deformities, BUE and BLE strength normal and equal Skin: warm and dry  Neuro:  CNs 2-12 intact, no focal abnormalities noted Psych:  Normal affect   EKG:  The EKG was personally reviewed and demonstrates:   Normal sinus rhythm with rate 74 bpm no significant ST or T  wave changes Telemetry:  Telemetry was personally reviewed and demonstrates:   Normal sinus rhythm  Relevant CV Studies: Echocardiogram and stress test pending  Laboratory Data:  High Sensitivity Troponin:   Recent Labs  Lab 10/08/18 0837 10/08/18 1103 10/26/18 1954 10/26/18 2152  TROPONINIHS 5 4 7 7      Chemistry Recent Labs  Lab 10/25/18 0926 10/26/18 1954  NA 139 139  K 4.7 4.5  CL 101 104  CO2 21 25  GLUCOSE 107* 97  BUN 19 19  CREATININE 0.92 0.83  CALCIUM 9.8 9.5  GFRNONAA 72 >60  GFRAA 83 >60  ANIONGAP  --  10    Recent Labs  Lab 10/25/18 0926 10/26/18 1954  PROT 7.0 7.2  ALBUMIN 4.6 4.0  AST 18 21  ALT 18 21  ALKPHOS 100 84  BILITOT 0.2 0.5   Hematology Recent Labs  Lab 10/25/18 0926  WBC 7.7  RBC 5.20  HGB 15.8  HCT 46.9*  MCV 90  MCH 30.4  MCHC 33.7  RDW 12.0  PLT 287   BNPNo results for input(s): BNP, PROBNP in the last 168 hours.  DDimer No results for input(s): DDIMER in the last 168 hours.   Radiology/Studies:  Dg Chest 2 View  Result Date: 10/26/2018 CLINICAL DATA:  53 year old female with chest pain. EXAM: CHEST - 2 VIEW COMPARISON:  Chest radiograph dated 10/08/2018 FINDINGS: The heart size and mediastinal contours are within normal limits. Both lungs are clear. The visualized skeletal structures are unremarkable. IMPRESSION: No active cardiopulmonary disease. Electronically Signed   By: Anner Crete M.D.   On: 10/26/2018 21:34    Assessment and Plan:   1.  Chest pain Some typical and atypical features Normal EKG, cardiac enzymes negative Given long smoking history, premature coronary disease in family members, nagging pain for quite some time, recommended ischemic  work-up -She is in agreement, we have scheduled her for pharmacologic Myoview  2.  Anxiety/stress Son has moved back home, could be contributing to some of her symptoms Also loss of mother earlier in the year Recommend she follow-up with primary care  Self-medicating with marijuana and alcohol  3.  Alcohol abuse Cessation recommended, self-medicating Also marijuana  4.  Smoker We have encouraged her to continue to work on weaning her cigarettes and smoking cessation. She will continue to work on this and does not want any assistance with chantix.   5.  Essential hypertension We will likely need to increase the losartan or amlodipine or both given her hypertension which is poorly controlled Stressed importance of medication compliance  6.  Ascending aorta dilation Echocardiogram pending to assess this admission Previously mildly dilated   Total encounter time more than 110 minutes  Greater than 50% was spent in counseling and coordination of care with the patient     For questions or updates, please contact [facility] Please consult [email or web address] for contact info under     Signed, Julien Nordmannimothy Harla Mensch, MD  10/27/2018 11:17 AM

## 2018-10-27 NOTE — Progress Notes (Signed)
Advanced care plan. Purpose of the Encounter: CODE STATUS Parties in Attendance: Patient Patient's Decision Capacity: Good Subjective/Patient's story: Tamara Hoffman  is a 53 y.o. female with a known history of hypertension, GERD, borderline dilated ascending aorta (3.6 cm according to CT in 01/2014), remote history of GI bleed.  Hypertension has been treated with Norvasc and losartan however she continues to have elevated blood pressure despite medication changes.  She reports her blood pressure has remained in the 200s over 100s over the last week.  She had been sent to her cardiologist office by her primary care physician for recurrent chest pain over the last 3 days.  Chest pain is described as midsternal with no radiation of the pain.  She has noticed associated lightheadedness as well as shortness of breath, diaphoresis, and nausea.  No vomiting.  Pain is described as burning and pressure with pain score ranging from 6-10 out of 10 usually lasting about 3 hours.   She has no prior history of cardiac disease, MI.  However Her mother she has a family history with her mother having a history of CHF and father with a history of coronary artery disease having had coronary artery bypass grafting as well as a history of MI.  She reports increased stress recently with her 53 year old son returning home as well as the death of her mother the last year.  She has a history of tobacco abuse as well as a history of daily alcohol consumption.  She reports decreased alcohol consumption over the last 3 weeks.  She continues to smoke marijuana several times a week.  We have admitted her to the hospitalist service for further evaluation.  Cardiology has been consulted. Objective/Medical story Patient needs serial troponin monitoring echocardiogram Needs cardiology evaluation and work-up with cardiac stress test Goals of care determination:  Advance care directives goals of care treatment plan discussed Patient wants  everything done which includes CPR, intubation ventilator if the need arises CODE STATUS: Full code Time spent discussing advanced care planning: 16 minutes

## 2018-10-27 NOTE — Discharge Summary (Signed)
[employer] [facility] [facility] [place] [facility]   PATIENT NAME: Tamara Hoffman    MR#:  [id number]  DATE OF BIRTH:  11/11/1965  DATE OF ADMISSION:  10/26/2018 ADMITTING PHYSICIAN: Houston SirenVivek J Sainani, MD  DATE OF DISCHARGE: 10/27/2018  PRIMARY CARE PHYSICIAN: Chrismon, Jodell Ciproennis E, PA   ADMISSION DIAGNOSIS:  Chest pain   DISCHARGE DIAGNOSIS:  Active Problems:   Chest pain Atypical chest pain  Hypertension GERD  SECONDARY DIAGNOSIS:   Past Medical History:  Diagnosis Date  . GERD (gastroesophageal reflux disease)   . Hypertension      ADMITTING HISTORY Tamara Hoffman  is a 53 y.o. female with a known history of hypertension, GERD, borderline dilated ascending aorta (3.6 cm according to CT in 01/2014), remote history of GI bleed.  Hypertension has been treated with Norvasc and losartan however she continues to have elevated blood pressure despite medication changes.  She reports her blood pressure has remained in the 200s over 100s over the last week.  She had been sent to her cardiologist office by her primary care physician for recurrent chest pain over the last 3 days.  Chest pain is described as midsternal with no radiation of the pain.  She has noticed associated lightheadedness as well as shortness of breath, diaphoresis, and nausea.  No vomiting.  Pain is described as burning and pressure with pain score ranging from 6-10 out of 10 usually lasting about 3 hours.   She has no prior history of cardiac disease, MI.  However Her mother she has a family history with her mother having a history of CHF and father with a history of coronary artery disease having had coronary artery bypass grafting as well as a history of MI.  She reports increased stress recently with her 747 year old son returning home as well as the death of her mother the last year.  She has a history of tobacco abuse as well as a history of daily alcohol consumption.  She reports decreased alcohol consumption over the last 3  weeks.  She continues to smoke marijuana several times a week.   HOSPITAL COURSE:  Patient admitted to telemetry.  Serial troponins were negative.  Patient was seen by cardiology and worked up with cardiac stress test which did not show any ischemia.  Study was normal.  Patient blood pressure was better controlled.  Patient will be discharged home follow-up with primary care physician in the clinic.  Echocardiogram was also reviewed.  Stress test showed EF of 73% with no wall motion abnormality.  CONSULTS OBTAINED:  Treatment Team:  Dolores PattyBensimhon, Daniel R, MD End, Cristal Deerhristopher, MD  DRUG ALLERGIES:   Allergies  Allergen Reactions  . Phenergan [Promethazine Hcl] Swelling    DISCHARGE MEDICATIONS:   Allergies as of 10/27/2018      Reactions   Phenergan [promethazine Hcl] Swelling      Medication List    STOP taking these medications   chlorthalidone 25 MG tablet Commonly known as: HYGROTON   losartan 50 MG tablet Commonly known as: COZAAR     TAKE these medications   albuterol 108 (90 Base) MCG/ACT inhaler Commonly known as: VENTOLIN HFA Inhale 2 puffs into the lungs every 6 (six) hours as needed for wheezing or shortness of breath.   amLODipine 10 MG tablet Commonly known as: NORVASC Take 1 tablet (10 mg total) by mouth daily.   pantoprazole 40 MG tablet Commonly known as: PROTONIX Take 1 tablet (40 mg total) by mouth daily.   traZODone 50  MG tablet Commonly known as: DESYREL Take 0.5-1 tablets (25-50 mg total) by mouth at bedtime as needed for sleep.       Today  Patient seen today No complaints of chest pain Tolerating diet well Tobacco cessation counseled to the patient for 6 minutes Nicotine patch offered Hemodynamically stable.  VITAL SIGNS:  Blood pressure 135/72, pulse 82, temperature 97.9 F (36.6 C), temperature source Oral, resp. rate 19, height 5\' 4"  (1.626 m), weight 92.3 kg, SpO2 100 %.  I/O:  No intake or output data in the 24 hours ending  10/27/18 1406  PHYSICAL EXAMINATION:  Physical Exam  GENERAL:  53 y.o.-year-old patient lying in the bed with no acute distress.  LUNGS: Normal breath sounds bilaterally, no wheezing, rales,rhonchi or crepitation. No use of accessory muscles of respiration.  CARDIOVASCULAR: S1, S2 normal. No murmurs, rubs, or gallops.  ABDOMEN: Soft, non-tender, non-distended. Bowel sounds present. No organomegaly or mass.  NEUROLOGIC: Moves all 4 extremities. PSYCHIATRIC: The patient is alert and oriented x 3.  SKIN: No obvious rash, lesion, or ulcer.   DATA REVIEW:   CBC Recent Labs  Lab 10/25/18 0926  WBC 7.7  HGB 15.8  HCT 46.9*  PLT 287    Chemistries  Recent Labs  Lab 10/26/18 1954  NA 139  K 4.5  CL 104  CO2 25  GLUCOSE 97  BUN 19  CREATININE 0.83  CALCIUM 9.5  AST 21  ALT 21  ALKPHOS 84  BILITOT 0.5    Cardiac Enzymes No results for input(s): TROPONINI in the last 168 hours.  Microbiology Results  Results for orders placed or performed in visit on 08/02/18  Novel Coronavirus, NAA ([facility])     Status: None   Collection Time: 08/02/18  8:09 AM  Result Value Ref Range Status   SARS-CoV-2, NAA Not Detected Not Detected Final    Comment: Testing was performed using the Aptima SARS-CoV-2 assay. This test was developed and its performance characteristics determined by [employer]. This test has not been FDA cleared or approved. This test has been authorized by FDA under an Emergency Use Authorization (EUA). This test is only authorized for the duration of time the declaration that circumstances exist justifying the authorization of the emergency use of in vitro diagnostic tests for detection of SARS-CoV-2 virus and/or diagnosis of COVID-19 infection under section 564(b)(1) of the Act, 21 U.S.C. 623JSE-8(B)(1), unless the authorization is terminated or revoked sooner. When diagnostic testing is negative, the possibility of a false negative result should be  considered in the context of a patient's recent exposures and the presence of clinical signs and symptoms consistent with COVID-19. An individual without symptoms of COVID-19 and who is not shedding SARS-CoV-2 virus would expect to have a negativ e (not detected) result in this assay.     RADIOLOGY:  Dg Chest 2 View  Result Date: 10/26/2018 CLINICAL DATA:  53 year old female with chest pain. EXAM: CHEST - 2 VIEW COMPARISON:  Chest radiograph dated 10/08/2018 FINDINGS: The heart size and mediastinal contours are within normal limits. Both lungs are clear. The visualized skeletal structures are unremarkable. IMPRESSION: No active cardiopulmonary disease. Electronically Signed   By: Elgie Collard M.D.   On: 10/26/2018 21:34   Nm Myocar Multi W/spect W/wall Motion / Ef  Result Date: 10/27/2018 Pharmacological myocardial perfusion imaging study with no significant  ischemia Normal wall motion, EF estimated at 73% No EKG changes concerning for ischemia at peak stress or in recovery. Low risk scan Signed, Tim  Rockey Situ, MD, Ph.D [facility]    Follow up with PCP in 1 week.  Management plans discussed with the patient, family and they are in agreement.  CODE STATUS: Full code    Code Status Orders  (From admission, onward)         Start     Ordered   10/26/18 1938  Full code  Continuous     10/26/18 1937        Code Status History    Date Active Date Inactive Code Status Order ID Comments User Context   02/17/2017 1601 02/18/2017 1338 Full Code [id number]  Gladstone Lighter, MD Inpatient   Advance Care Planning Activity      TOTAL TIME TAKING CARE OF THIS PATIENT ON DAY OF DISCHARGE: more than 35 minutes.   Saundra Shelling M.D on 10/27/2018 at 2:06 PM  Between 7am to 6pm - Pager - [phone]  After 6pm go to [email or web address] - password [email or web address] [facility]  Office  [phone]  CC: Primary care physician; Margo Common, PA  Note: This dictation  was prepared with Dragon dictation along with smaller phrase technology. Any transcriptional errors that result from this process are unintentional.

## 2018-10-27 NOTE — Progress Notes (Signed)
*  PRELIMINARY RESULTS* Echocardiogram 2D Echocardiogram has been performed.  Tamara Hoffman 10/27/2018, 2:43 PM

## 2018-10-27 NOTE — Telephone Encounter (Signed)
-----   Message from Margo Common, [place] sent at 10/26/2018 11:34 PM EDT ----- Labs essentially normal. Proceed with cardiac work up as advised by Dr. Saunders Revel.

## 2018-10-27 NOTE — Telephone Encounter (Signed)
Unable to leave message.  Phone would not connect

## 2018-10-27 NOTE — Progress Notes (Signed)
Patient discharged to home. Tele and IV d/c'd. Patient verbalizes understanding of discharge instructions. 

## 2018-10-28 LAB — HIV ANTIBODY (ROUTINE TESTING W REFLEX): HIV Screen 4th Generation wRfx: NONREACTIVE

## 2018-10-31 ENCOUNTER — Ambulatory Visit (INDEPENDENT_AMBULATORY_CARE_PROVIDER_SITE_OTHER): Payer: BLUE CROSS/BLUE SHIELD | Admitting: Family Medicine

## 2018-10-31 ENCOUNTER — Telehealth: Payer: Self-pay

## 2018-10-31 ENCOUNTER — Encounter: Payer: Self-pay | Admitting: Family Medicine

## 2018-10-31 ENCOUNTER — Other Ambulatory Visit: Payer: Self-pay

## 2018-10-31 VITALS — BP 200/100 | HR 107 | Temp 97.3°F

## 2018-10-31 DIAGNOSIS — H5789 Other specified disorders of eye and adnexa: Secondary | ICD-10-CM | POA: Diagnosis not present

## 2018-10-31 DIAGNOSIS — I10 Essential (primary) hypertension: Secondary | ICD-10-CM

## 2018-10-31 DIAGNOSIS — H579 Unspecified disorder of eye and adnexa: Secondary | ICD-10-CM

## 2018-10-31 DIAGNOSIS — H11121 Conjunctival concretions, right eye: Secondary | ICD-10-CM | POA: Diagnosis not present

## 2018-10-31 MED ORDER — CHLORTHALIDONE 50 MG PO TABS: 50.0000 mg | ORAL_TABLET | Freq: Every day | ORAL | 2 refills | Status: DC

## 2018-10-31 NOTE — Telephone Encounter (Signed)
-----   Message from Margo Common, [place] sent at 10/28/2018  8:20 AM EDT ----- Negative HIV antibody screening test.

## 2018-10-31 NOTE — Progress Notes (Signed)
Patient: Tamara Hoffman Female    DOB: 12/14/1965   53 y.o.   MRN: [id number] Visit Date: 10/31/2018  Today's Provider: Lavon Paganini, MD   Chief Complaint  Patient presents with  . Eye Problem   Subjective:    I, Tiburcio Pea, CMA, am acting as a scribe for Lavon Paganini, MD.    Eye Problem  The left eye is affected. This is a new problem. Episode onset: Thursday. The problem occurs constantly. The problem has been unchanged. The injury mechanism is unknown. The pain is severe. Associated symptoms include blurred vision, eye redness, a foreign body sensation and photophobia. Treatments tried: saline wash. The treatment provided no relief.   Patient also recently hospitalized on 8/26 for HTN and chest pain after cardiology office was concerned for unstable angina.  Chlorthalidone and Losartan were stopped during hospitalization.  She is currently only taking Amlodipine 10mg  daily.  She denies CP, SOB.  Home BP readings in 200s/100s  Allergies  Allergen Reactions  . Phenergan [Promethazine Hcl] Swelling     Current Outpatient Medications:  .  albuterol (PROVENTIL HFA;VENTOLIN HFA) 108 (90 Base) MCG/ACT inhaler, Inhale 2 puffs into the lungs every 6 (six) hours as needed for wheezing or shortness of breath., Disp: 1 Inhaler, Rfl: 2 .  amLODipine (NORVASC) 10 MG tablet, Take 1 tablet (10 mg total) by mouth daily., Disp: 90 tablet, Rfl: 3 .  pantoprazole (PROTONIX) 40 MG tablet, Take 1 tablet (40 mg total) by mouth daily., Disp: 30 tablet, Rfl: 3 .  traZODone (DESYREL) 50 MG tablet, Take 0.5-1 tablets (25-50 mg total) by mouth at bedtime as needed for sleep., Disp: 30 tablet, Rfl: 3 .  chlorthalidone (HYGROTON) 50 MG tablet, Take 1 tablet (50 mg total) by mouth daily., Disp: 30 tablet, Rfl: 2  Review of Systems  Constitutional: Negative.   Eyes: Positive for blurred vision, photophobia, pain, redness and visual disturbance.  Respiratory: Negative.   Musculoskeletal:  Negative.     Social History   Tobacco Use  . Smoking status: Current Every Day Smoker    Packs/day: 1.00    Years: 22.00    Pack years: 22.00    Types: Cigarettes  . Smokeless tobacco: Never Used  Substance Use Topics  . Alcohol use: Yes    Alcohol/week: 5.0 - 7.0 standard drinks    Types: 5 - 7 Cans of beer per week    Comment: now is almost every day/occasional      Objective:   BP (!) 200/100 (BP Location: Right Arm, Patient Position: Sitting, Cuff Size: Normal)   Pulse (!) 107   Temp (!) 97.3 F (36.3 C) (Oral)   SpO2 98%  Vitals:   10/31/18 1043  BP: (!) 200/100  Pulse: (!) 107  Temp: (!) 97.3 F (36.3 C)  TempSrc: Oral  SpO2: 98%  There is no height or weight on file to calculate BMI.   Physical Exam Vitals signs reviewed.  Constitutional:      General: She is not in acute distress.    Appearance: She is well-developed.  HENT:     Head: Normocephalic and atraumatic.  Eyes:     General: Lids are normal. Lids are everted, no foreign bodies appreciated. Vision grossly intact. Gaze aligned appropriately. No scleral icterus.       Right eye: No discharge.        Left eye: No discharge.     Extraocular Movements: Extraocular movements intact.  Conjunctiva/sclera:     Right eye: Right conjunctiva is not injected.     Left eye: Left conjunctiva is injected.     Pupils: Pupils are equal, round, and reactive to light.     Comments: Unable to appreciate foreign body in L eye.  Cardiovascular:     Rate and Rhythm: Normal rate and regular rhythm.     Heart sounds: Normal heart sounds. No murmur.  Pulmonary:     Effort: Pulmonary effort is normal. No respiratory distress.     Breath sounds: Normal breath sounds. No wheezing or rhonchi.  Musculoskeletal:     Right lower leg: No edema.     Left lower leg: No edema.  Skin:    General: Skin is warm and dry.     Capillary Refill: Capillary refill takes less than 2 seconds.     Findings: No rash.   Neurological:     Mental Status: She is alert and oriented to person, place, and time. Mental status is at baseline.  Psychiatric:        Mood and Affect: Mood normal.        Behavior: Behavior normal.     No results found for any visits on 10/31/18.     Assessment & Plan   Problem List Items Addressed This Visit      Cardiovascular and Mediastinum   Essential hypertension    Uncontrolled, but no signs of end organ damage and recent cardiac workup in hospital Continue amlodipine Resume chlorthalidone 50mg  daily Will likely need to add back losartan as well, but would like to do so slowly, so as to not hypo-perfuse her F/u in 1 wk      Relevant Medications   chlorthalidone (HYGROTON) 50 MG tablet    Other Visit Diagnoses    Sensation of foreign body in eye    -  Primary   Relevant Orders   Ambulatory referral to Ophthalmology   Redness of eye, left       Relevant Orders   Ambulatory referral to Ophthalmology    - new problem x4 days - copious amounts of saline used to flush L eye in office, but pain and foreign body sensation persists - no fluorescein availble, but concerned for possible corneal abrasion - referral to Optho - [facility] who will [facility] her in today    Return in about 1 week (around 11/07/2018) for BP and hospital f/u.   The entirety of the information documented in the History of Present Illness, Review of Systems and Physical Exam were personally obtained by me. Portions of this information were initially documented by Presley RaddleNikki Walston, CMA and reviewed by me for thoroughness and accuracy.    Bacigalupo, Marzella SchleinAngela M, MD MPH [facility]

## 2018-10-31 NOTE — Telephone Encounter (Signed)
NA

## 2018-10-31 NOTE — Assessment & Plan Note (Signed)
Uncontrolled, but no signs of end organ damage and recent cardiac workup in hospital Continue amlodipine Resume chlorthalidone 50mg  daily Will likely need to add back losartan as well, but would like to do so slowly, so as to not hypo-perfuse her F/u in 1 wk

## 2018-10-31 NOTE — Patient Instructions (Addendum)
[facility] will call you today to get you worked in today to be seen  Continue amlodipine 10mg  daily Restart Chlorthalidone 50mg  daily Continue to hold Losartan   Decrease sodium intake

## 2018-11-01 NOTE — Telephone Encounter (Signed)
Patient advised as directed below. 

## 2018-11-02 NOTE — Telephone Encounter (Signed)
Patient advised as below.  

## 2018-11-11 ENCOUNTER — Inpatient Hospital Stay: Payer: BLUE CROSS/BLUE SHIELD | Admitting: Family Medicine

## 2018-11-21 ENCOUNTER — Telehealth: Payer: Self-pay | Admitting: Family Medicine

## 2018-11-21 ENCOUNTER — Other Ambulatory Visit: Payer: Self-pay | Admitting: Family Medicine

## 2018-11-21 DIAGNOSIS — M19041 Primary osteoarthritis, right hand: Secondary | ICD-10-CM

## 2018-11-21 DIAGNOSIS — M19042 Primary osteoarthritis, left hand: Secondary | ICD-10-CM

## 2018-11-21 NOTE — Telephone Encounter (Signed)
Sent order for rheumatology evaluation of arthritis in both hands. Should try OTC Voltaren Gel massaged into hands as needed while trying to get appointment scheduled.

## 2018-11-21 NOTE — Telephone Encounter (Signed)
Pt called asking for a referral for her arthritis pain in her hands.  [facility]  [phone]  [place]

## 2018-11-21 NOTE — Telephone Encounter (Signed)
Please review

## 2018-12-07 DIAGNOSIS — M159 Polyosteoarthritis, unspecified: Secondary | ICD-10-CM | POA: Insufficient documentation

## 2018-12-07 DIAGNOSIS — M25541 Pain in joints of right hand: Secondary | ICD-10-CM | POA: Insufficient documentation

## 2018-12-07 DIAGNOSIS — M65331 Trigger finger, right middle finger: Secondary | ICD-10-CM | POA: Diagnosis not present

## 2018-12-07 DIAGNOSIS — M79641 Pain in right hand: Secondary | ICD-10-CM | POA: Diagnosis not present

## 2018-12-07 DIAGNOSIS — M79642 Pain in left hand: Secondary | ICD-10-CM | POA: Insufficient documentation

## 2018-12-07 DIAGNOSIS — G8929 Other chronic pain: Secondary | ICD-10-CM | POA: Diagnosis not present

## 2018-12-07 DIAGNOSIS — M79645 Pain in left finger(s): Secondary | ICD-10-CM | POA: Diagnosis not present

## 2018-12-22 ENCOUNTER — Ambulatory Visit: Payer: Self-pay | Admitting: Psychiatry

## 2019-04-17 ENCOUNTER — Telehealth: Payer: Self-pay | Admitting: Family Medicine

## 2019-04-17 NOTE — Telephone Encounter (Signed)
Patient is calling is request a referral to be placed for a ENT. Patient states that she is having several issues that she has discussed with Maurine Minister already. Please advise [phone]

## 2019-04-18 ENCOUNTER — Ambulatory Visit (INDEPENDENT_AMBULATORY_CARE_PROVIDER_SITE_OTHER): Payer: BLUE CROSS/BLUE SHIELD | Admitting: Family Medicine

## 2019-04-18 ENCOUNTER — Encounter: Payer: Self-pay | Admitting: Family Medicine

## 2019-04-18 DIAGNOSIS — Z720 Tobacco use: Secondary | ICD-10-CM

## 2019-04-18 DIAGNOSIS — J40 Bronchitis, not specified as acute or chronic: Secondary | ICD-10-CM

## 2019-04-18 DIAGNOSIS — F1010 Alcohol abuse, uncomplicated: Secondary | ICD-10-CM | POA: Diagnosis not present

## 2019-04-18 MED ORDER — AMOXICILLIN 875 MG PO TABS: 875.0000 mg | ORAL_TABLET | Freq: Two times a day (BID) | ORAL | 0 refills | Status: DC

## 2019-04-18 MED ORDER — PREDNISONE 10 MG PO TABS: ORAL_TABLET | ORAL | 0 refills | Status: DC

## 2019-04-18 NOTE — Telephone Encounter (Signed)
Recommend we schedule a visit to assess and document any symptoms since I haven't seen her since August.

## 2019-04-18 NOTE — Progress Notes (Signed)
Tamara Hoffman  MRN: [id number] DOB: 1965-05-23  Subjective:  HPI   Patient is a 54 year old female who presents via phone visit.  She complains of cough, congestion and shortness of breath.  She has had the symptoms for about 3 days.  She does relate that she has been using a propane heater.   It is of note that she is scheduled for Covid testing tomorrow as her employer is requiring her to do so.  Virtual Visit via Telephone Note  I connected with Tamara Hoffman on 04/18/19 at  3:00 PM EST by telephone and verified that I am speaking with the correct person using two identifiers.  Location: Patient: home Provider: office   I discussed the limitations, risks, security and privacy concerns of performing an evaluation and management service by telephone and the availability of in person appointments. I also discussed with the patient that there may be a patient responsible charge related to this service. The patient expressed understanding and agreed to proceed.  Patient Active Problem List   Diagnosis Date Noted  . Unstable angina (HCC) 10/26/2018  . Essential hypertension 10/26/2018  . Enlarged thoracic aorta (HCC) 10/26/2018  . Polysubstance abuse (HCC) 10/26/2018  . Chest pain 10/26/2018  . GI bleed 02/17/2017    Past Medical History:  Diagnosis Date  . GERD (gastroesophageal reflux disease)   . Hypertension    Past Surgical History:  Procedure Laterality Date  . LEEP     Family History  Problem Relation Age of Onset  . Diabetes Mother   . Heart Problems Mother   . Heart failure Mother   . Hypertension Father   . Heart Problems Father   . Coronary artery disease Father   . Diabetes Sister   . Heart murmur Sister   . Cancer Maternal Grandmother   . Heart murmur Sister    Social History   Socioeconomic History  . Marital status: Married    Spouse name: Not on file  . Number of children: Not on file  . Years of education: Not on file  . Highest education  level: Not on file  Occupational History  . Not on file  Tobacco Use  . Smoking status: Current Every Day Smoker    Packs/day: 1.00    Years: 22.00    Pack years: 22.00    Types: Cigarettes  . Smokeless tobacco: Never Used  Substance and Sexual Activity  . Alcohol use: Yes    Alcohol/week: 5.0 - 7.0 standard drinks    Types: 5 - 7 Cans of beer per week    Comment: now is almost every day/occasional  . Drug use: Not Currently    Types: Marijuana    Comment: Last used 1 week ago.  Marland Kitchen Sexual activity: Not on file  Other Topics Concern  . Not on file  Social History Narrative   Independent at baseline. Son lives with her.   Social Determinants of Health   Financial Resource Strain:   . Difficulty of Paying Living Expenses: Not on file  Food Insecurity:   . Worried About Programme researcher, broadcasting/film/video in the Last Year: Not on file  . Ran Out of Food in the Last Year: Not on file  Transportation Needs:   . Lack of Transportation (Medical): Not on file  . Lack of Transportation (Non-Medical): Not on file  Physical Activity:   . Days of Exercise per Week: Not on file  . Minutes of Exercise per Session:  Not on file  Stress:   . Feeling of Stress : Not on file  Social Connections:   . Frequency of Communication with Friends and Family: Not on file  . Frequency of Social Gatherings with Friends and Family: Not on file  . Attends Religious Services: Not on file  . Active Member of Clubs or Organizations: Not on file  . Attends Archivist Meetings: Not on file  . Marital Status: Not on file  Intimate Partner Violence:   . Fear of Current or Ex-Partner: Not on file  . Emotionally Abused: Not on file  . Physically Abused: Not on file  . Sexually Abused: Not on file    Outpatient Encounter Medications as of 04/18/2019  Medication Sig  . albuterol (PROVENTIL HFA;VENTOLIN HFA) 108 (90 Base) MCG/ACT inhaler Inhale 2 puffs into the lungs every 6 (six) hours as needed for wheezing or  shortness of breath.  Marland Kitchen amLODipine (NORVASC) 10 MG tablet Take 1 tablet (10 mg total) by mouth daily.  . chlorthalidone (HYGROTON) 50 MG tablet Take 1 tablet (50 mg total) by mouth daily.  . pantoprazole (PROTONIX) 40 MG tablet Take 1 tablet (40 mg total) by mouth daily.  . traZODone (DESYREL) 50 MG tablet Take 0.5-1 tablets (25-50 mg total) by mouth at bedtime as needed for sleep.   No facility-administered encounter medications on file as of 04/18/2019.    Allergies  Allergen Reactions  . Phenergan [Promethazine Hcl] Swelling    Review of Systems  Constitutional: Positive for malaise/fatigue. Negative for chills, diaphoresis and fever.  HENT: Positive for congestion, ear pain, sinus pain and sore throat (with white spots).   Respiratory: Positive for cough (non productive), shortness of breath and wheezing. Negative for sputum production.   Cardiovascular: Negative for chest pain.    Objective:  There were no vitals taken for this visit. Physical Exam: Congested cough during telephonic interview with slight wheeze.  Assessment and Plan :   1. Bronchitis Has some head fullness with stopped up sensation in ears the past 3 days. Denies any fever but having cough and wheeze now. Denies fever or significant fatigue/dyspnea. Feels she has PND and sees some white spots on tonsils. States she has had a loss of sense of smell the past 2-3 years. No known COVID exposure but plans to be tested tomorrow. Will treat bronchitis with antibiotic, Prednisone taper and may use Albuterol prn. Continue Alka Seltzer Daytime for head congestion. Still smoking 1-2 ppd. Advised to remain at home until COVID results reported.  - predniSONE (DELTASONE) 10 MG tablet; Taper dose by 1 tablet daily (by mouth) divided among meals and bedtime (6 day 1, 5 day 2, 4 day 3, 3 day 4, 2 day 5 and 1 day 6).  Dispense: 21 tablet; Refill: 0 - amoxicillin (AMOXIL) 875 MG tablet; Take 1 tablet (875 mg total) by mouth 2 (two)  times daily.  Dispense: 20 tablet; Refill: 0  2. Alcohol abuse Stopped all ETOH intake Thanksgiving 2020 and don't want anymore. Recheck prn.  3. Tobacco abuse Still smoking 1-2 ppd. A little less due to cough and wheeze the past 3 days. No hemoptysis. May use Ventolin-HFA prn. Encouraged to work on cessation program.    I discussed the assessment and treatment plan with the patient. The patient was provided an opportunity to ask questions and all were answered. The patient agreed with the plan and demonstrated an understanding of the instructions.   The patient was advised to call back  or seek an in-person evaluation if the symptoms worsen or if the condition fails to improve as anticipated.  I provided 17 minutes of non-face-to-face time during this encounter.

## 2019-04-19 ENCOUNTER — Ambulatory Visit: Payer: BLUE CROSS/BLUE SHIELD | Attending: Internal Medicine

## 2019-04-19 DIAGNOSIS — Z20822 Contact with and (suspected) exposure to covid-19: Secondary | ICD-10-CM

## 2019-04-21 LAB — NOVEL CORONAVIRUS, NAA: SARS-CoV-2, NAA: NOT DETECTED

## 2019-04-23 DIAGNOSIS — F17210 Nicotine dependence, cigarettes, uncomplicated: Secondary | ICD-10-CM | POA: Diagnosis not present

## 2019-04-23 DIAGNOSIS — R0602 Shortness of breath: Secondary | ICD-10-CM | POA: Diagnosis not present

## 2019-04-23 DIAGNOSIS — R0600 Dyspnea, unspecified: Secondary | ICD-10-CM | POA: Diagnosis not present

## 2019-04-23 DIAGNOSIS — I10 Essential (primary) hypertension: Secondary | ICD-10-CM | POA: Diagnosis not present

## 2019-04-23 DIAGNOSIS — Z20822 Contact with and (suspected) exposure to covid-19: Secondary | ICD-10-CM | POA: Diagnosis not present

## 2019-04-23 DIAGNOSIS — R062 Wheezing: Secondary | ICD-10-CM | POA: Diagnosis not present

## 2019-04-23 DIAGNOSIS — J441 Chronic obstructive pulmonary disease with (acute) exacerbation: Secondary | ICD-10-CM | POA: Diagnosis not present

## 2019-04-23 DIAGNOSIS — B9789 Other viral agents as the cause of diseases classified elsewhere: Secondary | ICD-10-CM | POA: Diagnosis not present

## 2019-04-23 DIAGNOSIS — J069 Acute upper respiratory infection, unspecified: Secondary | ICD-10-CM | POA: Diagnosis not present

## 2019-04-23 DIAGNOSIS — F17200 Nicotine dependence, unspecified, uncomplicated: Secondary | ICD-10-CM | POA: Insufficient documentation

## 2019-04-23 DIAGNOSIS — R05 Cough: Secondary | ICD-10-CM | POA: Diagnosis not present

## 2019-04-23 DIAGNOSIS — R079 Chest pain, unspecified: Secondary | ICD-10-CM | POA: Diagnosis not present

## 2019-04-24 DIAGNOSIS — F17210 Nicotine dependence, cigarettes, uncomplicated: Secondary | ICD-10-CM | POA: Diagnosis not present

## 2019-04-24 DIAGNOSIS — J069 Acute upper respiratory infection, unspecified: Secondary | ICD-10-CM | POA: Diagnosis not present

## 2019-04-24 DIAGNOSIS — R05 Cough: Secondary | ICD-10-CM | POA: Diagnosis not present

## 2019-04-24 DIAGNOSIS — R062 Wheezing: Secondary | ICD-10-CM | POA: Diagnosis not present

## 2019-04-24 DIAGNOSIS — Z6833 Body mass index (BMI) 33.0-33.9, adult: Secondary | ICD-10-CM | POA: Diagnosis not present

## 2019-05-16 ENCOUNTER — Other Ambulatory Visit: Payer: Self-pay

## 2019-05-16 ENCOUNTER — Other Ambulatory Visit (HOSPITAL_COMMUNITY)
Admission: RE | Admit: 2019-05-16 | Discharge: 2019-05-16 | Disposition: A | Discharge status: Home / Self Care | Payer: BLUE CROSS/BLUE SHIELD | Source: Ambulatory Visit | Attending: Adult Health | Admitting: Adult Health

## 2019-05-16 ENCOUNTER — Ambulatory Visit (INDEPENDENT_AMBULATORY_CARE_PROVIDER_SITE_OTHER): Payer: BLUE CROSS/BLUE SHIELD | Admitting: Adult Health

## 2019-05-16 ENCOUNTER — Encounter: Payer: Self-pay | Admitting: Adult Health

## 2019-05-16 ENCOUNTER — Ambulatory Visit: Payer: Self-pay | Admitting: *Deleted

## 2019-05-16 DIAGNOSIS — Z113 Encounter for screening for infections with a predominantly sexual mode of transmission: Secondary | ICD-10-CM | POA: Diagnosis not present

## 2019-05-16 DIAGNOSIS — N3091 Cystitis, unspecified with hematuria: Secondary | ICD-10-CM | POA: Insufficient documentation

## 2019-05-16 DIAGNOSIS — A630 Anogenital (venereal) warts: Secondary | ICD-10-CM

## 2019-05-16 DIAGNOSIS — F321 Major depressive disorder, single episode, moderate: Secondary | ICD-10-CM

## 2019-05-16 DIAGNOSIS — R300 Dysuria: Secondary | ICD-10-CM | POA: Insufficient documentation

## 2019-05-16 DIAGNOSIS — Z124 Encounter for screening for malignant neoplasm of cervix: Secondary | ICD-10-CM | POA: Insufficient documentation

## 2019-05-16 DIAGNOSIS — M545 Low back pain, unspecified: Secondary | ICD-10-CM | POA: Insufficient documentation

## 2019-05-16 DIAGNOSIS — J441 Chronic obstructive pulmonary disease with (acute) exacerbation: Secondary | ICD-10-CM

## 2019-05-16 DIAGNOSIS — F17209 Nicotine dependence, unspecified, with unspecified nicotine-induced disorders: Secondary | ICD-10-CM

## 2019-05-16 DIAGNOSIS — I7789 Other specified disorders of arteries and arterioles: Secondary | ICD-10-CM

## 2019-05-16 LAB — POCT URINALYSIS DIPSTICK
Bilirubin, UA: NEGATIVE
Glucose, UA: NEGATIVE
Nitrite, UA: NEGATIVE
Protein, UA: POSITIVE — AB
Spec Grav, UA: 1.01 (ref 1.010–1.025)
Urobilinogen, UA: 1 E.U./dL
pH, UA: 6 (ref 5.0–8.0)

## 2019-05-16 MED ORDER — DOXYCYCLINE HYCLATE 100 MG PO TABS: 100.0000 mg | ORAL_TABLET | Freq: Two times a day (BID) | ORAL | 0 refills | Status: DC

## 2019-05-16 NOTE — Progress Notes (Signed)
Prescribed Doxycycline. Will wait for culture and call if need.

## 2019-05-16 NOTE — Telephone Encounter (Signed)
Patient is calling to report she has been having increased urinary symptoms for 1 week- and she has started seeing blood in her urine- appointment schedule. Patient states she needs pap smear and to be checked for HPV on vulva- told patient she may need another appointment for that depending on time restraints.  Reason for Disposition . Urinating more frequently than usual (i.e., frequency)  Answer Assessment - Initial Assessment Questions 1. SYMPTOM: "What's the main symptom you're concerned about?" (e.g., frequency, incontinence)     Urinary pressure- urgency, blood in urine 2. ONSET: "When did the  Urinary symptoms  start?"     1 week ago 3. PAIN: "Is there any pain?" If so, ask: "How bad is it?" (Scale: 1-10; mild, moderate, severe)     At end of stream 4. CAUSE: "What do you think is causing the symptoms?"    Possible UTI 5. OTHER SYMPTOMS: "Do you have any other symptoms?" (e.g., fever, flank pain, blood in urine, pain with urination)     Blood in blood, pain with urination, lower back ache 6. PREGNANCY: "Is there any chance you are pregnant?" "When was your last menstrual period?"     n/a  Protocols used: URINARY [facility]

## 2019-05-16 NOTE — Patient Instructions (Addendum)
Protected sex recommended.   Urinary Tract Infection, Adult A urinary tract infection (UTI) is an infection of any part of the urinary tract. The urinary tract includes:  The kidneys.  The ureters.  The bladder.  The urethra. These organs make, store, and get rid of pee (urine) in the body. What are the causes? This is caused by germs (bacteria) in your genital area. These germs grow and cause swelling (inflammation) of your urinary tract. What increases the risk? You are more likely to develop this condition if:  You have a small, thin tube (catheter) to drain pee.  You cannot control when you pee or poop (incontinence).  You are female, and: ? You use these methods to prevent pregnancy:  A medicine that kills sperm (spermicide).  A device that blocks sperm (diaphragm). ? You have low levels of a female hormone (estrogen). ? You are pregnant.  You have genes that add to your risk.  You are sexually active.  You take antibiotic medicines.  You have trouble peeing because of: ? A prostate that is bigger than normal, if you are female. ? A blockage in the part of your body that drains pee from the bladder (urethra). ? A kidney stone. ? A nerve condition that affects your bladder (neurogenic bladder). ? Not getting enough to drink. ? Not peeing often enough.  You have other conditions, such as: ? Diabetes. ? A weak disease-fighting system (immune system). ? Sickle cell disease. ? Gout. ? Injury of the spine. What are the signs or symptoms? Symptoms of this condition include:  Needing to pee right away (urgently).  Peeing often.  Peeing small amounts often.  Pain or burning when peeing.  Blood in the pee.  Pee that smells bad or not like normal.  Trouble peeing.  Pee that is cloudy.  Fluid coming from the vagina, if you are female.  Pain in the belly or lower back. Other symptoms include:  Throwing up (vomiting).  No urge to eat.  Feeling  mixed up (confused).  Being tired and grouchy (irritable).  A fever.  Watery poop (diarrhea). How is this treated? This condition may be treated with:  Antibiotic medicine.  Other medicines.  Drinking enough water. Follow these instructions at home:  Medicines  Take over-the-counter and prescription medicines only as told by your doctor.  If you were prescribed an antibiotic medicine, take it as told by your doctor. Do not stop taking it even if you start to feel better. General instructions  Make sure you: ? Pee until your bladder is empty. ? Do not hold pee for a long time. ? Empty your bladder after sex. ? Wipe from front to back after pooping if you are a female. Use each tissue one time when you wipe.  Drink enough fluid to keep your pee pale yellow.  Keep all follow-up visits as told by your doctor. This is important. Contact a doctor if:  You do not get better after 1-2 days.  Your symptoms go away and then come back. Get help right away if:  You have very bad back pain.  You have very bad pain in your lower belly.  You have a fever.  You are sick to your stomach (nauseous).  You are throwing up. Summary  A urinary tract infection (UTI) is an infection of any part of the urinary tract.  This condition is caused by germs in your genital area.  There are many risk factors for a UTI. These  include having a small, thin tube to drain pee and not being able to control when you pee or poop.  Treatment includes antibiotic medicines for germs.  Drink enough fluid to keep your pee pale yellow. This information is not intended to replace advice given to you by your health care provider. Make sure you discuss any questions you have with your health care provider. Document Revised: 02/03/2018 Document Reviewed: 08/26/2017 Elsevier Patient Education  [place]. Doxycycline tablets or capsules What is this medicine? DOXYCYCLINE (dox i SYE kleen) is a  tetracycline antibiotic. It kills certain bacteria or stops their growth. It is used to treat many kinds of infections, like dental, skin, respiratory, and urinary tract infections. It also treats acne, Lyme disease, malaria, and certain sexually transmitted infections. This medicine may be used for other purposes; ask your health care provider or pharmacist if you have questions. COMMON BRAND NAME(S): Acticlate, Adoxa, Adoxa CK, Adoxa Pak, Adoxa TT, Alodox, Avidoxy, Doxal, LYMEPAK, Mondoxyne NL, Monodox, Morgidox 1x, Morgidox 1x Kit, Morgidox 2x, Morgidox 2x Kit, NutriDox, Ocudox, [place], [place], Vibra-Tabs, Vibramycin What should I tell my health care provider before I take this medicine? They need to know if you have any of these conditions:  liver disease  long exposure to sunlight like working outdoors  stomach problems like colitis  an unusual or allergic reaction to doxycycline, tetracycline antibiotics, other medicines, foods, dyes, or preservatives  pregnant or trying to get pregnant  breast-feeding How should I use this medicine? Take this medicine by mouth with a full glass of water. Follow the directions on the prescription label. It is best to take this medicine without food, but if it upsets your stomach take it with food. Take your medicine at regular intervals. Do not take your medicine more often than directed. Take all of your medicine as directed even if you think you are better. Do not skip doses or stop your medicine early. Talk to your pediatrician regarding the use of this medicine in children. While this drug may be prescribed for selected conditions, precautions do apply. Overdosage: If you think you have taken too much of this medicine contact a poison control center or emergency room at once. NOTE: This medicine is only for you. Do not share this medicine with others. What if I miss a dose? If you miss a dose, take it as soon as you can. If it is almost time for  your next dose, take only that dose. Do not take double or extra doses. What may interact with this medicine?  antacids  barbiturates  birth control pills  bismuth subsalicylate  carbamazepine  methoxyflurane  other antibiotics  phenytoin  vitamins that contain iron  warfarin This list may not describe all possible interactions. Give your health care provider a list of all the medicines, herbs, non-prescription drugs, or dietary supplements you use. Also tell them if you smoke, drink alcohol, or use illegal drugs. Some items may interact with your medicine. What should I watch for while using this medicine? Tell your doctor or health care professional if your symptoms do not improve. Do not treat diarrhea with over the counter products. Contact your doctor if you have diarrhea that lasts more than 2 days or if it is severe and watery. Do not take this medicine just before going to bed. It may not dissolve properly when you lay down and can cause pain in your throat. Drink plenty of fluids while taking this medicine to also help reduce  irritation in your throat. This medicine can make you more sensitive to the sun. Keep out of the sun. If you cannot avoid being in the sun, wear protective clothing and use sunscreen. Do not use sun lamps or tanning beds/booths. Birth control pills may not work properly while you are taking this medicine. Talk to your doctor about using an extra method of birth control. If you are being treated for a sexually transmitted infection, avoid sexual contact until you have finished your treatment. Your sexual partner may also need treatment. Avoid antacids, aluminum, calcium, magnesium, and iron products for 4 hours before and 2 hours after taking a dose of this medicine. If you are using this medicine to prevent malaria, you should still protect yourself from contact with mosquitos. Stay in screened-in areas, use mosquito nets, keep your body covered, and use  an insect repellent. What side effects may I notice from receiving this medicine? Side effects that you should report to your doctor or health care professional as soon as possible:  allergic reactions like skin rash, itching or hives, swelling of the face, lips, or tongue  difficulty breathing  fever  itching in the rectal or genital area  pain on swallowing  rash, fever, and swollen lymph nodes  redness, blistering, peeling or loosening of the skin, including inside the mouth  severe stomach pain or cramps  unusual bleeding or bruising  unusually weak or tired  yellowing of the eyes or skin Side effects that usually do not require medical attention (report to your doctor or health care professional if they continue or are bothersome):  diarrhea  loss of appetite  nausea, vomiting This list may not describe all possible side effects. Call your doctor for medical advice about side effects. You may report side effects to FDA at 1-800-FDA-1088. Where should I keep my medicine? Keep out of the reach of children. Store at room temperature, below 30 degrees C (86 degrees F). Protect from light. Keep container tightly closed. Throw away any unused medicine after the expiration date. Taking this medicine after the expiration date can make you seriously ill. NOTE: This sheet is a summary. It may not cover all possible information. If you have questions about this medicine, talk to your doctor, pharmacist, or health care provider.  2020 Elsevier/Gold Standard (2018-05-19 13:44:53)   Genital Warts Genital warts are a common STD (sexually transmitted disease). They may appear as small bumps on the skin of the genital and anal areas. They sometimes become irritated and cause pain. Genital warts are easily passed to other people through sexual contact. Many people do not know that they are infected. They may be infected for years without symptoms. However, even if they do not have  symptoms, they can pass the infection to their sexual partners. Getting treatment is important because genital warts can lead to other problems. In females, the virus that causes genital warts may increase the risk for cervical cancer. What are the causes? This condition is caused by a virus that is called human papillomavirus (HPV). HPV is spread by having unprotected sex with an infected person. It can be spread through vaginal, anal, and oral sex. What increases the risk? You are more likely to develop this condition if:  You have unprotected sex.  You have multiple sexual partners.  You are sexually active before age 36.  You are a man who isnot circumcised.  You have a female sexual partner who is not circumcised.  You have a weakened  body defense system (immune system) due to disease or medicine.  You smoke. What are the signs or symptoms? Symptoms of this condition include:  Small growths in the genital area or anal area. These warts often grow in clusters.  Itching and irritation in the genital area or anal area.  Bleeding from the warts.  Pain during sex. How is this diagnosed? This condition is diagnosed based on:  Your symptoms.  A physical exam. You may also have other tests, including:  Biopsy. A tissue sample is removed so it can be checked under a microscope.  Colposcopy. In females, a magnifying tool is used to examine the vagina and cervix. Certain solutions may be used to make the HPV cells change color so they can be seen more easily.  A Pap test in females.  Tests for other STDs. How is this treated? This condition may be treated with:  Medicines, such as: ? Solutions or creams applied to your skin (topical).  Procedures, such as: ? Freezing the warts with liquid nitrogen (cryotherapy). ? Burning the warts with a laser or electric probe (electrocautery). ? Surgery to remove the warts. Follow these instructions at home: Medicines   Apply  over-the-counter and prescription medicines only as told by your health care provider.  Do not treat genital warts with medicines that are used for treating hand warts.  Talk with your health care provider about using over-the-counter anti-itch creams. Instructions for women  Get screened regularly for cervical cancer. Women who have genital warts are at an increased risk for this cancer. This type of cancer is slow growing and can be cured if it is found early.  If you become pregnant, tell your health care provider that you have had an HPV infection. Your health care provider will monitor you closely during pregnancy to be sure that your baby is safe. General instructions  Do not touch or scratch the warts.  Do not have sex until your treatment has been completed.  Tell your current and past sexual partners about your condition because they may also need treatment.  After treatment, use condoms during sex to prevent future infections.  Keep all follow-up visits as told by your health care provider. This is important. How is this prevented? Talk with your health care provider about getting the HPV vaccine. The vaccine:  Can prevent some HPV infections and cancers.  Is recommended for males and females who are 28-67 years old.  Is not recommended for pregnant women.  Will not work if you already have HPV. Contact a health care provider if you:  Have redness, swelling, or pain in the area of the treated skin.  Have a fever.  Feel generally ill.  Feel lumps in and around your genital or anal area.  Have bleeding in your genital or anal area.  Have pain during sex. Summary  Genital warts are a common STD (sexually transmitted disease). It may appear as small bumps on the genital and anal areas.  This condition is caused by a virus that is called human papillomavirus (HPV). HPV is spread by having unprotected sex with an infected person. It can be spread through vaginal,  anal, and oral sex.  Treatment is important because genital warts can lead to other problems. In females, the virus that causes genital warts may increase the risk for cervical cancer.  This condition may be treated with medicine that is applied to the skin, or procedures to remove the warts.  The HPV vaccine can  prevent some HPV infections and cancers. It is recommended that the vaccine be given to males and females who are 38-78 years old. This information is not intended to replace advice given to you by your health care provider. Make sure you discuss any questions you have with your health care provider. Document Revised: 03/23/2017 Document Reviewed: 03/23/2017 Elsevier Patient Education  [place].  Human Papillomavirus Human papillomavirus (HPV) is the most common sexually transmitted infection (STI). It easily spreads from person to person (is very contagious). There are many types of HPV. HPV often does not cause symptoms. Sometimes it may cause genital warts that can be seen and felt. There may also be wart-like lesions in the throat. You can have HPV for a long time and not know it. You may spread HPV to others without knowing it. Certain types of HPV may cause cancer. What are the causes? HPV is caused by a virus that spreads from person to person through sex. What increases the risk? You may be more likely to get HPV if you have or have had:  Unprotected sex of any kind.  Several sex partners.  A sex partner who has other sex partners.  Another STI.  A weak disease-fighting system (immune system).  Damaged skin in the genital, oral, or anal area. What are the signs or symptoms? Most people who have HPV do not have any symptoms. If you have symptoms, they may include:  Wart-like lesions in the throat from having oral sex.  Warts on the infected areas.  Genital warts that may itch, burn, bleed, or be painful during sex. How is this treated? There is no  treatment for the virus itself. However, there are treatments for the health problems and symptoms HPV can cause. Your doctor may treat HPV by:  Giving medicines that are creams, lotions, liquids, or gels. These medicines may be injected into or put onto genital or anal warts.  Applying one of the following to the genital or anal warts: ? Extreme cold. ? An intense beam of light (laser treatment). ? Extreme heat.  Doing surgery to remove the genital or anal warts. Your doctor will monitor you closely after you are treated. HPV can come back and you may need treatment again. Follow these instructions at home: Medicines  Take over-the-counter and prescription medicines only as told by your doctor.  Do not treat genital warts with medicines that are meant for hand warts. General instructions  Do not touch or scratch the warts.  Do not have sex while you are being treated.  Do not douche or use tampons during treatment (for women).  Tell your sex partner about your infection. He or she may also need to be treated.  If you get pregnant, tell your doctor that you have HPV. Your doctor will monitor you during the pregnancy.  Keep all follow-up visits as told by your doctor. This is important. How is this prevented?  Talk with your doctor about getting an HPV vaccine. This can prevent some HPV infections and related cancers. You may need 2-3 doses of the vaccine, depending on your age. ? The vaccine will not work if you already have HPV. ? The vaccine is not recommended for pregnant women.  After treatment, use condoms during sex. This helps to prevent future infections.  Have only one sex partner.  Have a sex partner who does not have other sex partners.  Get Pap tests as told by your doctor. Contact a doctor if:  The treated skin gets red, swollen, or painful.  You have a fever.  You feel ill.  You feel lumps or pimples in and around your genital or anal area.  You have  bleeding from the vagina.  You have bleeding from the area that was treated.  You have pain during sex. Summary  HPV is the most common STI. It easily spreads from person to person.  HPV often does not cause any symptoms.  HPV vaccine can prevent some HPV infections and related cancers.  There is no treatment for the virus itself. However, there are treatments for the health problems and symptoms HPV can cause. This information is not intended to replace advice given to you by your health care provider. Make sure you discuss any questions you have with your health care provider. Document Revised: 06/09/2018 Document Reviewed: 10/14/2017 Elsevier Patient Education  [place].   HPV Test Why am I having this test? HPV (human papillomavirus) refers to a group of about 100 viruses. Many of these viruses cause growths on, in, or around the genitals. Most HPV viruses cause infections that usually go away without treatment.  The HPV test checks for high-risk types (strains) of HPV. Strains 16 and 18 are considered the most high-risk for cancer. If you have strain 16 or 18 HPV and it is not treated, it can increase your risk for cancer of the cervix, vagina, vulva, or anus. HPV can be found in both males and females. However, the HPV test is used to screen for increased cancer risk in females:  Who are 35-47 years old.  Who have an abnormal Pap test.  Who have been treated for an abnormal Pap test in the past.  Who have been treated for a high-risk HPV infection in the past. If you are a woman older than 30, you may have the HPV test at the same time as a pelvic exam and Pap test. What is being tested? This test checks for the DNA (genetic) strands of the HPV infection. This test is also called the HPV DNA test. What kind of sample is taken?  This test requires a sample of cells from the cervix. This will be done using a small cotton swab, plastic spatula, or brush. This sample  is often collected during a pelvic exam, when you are lying on your back on an exam table with feet in footrests (stirrups). How do I prepare for this test?  Starting 24-48 hours before your test, or as told by your health care provider, do not: ? Take a bath. ? Have sex. ? Douche.  Schedule the test for a day when you are not menstruating. If you are menstruating on the day of the test, you may need to reschedule.  You will be asked to urinate right before the test. How are the results reported? Your test results will be reported as either positive or negative for HPV. If you have a positive result, the results will indicate which HPV strain you are positive for. What do the results mean? A negative HPV test result means that no HPV was found. This means it is very likely that you do not have HPV. A positive HPV test result means that you have HPV.  If your results show the presence of any high-risk strains, you may have a higher risk of developing anal or cervical cancer if your infection is not treated.  If any low-risk HPV strains are found, it is not likely that  you have an increased risk for cancer. Talk with your health care provider about what your results mean. Questions to ask your health care provider Ask your health care provider, or the department that is doing the test:  When will my results be ready?  How will I get my results?  What are my treatment options?  What other tests do I need?  What are my next steps? Summary  The human papillomavirus (HPV) test is used to look for high-risk types of HPV infection. This test is done only for females.  HPV types 16 and 18 are considered high-risk types of HPV. If untreated, these types of infections increase your risk for cancer of the cervix or anus.  A negative HPV test result means that no HPV was found, and it is very likely that you do not have HPV.  A positive HPV test result means that you have an HPV  infection. This information is not intended to replace advice given to you by your health care provider. Make sure you discuss any questions you have with your health care provider. Document Revised: 01/29/2017 Document Reviewed: 12/29/2016 Elsevier Patient Education  [place].  HPV Test Why am I having this test? HPV (human papillomavirus) refers to a group of about 100 viruses. Many of these viruses cause growths on, in, or around the genitals. Most HPV viruses cause infections that usually go away without treatment.  The HPV test checks for high-risk types (strains) of HPV. Strains 16 and 18 are considered the most high-risk for cancer. If you have strain 16 or 18 HPV and it is not treated, it can increase your risk for cancer of the cervix, vagina, vulva, or anus. HPV can be found in both males and females. However, the HPV test is used to screen for increased cancer risk in females:  Who are 64-41 years old.  Who have an abnormal Pap test.  Who have been treated for an abnormal Pap test in the past.  Who have been treated for a high-risk HPV infection in the past. If you are a woman older than 30, you may have the HPV test at the same time as a pelvic exam and Pap test. What is being tested? This test checks for the DNA (genetic) strands of the HPV infection. This test is also called the HPV DNA test. What kind of sample is taken?  This test requires a sample of cells from the cervix. This will be done using a small cotton swab, plastic spatula, or brush. This sample is often collected during a pelvic exam, when you are lying on your back on an exam table with feet in footrests (stirrups). How do I prepare for this test?  Starting 24-48 hours before your test, or as told by your health care provider, do not: ? Take a bath. ? Have sex. ? Douche.  Schedule the test for a day when you are not menstruating. If you are menstruating on the day of the test, you may need to  reschedule.  You will be asked to urinate right before the test. How are the results reported? Your test results will be reported as either positive or negative for HPV. If you have a positive result, the results will indicate which HPV strain you are positive for. What do the results mean? A negative HPV test result means that no HPV was found. This means it is very likely that you do not have HPV. A positive HPV test  result means that you have HPV.  If your results show the presence of any high-risk strains, you may have a higher risk of developing anal or cervical cancer if your infection is not treated.  If any low-risk HPV strains are found, it is not likely that you have an increased risk for cancer. Talk with your health care provider about what your results mean. Questions to ask your health care provider Ask your health care provider, or the department that is doing the test:  When will my results be ready?  How will I get my results?  What are my treatment options?  What other tests do I need?  What are my next steps? Summary  The human papillomavirus (HPV) test is used to look for high-risk types of HPV infection. This test is done only for females.  HPV types 16 and 18 are considered high-risk types of HPV. If untreated, these types of infections increase your risk for cancer of the cervix or anus.  A negative HPV test result means that no HPV was found, and it is very likely that you do not have HPV.  A positive HPV test result means that you have an HPV infection. This information is not intended to replace advice given to you by your health care provider. Make sure you discuss any questions you have with your health care provider. Document Revised: 01/29/2017 Document Reviewed: 12/29/2016 Elsevier Patient Education  2020 [employer].

## 2019-05-16 NOTE — Progress Notes (Signed)
Patient: Tamara Hoffman Female    DOB: 01/31/1966   54 y.o.   MRN: [id number] Visit Date: 05/16/2019  Today's Provider: Marcille Buffy, FNP   Chief Complaint  Patient presents with  . Hematuria   Subjective:     Hematuria This is a new problem. The current episode started in the past 7 days. The problem has been gradually worsening since onset. Severity: pink on tissue. Started after intercourse with female partner about 7 days ago. occured two days after intercourse. Denies any trauma or abuse.  She reports no clotting in her urine stream. Her pain is at a severity of 0/10. The pain is mild. She describes her urine color as light pink. Irritative symptoms include frequency, nocturia and urgency. Obstructive symptoms include incomplete emptying. Obstructive symptoms do not include dribbling, an intermittent stream, a slower stream, straining or a weak stream. Associated symptoms include abdominal pain (pelvic pain generalized ), dysuria, facial swelling (since having prednisone at the hospital- reports she had this prior - mild ) and genital pain. Pertinent negatives include no chills, fever, flank pain, hesitancy, inability to urinate, nausea or vomiting. She is sexually active. Her past medical history is significant for hypertension, STDs and tobacco use. There is no history of kidney stones or recent infection.   She has been on antibiotics and prednisone multiple times 04/25/2019 COPD exacerbation. - she reports her face has been mildly swollen since taking all of the prednisone in the hospital. Tested negative for Covid. Denies any other edema.   She has not had any history of kidney stone in past.  She has pain in left lower back.    Urine has cleared up since yesterdays episodes, she has not had bleeding since.  She has no abnormal vaginal discharge.   She was treated by Cloyde Reams for Genital warts. She was given Condylox  and used it 1- 2 times and she could not  tolerate it. She now has some areas near rectum and on her vaginal area.  She reports they worsened while she was in hospital for cough/ COPD exacerbation at [facility]. She reports she was covid negative. Doing much better now. Still smoking.  She had cervical dysplasia and had a LEEP in her 30's. She has not had the follow up with gynecology as she was advised. She is overdue for PAP and requests one today.  She has urinary urgency.   Patient  denies any fever, body aches,chills, rash, chest pain, shortness of breath, nausea, vomiting, or diarrhea.  Review from [place], Rad Results In - 04/24/2019 10:27 AM EST EXAM: XR CHEST 2 VIEWS DATE: 04/24/2019 6:39 AM ACCESSION: [id number] UN DICTATED: 04/24/2019 9:08 AM INTERPRETATION LOCATION: [facility]  CLINICAL INDICATION: 54 years old Female with COUGH    COMPARISON: Prior day chest radiograph.  TECHNIQUE: PA and Lateral Chest Radiographs.  FINDINGS:   Radiographically clear lungs.  No pleural effusion or pneumothorax.  Unremarkable cardiomediastinal silhouette.   IMPRESSION:  Clear lungs.      Allergies  Allergen Reactions  . Promethazine Hcl Swelling     Current Outpatient Medications:  .  albuterol (PROVENTIL HFA;VENTOLIN HFA) 108 (90 Base) MCG/ACT inhaler, Inhale 2 puffs into the lungs every 6 (six) hours as needed for wheezing or shortness of breath., Disp: 1 Inhaler, Rfl: 2 .  diclofenac Sodium (VOLTAREN) 1 % GEL, Apply topically., Disp: , Rfl:  .  losartan (COZAAR) 50 MG tablet, Take by  mouth., Disp: , Rfl:  .  pantoprazole (PROTONIX) 40 MG tablet, Take 1 tablet (40 mg total) by mouth daily., Disp: 30 tablet, Rfl: 3 .  Spacer/Aero-Holding Chambers (OPTICHAMBER DIAMOND) MISC, by Does not apply route., Disp: , Rfl:  .  traZODone (DESYREL) 50 MG tablet, Take 0.5-1 tablets (25-50 mg total) by mouth at bedtime as needed for sleep., Disp: 30 tablet, Rfl: 3 .  amLODipine (NORVASC) 10 MG tablet,  Take 1 tablet (10 mg total) by mouth daily. (Patient not taking: Reported on 05/16/2019), Disp: 90 tablet, Rfl: 3  Review of Systems  Constitutional: Negative for activity change, appetite change, chills, diaphoresis, fatigue, fever and unexpected weight change.  HENT: Positive for facial swelling (since having prednisone at the hospital- reports she had this prior - mild ). Negative for congestion, dental problem, ear discharge, ear pain, postnasal drip and rhinorrhea.   Eyes: Negative for visual disturbance.  Respiratory: Negative.  Negative for apnea, cough, choking, chest tightness, shortness of breath, wheezing and stridor.   Cardiovascular: Negative.  Negative for chest pain, palpitations and leg swelling.  Gastrointestinal: Positive for abdominal pain (pelvic pain generalized ). Negative for abdominal distention, anal bleeding, blood in stool, constipation, diarrhea, nausea, rectal pain and vomiting.  Endocrine: Negative for cold intolerance, heat intolerance, polydipsia, polyphagia and polyuria.  Genitourinary: Positive for dysuria, frequency, hematuria, incomplete emptying, nocturia and urgency. Negative for decreased urine volume, difficulty urinating, dyspareunia, enuresis, flank pain, genital sores, hesitancy, menstrual problem, pelvic pain, vaginal bleeding, vaginal discharge and vaginal pain.  Musculoskeletal: Negative.   Skin: Negative for color change and rash.  Neurological: Negative.   Hematological: Negative.   Psychiatric/Behavioral: Negative.     Social History   Tobacco Use  . Smoking status: Current Every Day Smoker    Packs/day: 1.00    Years: 22.00    Pack years: 22.00    Types: Cigarettes  . Smokeless tobacco: Never Used  Substance Use Topics  . Alcohol use: Yes    Alcohol/week: 5.0 - 7.0 standard drinks    Types: 5 - 7 Cans of beer per week    Comment: now is almost every day/occasional      Objective:  Afebrile. All vital signs reported and reviewed and  within normal limits heart rate/ blood pressure/ Oxygen saturation and temperature.   Physical Exam Vitals reviewed. Exam conducted with a chaperone present.  Constitutional:      General: She is not in acute distress.    Appearance: Normal appearance. She is not ill-appearing, toxic-appearing or diaphoretic.  HENT:     Head: Normocephalic and atraumatic.     Right Ear: Tympanic membrane, ear canal and external ear normal. There is no impacted cerumen.     Left Ear: Tympanic membrane, ear canal and external ear normal. There is no impacted cerumen.     Nose: Nose normal. No congestion or rhinorrhea.     Mouth/Throat:     Mouth: Mucous membranes are moist.     Pharynx: No oropharyngeal exudate or posterior oropharyngeal erythema.  Eyes:     General: No scleral icterus.       Right eye: No discharge.        Left eye: No discharge.     Extraocular Movements: Extraocular movements intact.     Conjunctiva/sclera: Conjunctivae normal.     Pupils: Pupils are equal, round, and reactive to light.  Cardiovascular:     Rate and Rhythm: Normal rate and regular rhythm.     Pulses: Normal pulses.  Heart sounds: Normal heart sounds. No murmur. No friction rub. No gallop.   Pulmonary:     Effort: Pulmonary effort is normal. No respiratory distress.     Breath sounds: No stridor. Wheezing (occasional expiratory wheeze in bilateral  upper lobes.( she just smoked before coming in office) ) present. No rhonchi or rales.  Chest:     Chest wall: No tenderness.  Abdominal:     General: There is no distension.     Palpations: Abdomen is soft. There is no mass.     Tenderness: There is no abdominal tenderness. There is no right CVA tenderness, left CVA tenderness, guarding or rebound.     Hernia: No hernia is present. There is no hernia in the left inguinal area or right inguinal area.  Genitourinary:    Exam position: Lithotomy position.     Pubic Area: No rash or pubic lice.      Tanner stage  (genital): 5.     Labia:        Right: Rash, tenderness and lesion present. No injury.        Left: Rash, tenderness and lesion present. No injury.      Urethra: No prolapse, urethral pain, urethral swelling or urethral lesion.     Vagina: Normal.     Cervix: Normal.     Uterus: Normal.      Adnexa: Right adnexa normal and left adnexa normal.     Rectum: Normal.       Comments: multiple stalk like growths skin colored in varied sizes on labia majora/ minor and scattered through out  perineum/ rectum consistent with genital warts. She has one area that is slightly tender to touch and has scabbed over.  Musculoskeletal:        General: Normal range of motion.     Cervical back: Normal range of motion and neck supple.  Lymphadenopathy:     Lower Body: No right inguinal adenopathy. No left inguinal adenopathy.  Skin:    General: Skin is warm and dry.     Capillary Refill: Capillary refill takes less than 2 seconds.     Findings: Lesion present.  Neurological:     General: No focal deficit present.     Mental Status: She is alert and oriented to person, place, and time.     Motor: No weakness.     Coordination: Coordination normal.     Gait: Gait normal.  Psychiatric:        Mood and Affect: Mood normal.        Behavior: Behavior normal.        Thought Content: Thought content normal.        Judgment: Judgment normal.      No results found for any visits on 05/16/19.     Assessment & Plan    Dysuria - Plan: CBC with Differential/Platelet, Comprehensive Metabolic Panel (CMET), DG Abd 1 View, Cytology - PAP  Cystitis with hematuria - Plan: POCT urinalysis dipstick, Cytology - PAP, Urine Culture, Ambulatory referral to Gynecology, CANCELED: Urine Culture  Acute left-sided low back pain without sciatica - Plan: CBC with Differential/Platelet, Comprehensive Metabolic Panel (CMET), DG Abd 1 View  Screening for STD (sexually transmitted disease) - Plan: Cytology - PAP, Ambulatory  referral to Gynecology  Encounter for Papanicolaou smear for cervical cancer screening - Plan: Cytology - PAP, Ambulatory referral to Gynecology  Warts, genital - Plan: Ambulatory referral to Gynecology  Depression, major, single episode, moderate (HCC)  COPD exacerbation (  HCC), Chronic  Enlarged thoracic aorta (HCC), Chronic  Tobacco use disorder, continuous  Declined blood STD testing.  Smoking cessation advised.  She will follow up with gynecology since she was unable to tolerate self treatment of genital warts.  She need follow up with Chrismon, Jodell Cipro, PA for routine health maintenance and she will schedule this.   Reviewed above diagnosis with patient.  Orders Placed This Encounter  Procedures  . Urine Culture  . DG Abd 1 View    Order Specific Question:   Reason for Exam (SYMPTOM  OR DIAGNOSIS REQUIRED)    Answer:   she has left lower back pain and urinary symptoms rule out kidney stones.    Order Specific Question:   Is patient pregnant?    Answer:   No    Order Specific Question:   Preferred imaging location?    Answer:   [facility]    Order Specific Question:   Call Results- Best Contact Number?    Answer:   [phone]    Order Specific Question:   Radiology Contrast Protocol - do NOT remove file path    Answer:   [email or web address]  . CBC with Differential/Platelet  . Comprehensive Metabolic Panel (CMET)  . Ambulatory referral to Gynecology    Referral Priority:   Routine    Referral Type:   Consultation    Referral Reason:   Specialty Services Required    Requested Specialty:   Gynecology    Number of Visits Requested:   1  . POCT urinalysis dipstick   Will treat Urinary tract infection by POCT urine, sent for culture. STD testing with PAP smear.  Meds ordered this encounter  Medications  . doxycycline (VIBRA-TABS) 100 MG tablet    Sig: Take 1 tablet (100 mg total) by mouth 2 (two) times daily.     Dispense:  20 tablet    Refill:  0     Advised patient call the office or your primary care doctor for an appointment if no improvement within 72 hours or if any symptoms change or worsen at any time  Advised ER or urgent Care if after hours or on weekend. Call 911 for emergency symptoms at any time.Patinet verbalized understanding of all instructions given/reviewed and treatment plan and has no further questions or concerns at this time.    RED flag symptoms discussed and when to seek care immediately.   The entirety of the information documented in the History of Present Illness, Review of Systems and Physical Exam were personally obtained by me. Portions of this information were initially documented by the  Certified Medical Assistant whose name is documented in Epic and reviewed by me for thoroughness and accuracy.  I have personally performed the exam and reviewed the chart and it is accurate to the best of my knowledge.  Museum/gallery conservator has been used and any errors in dictation or transcription are unintentional.  Eula Fried. Flinchum FNP-C  [facility]   Jairo Ben, FNP  [facility]

## 2019-05-17 ENCOUNTER — Ambulatory Visit
Admission: RE | Admit: 2019-05-17 | Discharge: 2019-05-17 | Disposition: A | Discharge status: Home / Self Care | Payer: BLUE CROSS/BLUE SHIELD | Source: Ambulatory Visit | Attending: Adult Health | Admitting: Adult Health

## 2019-05-17 ENCOUNTER — Other Ambulatory Visit: Payer: Self-pay

## 2019-05-17 DIAGNOSIS — M545 Low back pain: Secondary | ICD-10-CM | POA: Diagnosis not present

## 2019-05-17 DIAGNOSIS — Z124 Encounter for screening for malignant neoplasm of cervix: Secondary | ICD-10-CM | POA: Insufficient documentation

## 2019-05-17 DIAGNOSIS — F321 Major depressive disorder, single episode, moderate: Secondary | ICD-10-CM | POA: Insufficient documentation

## 2019-05-17 DIAGNOSIS — R319 Hematuria, unspecified: Secondary | ICD-10-CM | POA: Diagnosis not present

## 2019-05-17 DIAGNOSIS — F17209 Nicotine dependence, unspecified, with unspecified nicotine-induced disorders: Secondary | ICD-10-CM | POA: Insufficient documentation

## 2019-05-17 DIAGNOSIS — R300 Dysuria: Secondary | ICD-10-CM | POA: Diagnosis not present

## 2019-05-17 NOTE — Progress Notes (Signed)
No kidney stone or abnormality on abdominal x ray. Continue current treatment plan. Pending results of PAP and testing/urine culture. Return if worsening.

## 2019-05-18 ENCOUNTER — Telehealth: Payer: Self-pay

## 2019-05-18 LAB — URINE CULTURE

## 2019-05-18 NOTE — Telephone Encounter (Signed)
Copied from CRM [id number]. Topic: General - Other >> May 18, 2019 10:48 AM Herby Abraham C wrote: Reason for CRM: pt is returning the office call for results.   Please assist.

## 2019-05-18 NOTE — Progress Notes (Signed)
E coli in urine is susceptible to doxycycline . Return if symptoms not improving or resolved with treatment/

## 2019-05-22 LAB — CYTOLOGY - PAP
Chlamydia: NEGATIVE
Comment: NEGATIVE
Comment: NEGATIVE
Comment: NEGATIVE
Comment: NEGATIVE
Comment: NORMAL
Diagnosis: NEGATIVE
HSV1: NEGATIVE
HSV2: NEGATIVE
High risk HPV: NEGATIVE
Neisseria Gonorrhea: NEGATIVE
Trichomonas: NEGATIVE

## 2019-05-22 NOTE — Progress Notes (Signed)
PAP resulted negative  for HPV, Gonorrhea,chlamydia, trichomonas, HSV1, HSV2, and any malignancy or lesion of the cervix. I do still recommend she see gynecology for removal of suspected genital warts that have spread.  Repeat PAP in one year, if no changing symptoms or concerns.

## 2019-06-05 ENCOUNTER — Encounter: Payer: BLUE CROSS/BLUE SHIELD | Admitting: Obstetrics and Gynecology

## 2019-06-28 ENCOUNTER — Encounter: Payer: BLUE CROSS/BLUE SHIELD | Admitting: Obstetrics and Gynecology

## 2019-07-08 ENCOUNTER — Ambulatory Visit: Payer: BLUE CROSS/BLUE SHIELD | Attending: Internal Medicine

## 2019-07-08 DIAGNOSIS — Z23 Encounter for immunization: Secondary | ICD-10-CM

## 2019-07-08 NOTE — Progress Notes (Signed)
   Covid-19 Vaccination Clinic  Name:  Tamara Hoffman    MRN: [id number] DOB: 10-20-1965  07/08/2019  Tamara Hoffman was observed post Covid-19 immunization for 15 minutes without incident. She was provided with Vaccine Information Sheet and instruction to access the V-Safe system.   Tamara Hoffman was instructed to call 911 with any severe reactions post vaccine: Marland Kitchen Difficulty breathing  . Swelling of face and throat  . A fast heartbeat  . A bad rash all over body  . Dizziness and weakness   Immunizations Administered    Name Date Dose VIS Date Route   Pfizer COVID-19 Vaccine 07/08/2019 10:30 AM 0.3 mL 04/26/2018 Intramuscular   Manufacturer: [employer], [employer]   Lot: [id number]   NDC: [address]-0

## 2019-07-20 ENCOUNTER — Encounter: Payer: BLUE CROSS/BLUE SHIELD | Admitting: Obstetrics and Gynecology

## 2019-08-01 ENCOUNTER — Ambulatory Visit: Payer: BLUE CROSS/BLUE SHIELD

## 2019-08-04 ENCOUNTER — Ambulatory Visit: Payer: BLUE CROSS/BLUE SHIELD | Attending: Internal Medicine

## 2019-08-04 DIAGNOSIS — Z23 Encounter for immunization: Secondary | ICD-10-CM

## 2019-08-04 NOTE — Progress Notes (Signed)
   Covid-19 Vaccination Clinic  Name:  Tamara Hoffman    MRN: [id number] DOB: 12-22-1965  08/04/2019  [name] was observed post Covid-19 immunization for 15 minutes without incident. She was provided with Vaccine Information Sheet and instruction to access the V-Safe system.   [name] was instructed to call 911 with any severe reactions post vaccine: Marland Kitchen Difficulty breathing  . Swelling of face and throat  . A fast heartbeat  . A bad rash all over body  . Dizziness and weakness   Immunizations Administered    Name Date Dose VIS Date Route   Pfizer COVID-19 Vaccine 08/04/2019 11:34 AM 0.3 mL 04/26/2018 Intramuscular   Manufacturer: [employer], [employer]   Lot: [id number]   NDC: [address]-4

## 2019-08-08 ENCOUNTER — Other Ambulatory Visit: Payer: Self-pay

## 2019-08-08 ENCOUNTER — Ambulatory Visit (INDEPENDENT_AMBULATORY_CARE_PROVIDER_SITE_OTHER): Payer: BLUE CROSS/BLUE SHIELD | Admitting: Obstetrics and Gynecology

## 2019-08-08 ENCOUNTER — Encounter: Payer: Self-pay | Admitting: Obstetrics and Gynecology

## 2019-08-08 VITALS — BP 130/80 | Ht 64.0 in | Wt 206.8 lb

## 2019-08-08 DIAGNOSIS — B373 Candidiasis of vulva and vagina: Secondary | ICD-10-CM | POA: Diagnosis not present

## 2019-08-08 DIAGNOSIS — B3731 Acute candidiasis of vulva and vagina: Secondary | ICD-10-CM

## 2019-08-08 MED ORDER — NYSTATIN 100000 UNIT/GM EX POWD: 1.0000 "application " | Freq: Two times a day (BID) | CUTANEOUS | 0 refills | Status: DC

## 2019-08-08 MED ORDER — FLUCONAZOLE 150 MG PO TABS: 150.0000 mg | ORAL_TABLET | ORAL | 0 refills | Status: AC

## 2019-08-08 NOTE — Progress Notes (Signed)
Patient ID: Tamara Hoffman, female   DOB: 11/21/1965, 54 y.o.   MRN: [id number]  Reason for Consult: Routine Prenatal Visit   Referred by Berniece Pap, F*  Subjective:     HPI:  Tamara Hoffman is a 54 y.o. female patient presents today with complaints of genital warts.  She reports that the warts have been present for about 1 year.  She reports that the warts worsened after recent hospitalization and taking prednisone.  She additionally complains of vulvar vaginal pain.  She reports that she has been having severe itching of her perineum.  She did use imiquimod ointment at home but had severe burning after application.   Past Medical History:  Diagnosis Date  . GERD (gastroesophageal reflux disease)   . Hypertension    Family History  Problem Relation Age of Onset  . Diabetes Mother   . Heart Problems Mother   . Heart failure Mother   . Hypertension Father   . Heart Problems Father   . Coronary artery disease Father   . Diabetes Sister   . Heart murmur Sister   . Cancer Maternal Grandmother   . Heart murmur Sister    Past Surgical History:  Procedure Laterality Date  . LEEP      Short Social History:  Social History   Tobacco Use  . Smoking status: Current Every Day Smoker    Packs/day: 1.00    Years: 22.00    Pack years: 22.00    Types: Cigarettes  . Smokeless tobacco: Never Used  Substance Use Topics  . Alcohol use: Yes    Alcohol/week: 5.0 - 7.0 standard drinks    Types: 5 - 7 Cans of beer per week    Comment: now is almost every day/occasional    Allergies  Allergen Reactions  . Promethazine Hcl Swelling    Current Outpatient Medications  Medication Sig Dispense Refill  . amLODipine (NORVASC) 10 MG tablet Take 1 tablet (10 mg total) by mouth daily. 90 tablet 3  . fluconazole (DIFLUCAN) 150 MG tablet Take 1 tablet (150 mg total) by mouth every 3 (three) days for 2 doses. 2 tablet 0  . nystatin (MYCOSTATIN/NYSTOP) powder Apply 1 application  topically 2 (two) times daily. 45 g 0   No current facility-administered medications for this visit.    Review of Systems  Constitutional: Negative for chills, fatigue, fever and unexpected weight change.  HENT: Negative for trouble swallowing.  Eyes: Negative for loss of vision.  Respiratory: Negative for cough, shortness of breath and wheezing.  Cardiovascular: Negative for chest pain, leg swelling, palpitations and syncope.  GI: Negative for abdominal pain, blood in stool, diarrhea, nausea and vomiting.  GU: Negative for difficulty urinating, dysuria, frequency and hematuria.  Musculoskeletal: Negative for back pain, leg pain and joint pain.  Skin: Positive for rash.  Neurological: Negative for dizziness, headaches, light-headedness, numbness and seizures.  Psychiatric: Negative for behavioral problem, confusion, depressed mood and sleep disturbance.        Objective:  Objective   Vitals:   08/08/19 1438  BP: 130/80  Weight: 206 lb 12.8 oz (93.8 kg)  Height: 5\' 4"  (1.626 m)   Body mass index is 35.5 kg/m.  Physical Exam Vitals and nursing note reviewed.  Constitutional:      Appearance: She is well-developed.  HENT:     Head: Normocephalic and atraumatic.  Eyes:     Pupils: Pupils are equal, round, and reactive to light.  Cardiovascular:  Rate and Rhythm: Normal rate and regular rhythm.  Pulmonary:     Effort: Pulmonary effort is normal. No respiratory distress.  Genitourinary:   Skin:    General: Skin is warm and dry.  Neurological:     Mental Status: She is alert and oriented to person, place, and time.  Psychiatric:        Behavior: Behavior normal.        Thought Content: Thought content normal.        Judgment: Judgment normal.     GYNECOLOGY CLINIC PROCEDURE NOTE  Cryotherapy details  Indication: Pt has a history of Genital Warts  The indications for cryotherapy were reviewed with the patient in detail. She was counseled about that efficacy  of this procedure, and possible need for repeat cryotherapy. The risks of the procedure where explained in detail and recovery was explained. All her questions were answered, and written informed consent was obtained.  The patient was placed in the dorsal lithotomy position. Genital warts were visualized. The appropriate cryotherapy probes were picked and then affixed to cryotherapy apparatus. Nitrogen gas was then activated, the probe was applied to the genital warts. This was kept in place for 30 seconds on each lesion. 7 lesions were treated in total.   The cryotherapy was then stopped and all instruments were removed.   The patient tolerated the procedure well without any complications. Routine post procedure instructions were given to the patient.  Will follow up in 1 month.       Assessment/Plan:     54 year old with genital warts and cutaneous vulvar candidiasis  Genital warts were treated today with cryotherapy.  She understands that it may take multiple cryotherapy sessions for cure to be achieved.  She will return in 3 to 4 weeks for repeat therapy.    We will treat cutaneous vulvar candidiasis with topical nystatin powder, Lamisil ointment twice a day over-the-counter and oral Diflucan.   More than 30 minutes were spent face to face with the patient in the room, reviewing the medical record, labs and images, and coordinating care for the patient. The plan of management was discussed in detail and counseling was provided.     Adrian Prows MD [facility], [facility] 08/08/2019 3:15 PM

## 2019-08-08 NOTE — Patient Instructions (Addendum)
Vanibliss Topical Lamisil Twice a Day until resolution   Genital Warts Genital warts are a common STD (sexually transmitted disease). They may appear as small bumps on the skin of the genital and anal areas. They sometimes become irritated and cause pain. Genital warts are easily passed to other people through sexual contact. Many people do not know that they are infected. They may be infected for years without symptoms. However, even if they do not have symptoms, they can pass the infection to their sexual partners. Getting treatment is important because genital warts can lead to other problems. In females, the virus that causes genital warts may increase the risk for cervical cancer. What are the causes? This condition is caused by a virus that is called human papillomavirus (HPV). HPV is spread by having unprotected sex with an infected person. It can be spread through vaginal, anal, and oral sex. What increases the risk? You are more likely to develop this condition if:  You have unprotected sex.  You have multiple sexual partners.  You are sexually active before age 62.  You are a man who isnot circumcised.  You have a female sexual partner who is not circumcised.  You have a weakened body defense system (immune system) due to disease or medicine.  You smoke. What are the signs or symptoms? Symptoms of this condition include:  Small growths in the genital area or anal area. These warts often grow in clusters.  Itching and irritation in the genital area or anal area.  Bleeding from the warts.  Pain during sex. How is this diagnosed? This condition is diagnosed based on:  Your symptoms.  A physical exam. You may also have other tests, including:  Biopsy. A tissue sample is removed so it can be checked under a microscope.  Colposcopy. In females, a magnifying tool is used to examine the vagina and cervix. Certain solutions may be used to make the HPV cells change color  so they can be seen more easily.  A Pap test in females.  Tests for other STDs. How is this treated? This condition may be treated with:  Medicines, such as: ? Solutions or creams applied to your skin (topical).  Procedures, such as: ? Freezing the warts with liquid nitrogen (cryotherapy). ? Burning the warts with a laser or electric probe (electrocautery). ? Surgery to remove the warts. Follow these instructions at home: Medicines   Apply over-the-counter and prescription medicines only as told by your health care provider.  Do not treat genital warts with medicines that are used for treating hand warts.  Talk with your health care provider about using over-the-counter anti-itch creams. Instructions for women  Get screened regularly for cervical cancer. Women who have genital warts are at an increased risk for this cancer. This type of cancer is slow growing and can be cured if it is found early.  If you become pregnant, tell your health care provider that you have had an HPV infection. Your health care provider will monitor you closely during pregnancy to be sure that your baby is safe. General instructions  Do not touch or scratch the warts.  Do not have sex until your treatment has been completed.  Tell your current and past sexual partners about your condition because they may also need treatment.  After treatment, use condoms during sex to prevent future infections.  Keep all follow-up visits as told by your health care provider. This is important. How is this prevented? Talk with your health  care provider about getting the HPV vaccine. The vaccine:  Can prevent some HPV infections and cancers.  Is recommended for males and females who are 65-74 years old.  Is not recommended for pregnant women.  Will not work if you already have HPV. Contact a health care provider if you:  Have redness, swelling, or pain in the area of the treated skin.  Have a  fever.  Feel generally ill.  Feel lumps in and around your genital or anal area.  Have bleeding in your genital or anal area.  Have pain during sex. Summary  Genital warts are a common STD (sexually transmitted disease). It may appear as small bumps on the genital and anal areas.  This condition is caused by a virus that is called human papillomavirus (HPV). HPV is spread by having unprotected sex with an infected person. It can be spread through vaginal, anal, and oral sex.  Treatment is important because genital warts can lead to other problems. In females, the virus that causes genital warts may increase the risk for cervical cancer.  This condition may be treated with medicine that is applied to the skin, or procedures to remove the warts.  The HPV vaccine can prevent some HPV infections and cancers. It is recommended that the vaccine be given to males and females who are 26-73 years old. This information is not intended to replace advice given to you by your health care provider. Make sure you discuss any questions you have with your health care provider. Document Revised: 03/23/2017 Document Reviewed: 03/23/2017 Elsevier Patient Education  2020 [employer].

## 2019-09-07 ENCOUNTER — Ambulatory Visit: Payer: BLUE CROSS/BLUE SHIELD | Admitting: Obstetrics and Gynecology

## 2019-10-30 DIAGNOSIS — M159 Polyosteoarthritis, unspecified: Secondary | ICD-10-CM | POA: Diagnosis not present

## 2019-10-30 DIAGNOSIS — M19041 Primary osteoarthritis, right hand: Secondary | ICD-10-CM | POA: Diagnosis not present

## 2019-10-30 DIAGNOSIS — M19032 Primary osteoarthritis, left wrist: Secondary | ICD-10-CM | POA: Diagnosis not present

## 2019-10-30 DIAGNOSIS — M064 Inflammatory polyarthropathy: Secondary | ICD-10-CM | POA: Diagnosis not present

## 2019-10-30 DIAGNOSIS — M19042 Primary osteoarthritis, left hand: Secondary | ICD-10-CM | POA: Diagnosis not present

## 2019-10-30 DIAGNOSIS — M1811 Unilateral primary osteoarthritis of first carpometacarpal joint, right hand: Secondary | ICD-10-CM | POA: Diagnosis not present

## 2019-10-30 DIAGNOSIS — M1812 Unilateral primary osteoarthritis of first carpometacarpal joint, left hand: Secondary | ICD-10-CM | POA: Diagnosis not present

## 2019-10-30 DIAGNOSIS — Z79899 Other long term (current) drug therapy: Secondary | ICD-10-CM | POA: Diagnosis not present

## 2019-10-31 ENCOUNTER — Other Ambulatory Visit: Payer: Self-pay | Admitting: Physician Assistant

## 2019-10-31 DIAGNOSIS — G479 Sleep disorder, unspecified: Secondary | ICD-10-CM

## 2019-12-07 DIAGNOSIS — Z79899 Other long term (current) drug therapy: Secondary | ICD-10-CM | POA: Diagnosis not present

## 2019-12-07 DIAGNOSIS — M064 Inflammatory polyarthropathy: Secondary | ICD-10-CM | POA: Diagnosis not present

## 2019-12-20 DIAGNOSIS — Z79899 Other long term (current) drug therapy: Secondary | ICD-10-CM | POA: Diagnosis not present

## 2019-12-20 DIAGNOSIS — M064 Inflammatory polyarthropathy: Secondary | ICD-10-CM | POA: Diagnosis not present

## 2019-12-20 DIAGNOSIS — M159 Polyosteoarthritis, unspecified: Secondary | ICD-10-CM | POA: Diagnosis not present

## 2019-12-25 IMAGING — CR CHEST - 2 VIEW
3 series · 3 of 3 positions shown · non-contrast
Comparison: Chest radiograph dated 10/08/2018

CLINICAL DATA: 51-year-old female with chest pain.

EXAM:
CHEST - 2 VIEW

[chest pa]
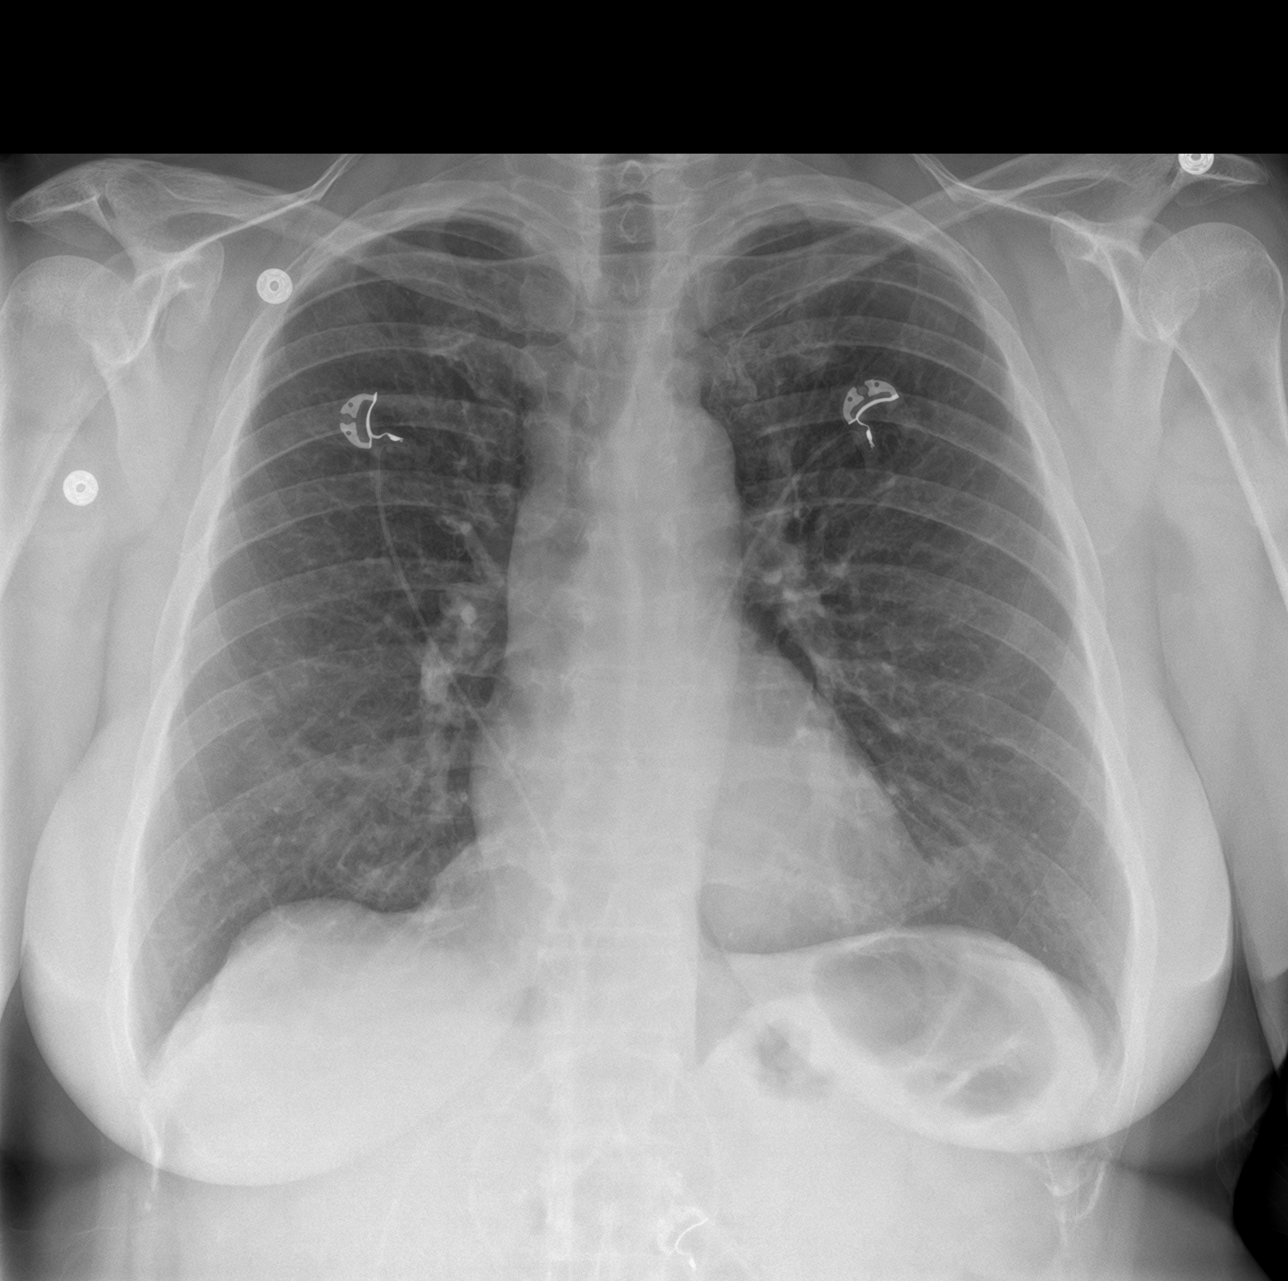

[chest lat (1 of 2)]
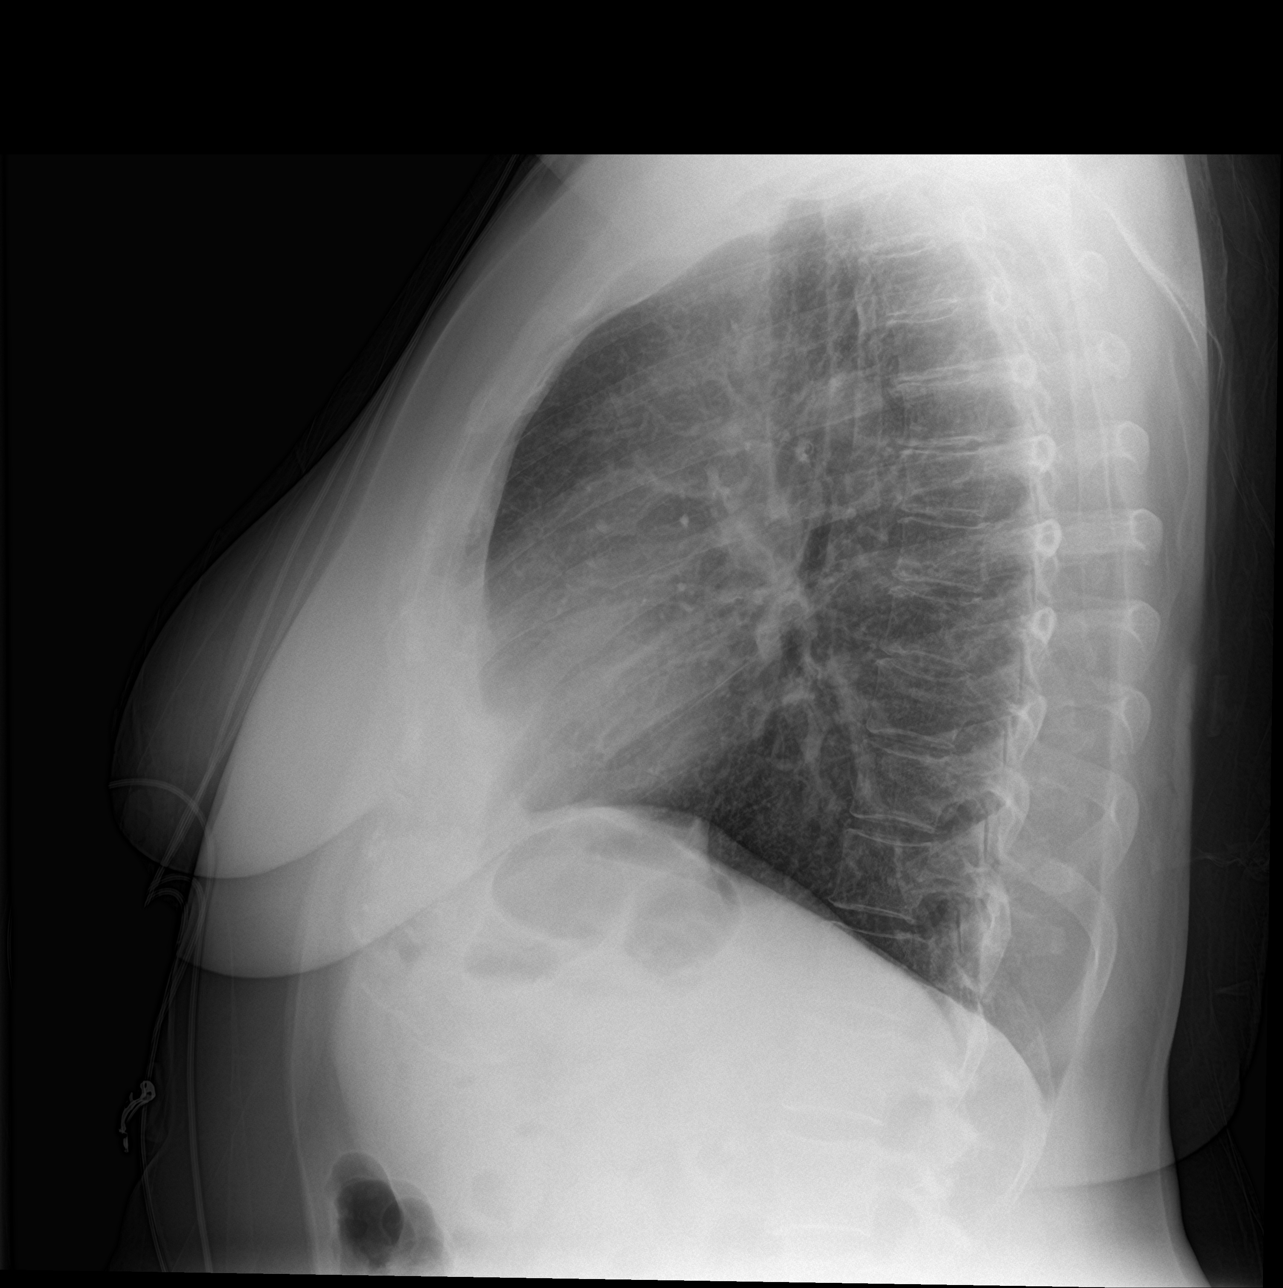

[chest lat (2 of 2)]
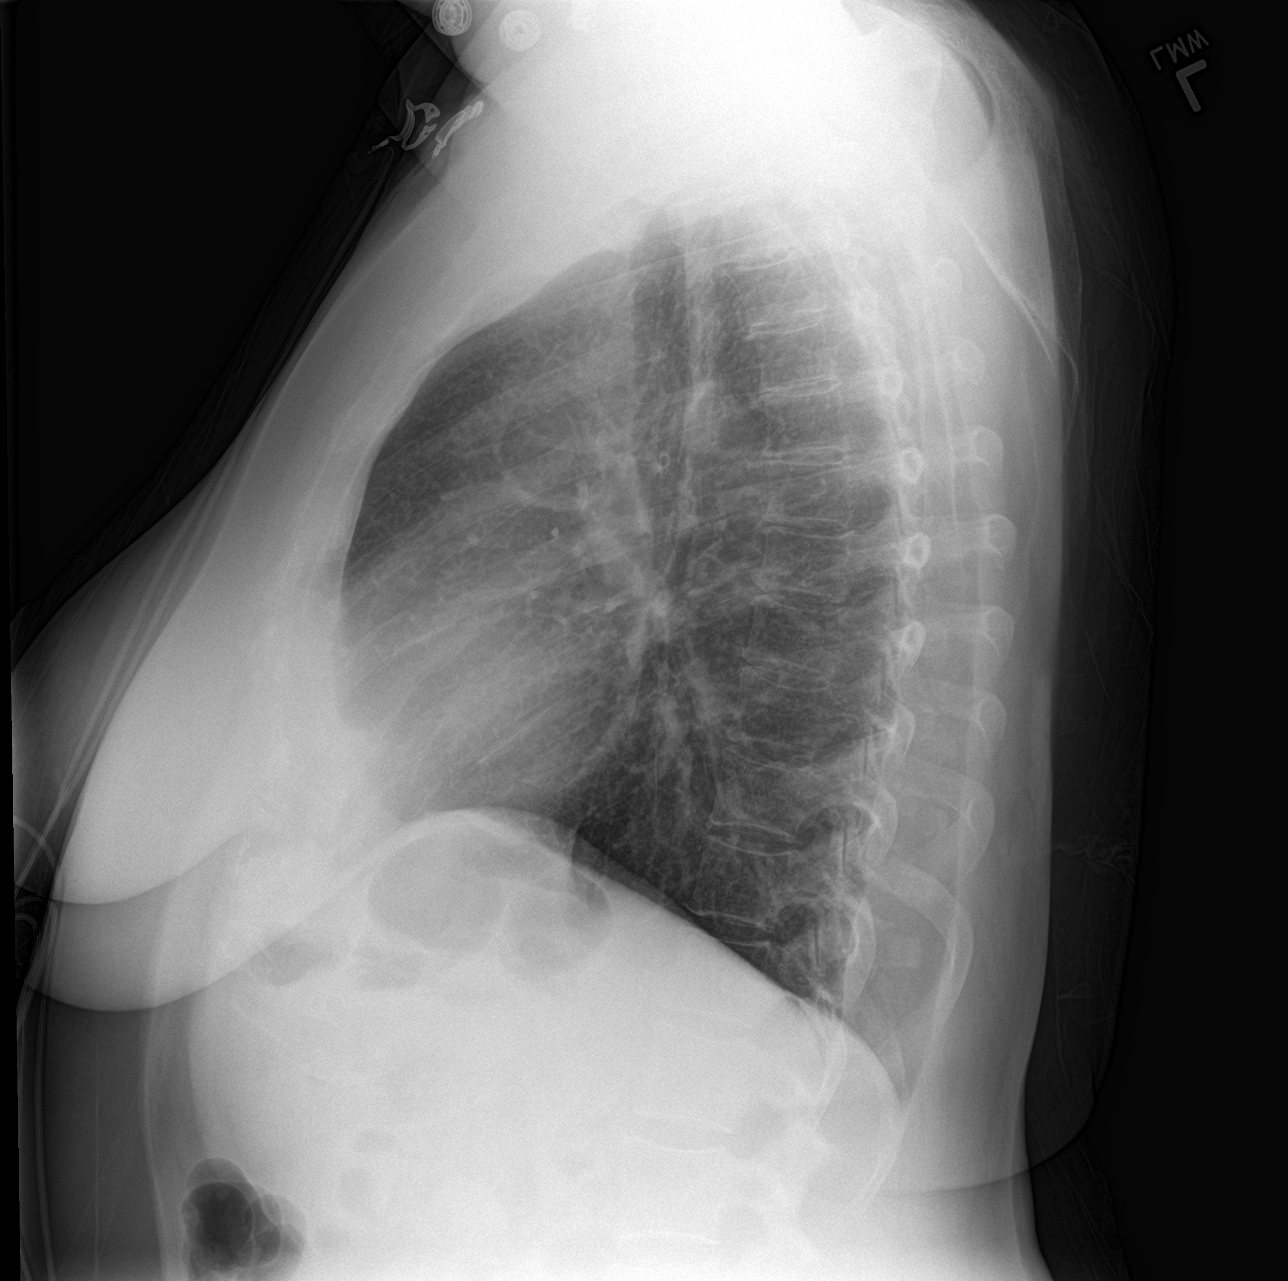

[3 of 3 positions shown; findings below may reference images not displayed]

FINDINGS: The heart size and mediastinal contours are within normal limits.
Both lungs are clear. The visualized skeletal structures are
unremarkable.
IMPRESSION: No active cardiopulmonary disease.

## 2020-02-02 ENCOUNTER — Encounter: Payer: Self-pay | Admitting: Family Medicine

## 2020-05-21 ENCOUNTER — Ambulatory Visit: Payer: Self-pay

## 2020-05-21 NOTE — Telephone Encounter (Signed)
Pt. Reports she started having cough and runny nose 3 days ago. "Getting worse, I can't sleep at night." Non-productive cough. Gets short of breath with coughing spells. No virtual visit available. Pt. Requesting "something for cough be called in." Please advise pt.  Reason for Disposition . [1] MILD difficulty breathing (e.g., minimal/no SOB at rest, SOB with walking, pulse <100) AND [2] still present when not coughing  Answer Assessment - Initial Assessment Questions 1. ONSET: "When did the cough begin?"      3 days ago 2. SEVERITY: "How bad is the cough today?"      Severe 3. SPUTUM: "Describe the color of your sputum" (none, dry cough; clear, white, yellow, green)     None 4. HEMOPTYSIS: "Are you coughing up any blood?" If so ask: "How much?" (flecks, streaks, tablespoons, etc.)     No 5. DIFFICULTY BREATHING: "Are you having difficulty breathing?" If Yes, ask: "How bad is it?" (e.g., mild, moderate, severe)    - MILD: No SOB at rest, mild SOB with walking, speaks normally in sentences, can lay down, no retractions, pulse < 100.    - MODERATE: SOB at rest, SOB with minimal exertion and prefers to sit, cannot lie down flat, speaks in phrases, mild retractions, audible wheezing, pulse 100-120.    - SEVERE: Very SOB at rest, speaks in single words, struggling to breathe, sitting hunched forward, retractions, pulse > 120      Mild 6. FEVER: "Do you have a fever?" If Yes, ask: "What is your temperature, how was it measured, and when did it start?"     No 7. CARDIAC HISTORY: "Do you have any history of heart disease?" (e.g., heart attack, congestive heart failure)      No 8. LUNG HISTORY: "Do you have any history of lung disease?"  (e.g., pulmonary embolus, asthma, emphysema)     Chronic bronchitis 9. PE RISK FACTORS: "Do you have a history of blood clots?" (or: recent major surgery, recent prolonged travel, bedridden)     No 10. OTHER SYMPTOMS: "Do you have any other symptoms?" (e.g., runny  nose, wheezing, chest pain)       Runny nose, wheezing 11. PREGNANCY: "Is there any chance you are pregnant?" "When was your last menstrual period?"       No 12. TRAVEL: "Have you traveled out of the country in the last month?" (e.g., travel history, exposures)       No  Protocols used: COUGH - ACUTE NON-PRODUCTIVE-A-AH

## 2020-05-21 NOTE — Telephone Encounter (Signed)
Needs a virtual appointment with someone.

## 2020-05-21 NOTE — Telephone Encounter (Signed)
Patient is requesting something to help with her cough. She has had symptoms X 3 days. Reports that she has coughing spells at night. Denies fever, chills, or shortness of breath. Please advise. Thanks!

## 2020-05-21 NOTE — Telephone Encounter (Signed)
Advised patient as below. We do not have any appts until Friday (3/25). Patient did not want to wait that long. Recommended that she go to Urgent Care to be evaluated. Patient declined. She reports that it is very expensive for her to go there. She reports that she will keep try OTC meds for now. Advised that if cough worsens or if develops severe shortness of breath, she will need to go to the ER. Patient verbalized understanding.

## 2020-07-15 ENCOUNTER — Other Ambulatory Visit (HOSPITAL_COMMUNITY): Payer: Self-pay

## 2020-07-15 IMAGING — CR DG ABDOMEN 1V
1 series · 2 of 2 positions shown · non-contrast
Comparison: 07/20/2005

CLINICAL DATA: Low back pain with hematuria.

EXAM:
ABDOMEN - 1 VIEW

[Series 1: dg abd 1 view · 0.14mm/px · 2 of 2 slices shown]
[im 1/2]
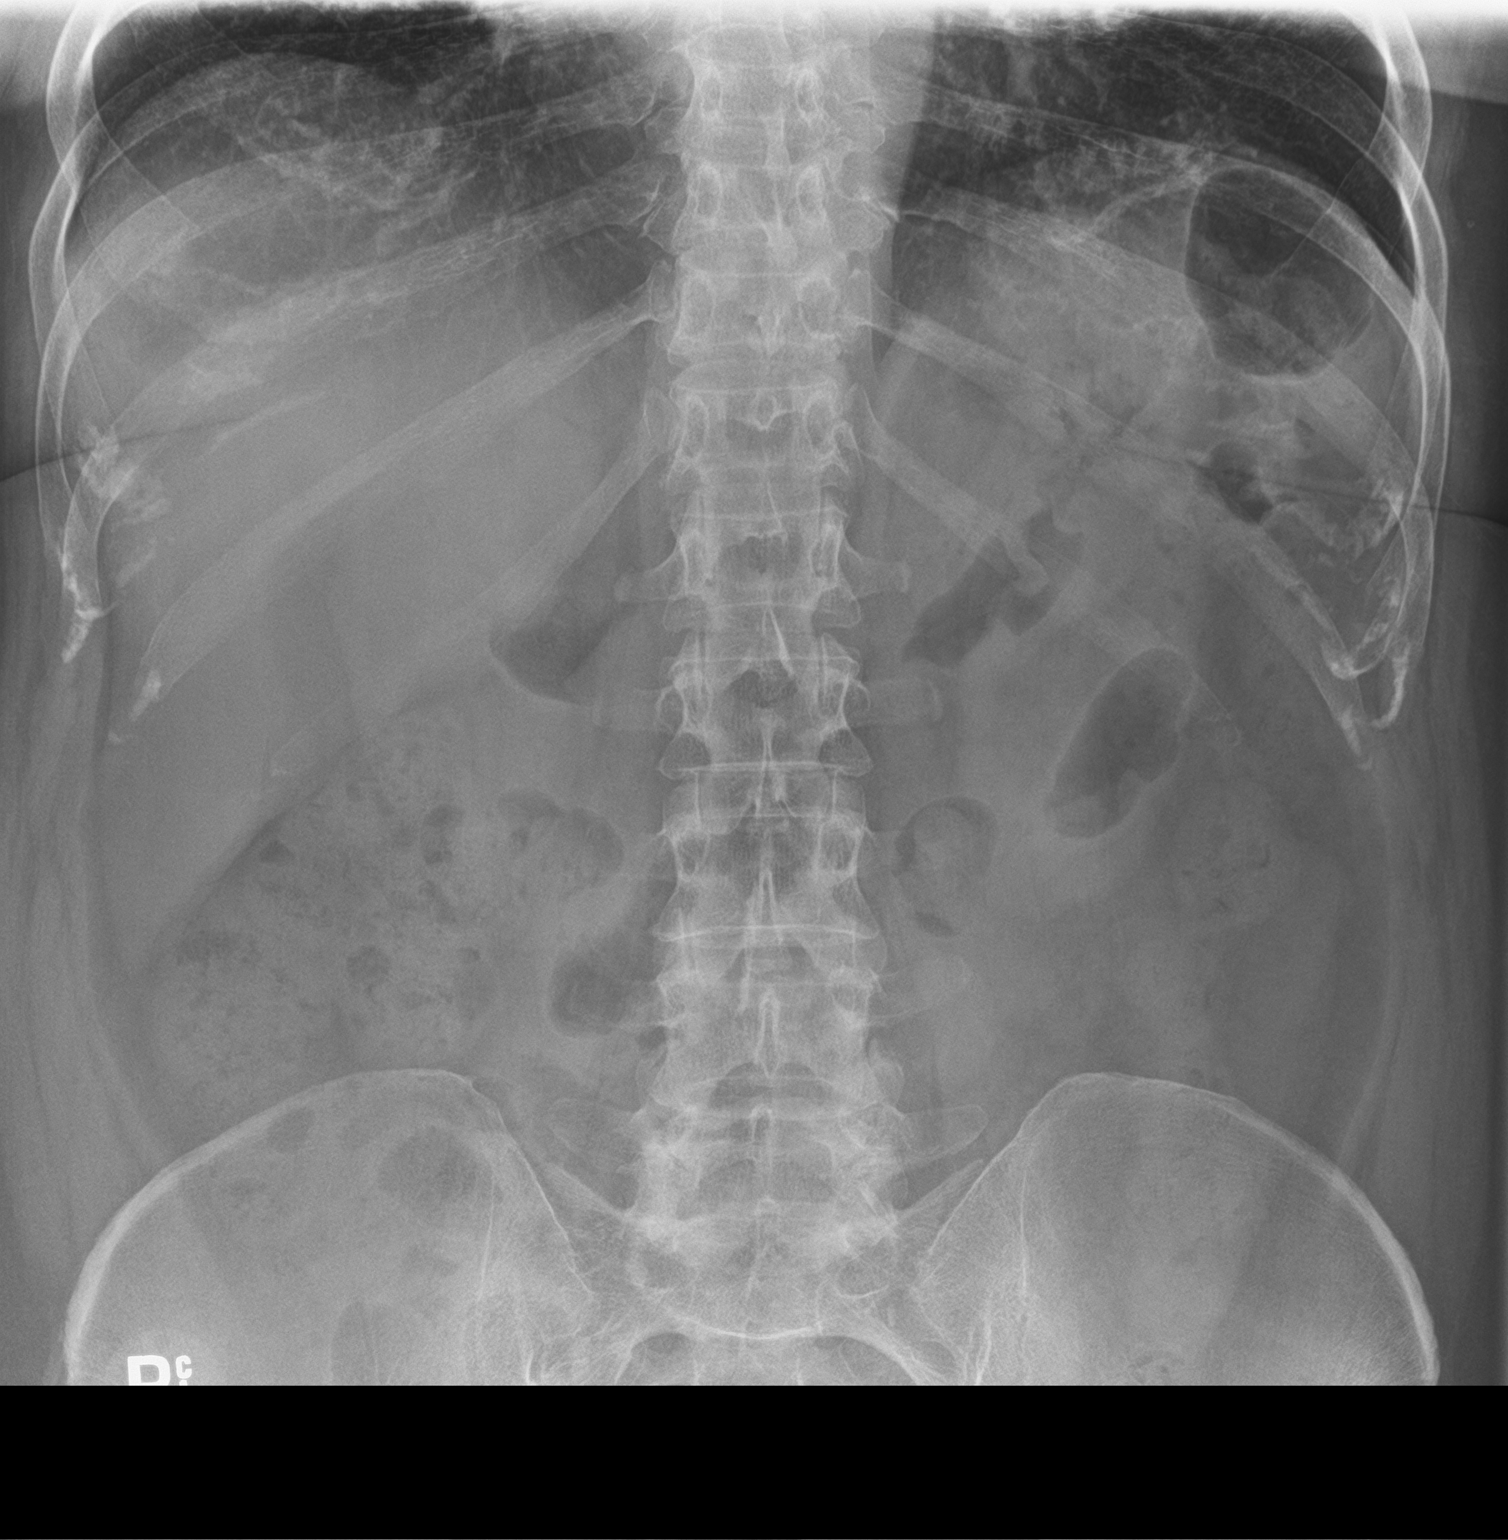
[im 2/2]
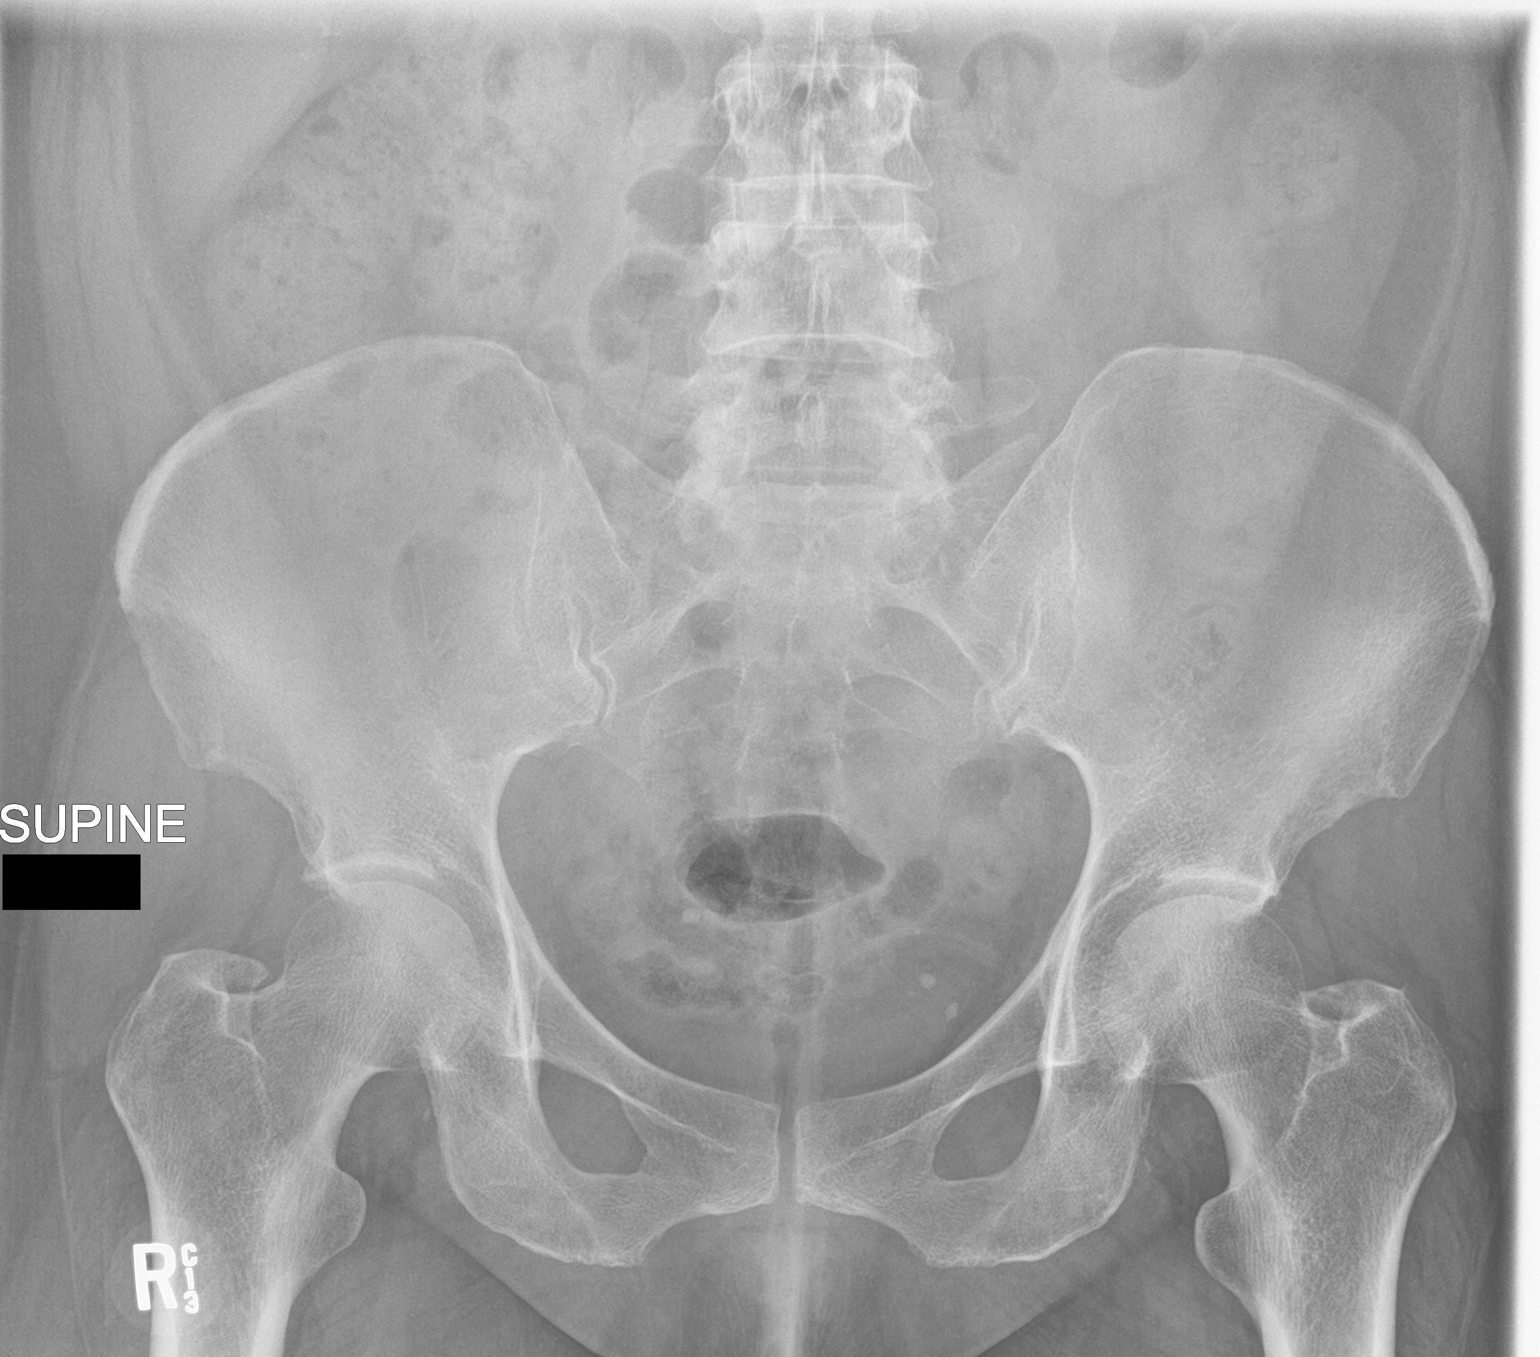

[2 of 2 positions shown; findings below may reference images not displayed]

FINDINGS: The bowel gas pattern is normal. No radio-opaque urinary calculi or
other significant radiographic abnormality are seen.
IMPRESSION: Negative.

## 2021-03-06 ENCOUNTER — Ambulatory Visit (INDEPENDENT_AMBULATORY_CARE_PROVIDER_SITE_OTHER): Payer: 59 | Admitting: Physician Assistant

## 2021-03-06 ENCOUNTER — Other Ambulatory Visit: Payer: Self-pay

## 2021-03-06 ENCOUNTER — Encounter: Payer: Self-pay | Admitting: Physician Assistant

## 2021-03-06 VITALS — BP 134/89 | HR 114 | Temp 98.8°F | Resp 20 | Wt 200.0 lb

## 2021-03-06 DIAGNOSIS — R062 Wheezing: Secondary | ICD-10-CM

## 2021-03-06 DIAGNOSIS — B372 Candidiasis of skin and nail: Secondary | ICD-10-CM | POA: Diagnosis not present

## 2021-03-06 DIAGNOSIS — E7849 Other hyperlipidemia: Secondary | ICD-10-CM | POA: Diagnosis not present

## 2021-03-06 DIAGNOSIS — I10 Essential (primary) hypertension: Secondary | ICD-10-CM

## 2021-03-06 DIAGNOSIS — T148XXD Other injury of unspecified body region, subsequent encounter: Secondary | ICD-10-CM | POA: Diagnosis not present

## 2021-03-06 MED ORDER — NYSTATIN 100000 UNIT/GM EX OINT: 1.0000 "application " | TOPICAL_OINTMENT | Freq: Two times a day (BID) | CUTANEOUS | 1 refills | Status: DC

## 2021-03-06 MED ORDER — CEPHALEXIN 500 MG PO CAPS: 500.0000 mg | ORAL_CAPSULE | Freq: Four times a day (QID) | ORAL | 0 refills | Status: AC

## 2021-03-06 MED ORDER — ALBUTEROL SULFATE HFA 108 (90 BASE) MCG/ACT IN AERS
2.0000 | INHALATION_SPRAY | Freq: Four times a day (QID) | RESPIRATORY_TRACT | 2 refills | Status: AC | PRN
Start: 2021-03-06 — End: ?

## 2021-03-06 NOTE — Progress Notes (Signed)
Established patient visit   Patient: Tamara Hoffman   DOB: Sep 12, 1965   56 y.o. Female  MRN: [id number] Visit Date: 03/06/2021  Today's healthcare provider: Mikey Kirschner, PA-C   Chief Complaint  Patient presents with   Rash   Subjective     Barbaraann is a 56 y/o female who presents today with concerns over a rash in multiple locations. It started a month ago w/ redness, pain, and itching under her left breast but has spread to under neath bilateral breasts, in her groin, and in/around her buttocks. She saw urgent care 03/03/21 who diagnosed her with intertrigo and rx diflucan 150 to take once a week for 4 weeks. She states a few days after she started the diflucan she started having vomiting and diarrhea. Reports some area of rash have expressed yellow pus, and a cut on her leg has never healed.   She reports wanting routine bloodwork as well.  Medications: Outpatient Medications Prior to Visit  Medication Sig   amLODipine (NORVASC) 10 MG tablet Take 1 tablet (10 mg total) by mouth daily.   fluconazole (DIFLUCAN) 150 MG tablet Take 150 mg by mouth once a week. X 4 weeks   [DISCONTINUED] nystatin (MYCOSTATIN/NYSTOP) powder Apply 1 application topically 2 (two) times daily.   No facility-administered medications prior to visit.    Review of Systems  Constitutional:  Negative for appetite change, chills, fatigue and fever.  Respiratory:  Negative for chest tightness and shortness of breath.   Cardiovascular:  Negative for chest pain and palpitations.  Gastrointestinal:  Positive for diarrhea, nausea and vomiting. Negative for abdominal pain.  Skin:  Positive for rash.  Neurological:  Negative for dizziness and weakness.     Objective    BP 134/89 (BP Location: Left Arm, Patient Position: Sitting, Cuff Size: Large)    Pulse (!) 114    Temp 98.8 F (37.1 C) (Oral)    Resp 20    Wt 200 lb (90.7 kg)    LMP  (LMP Unknown)    SpO2 99% Comment: room air   BMI 34.33 kg/m   Physical  Exam Constitutional:      General: She is awake.     Appearance: She is well-developed.  HENT:     Head: Normocephalic.  Eyes:     Conjunctiva/sclera: Conjunctivae normal.  Cardiovascular:     Rate and Rhythm: Normal rate and regular rhythm.     Heart sounds: Normal heart sounds.  Pulmonary:     Effort: Pulmonary effort is normal.     Breath sounds: Transmitted upper airway sounds present. No wheezing, rhonchi or rales.  Abdominal:     Palpations: Abdomen is soft.     Tenderness: There is no abdominal tenderness.  Skin:    General: Skin is warm.     Findings: Rash present.          Comments: Erythematous, excoriated rash with satellite papules underneath bilateral breasts, extensive in groin and vaginal area, in buttlock cleft and lower back. 4-5 cm scabbed erythematous lesion left upper thigh.  Neurological:     Mental Status: She is alert and oriented to person, place, and time.  Psychiatric:        Attention and Perception: Attention normal.        Mood and Affect: Mood normal.        Speech: Speech normal.        Behavior: Behavior is cooperative.     No results found  for any visits on 03/06/21.  Assessment & Plan     Problem List Items Addressed This Visit       Cardiovascular and Mediastinum   Primary hypertension    Will monitor. Likely elevated at urgent care and at todays visit secondary to pain       Relevant Orders   Comprehensive Metabolic Panel (CMET)     Musculoskeletal and Integument   Candidiasis of skin - Primary    Severe. Continue diflucan weekly. Rx nystatin ointment to use BID. Rx Keflex as many areas are weeping and may be secondarily infected.  ref to derm.      Relevant Medications   fluconazole (DIFLUCAN) 150 MG tablet   nystatin ointment (MYCOSTATIN)   cephALEXin (KEFLEX) 500 MG capsule   Other Relevant Orders   CBC   Ambulatory referral to Dermatology     Other   Wheezing    Unsure if ever officially diagnosed w/  COPD Chronic tobacco use. No wheezing on exam, increased lung sounds. Rx albuterol inhaler to have in cases of SOB/wheezing. Will monitor and reevaluate at next visit      Relevant Medications   albuterol (VENTOLIN HFA) 108 (90 Base) MCG/ACT inhaler   Other Visit Diagnoses     Delayed wound healing       Relevant Orders   CBC   HIV antibody (with reflex)   Ambulatory referral to Dermatology   Other hyperlipidemia       Relevant Orders   Lipid Profile        Return in about 2 months (around 05/04/2021) for hypertension.      I, Mikey Kirschner, PA-C have reviewed all documentation for this visit. The documentation on  03/06/2021 for the exam, diagnosis, procedures, and orders are all accurate and complete.    Mikey Kirschner, PA-C  [facility] [phone] (phone) [phone] (fax)  [place]

## 2021-03-06 NOTE — Assessment & Plan Note (Signed)
Will monitor. Likely elevated at urgent care and at todays visit secondary to pain

## 2021-03-06 NOTE — Assessment & Plan Note (Signed)
Unsure if ever officially diagnosed w/ COPD Chronic tobacco use. No wheezing on exam, increased lung sounds. Rx albuterol inhaler to have in cases of SOB/wheezing. Will monitor and reevaluate at next visit

## 2021-03-06 NOTE — Assessment & Plan Note (Signed)
Severe. Continue diflucan weekly. Rx nystatin ointment to use BID. Rx Keflex as many areas are weeping and may be secondarily infected.  ref to derm.

## 2021-03-07 LAB — COMPREHENSIVE METABOLIC PANEL
ALT: 13 IU/L (ref 0–32)
AST: 17 IU/L (ref 0–40)
Albumin/Globulin Ratio: 1.3 (ref 1.2–2.2)
Albumin: 4.4 g/dL (ref 3.8–4.9)
Alkaline Phosphatase: 138 IU/L — ABNORMAL HIGH (ref 44–121)
BUN/Creatinine Ratio: 11 (ref 9–23)
BUN: 12 mg/dL (ref 6–24)
Bilirubin Total: 0.3 mg/dL (ref 0.0–1.2)
CO2: 20 mmol/L (ref 20–29)
Calcium: 9.5 mg/dL (ref 8.7–10.2)
Chloride: 98 mmol/L (ref 96–106)
Creatinine, Ser: 1.1 mg/dL — ABNORMAL HIGH (ref 0.57–1.00)
Globulin, Total: 3.5 g/dL (ref 1.5–4.5)
Glucose: 107 mg/dL — ABNORMAL HIGH (ref 70–99)
Potassium: 4 mmol/L (ref 3.5–5.2)
Sodium: 137 mmol/L (ref 134–144)
Total Protein: 7.9 g/dL (ref 6.0–8.5)
eGFR: 60 mL/min/{1.73_m2} (ref >59)

## 2021-03-07 LAB — HIV ANTIBODY (ROUTINE TESTING W REFLEX): HIV Screen 4th Generation wRfx: NONREACTIVE

## 2021-03-07 LAB — CBC
Hematocrit: 45.6 % (ref 34.0–46.6)
Hemoglobin: 15.9 g/dL (ref 11.1–15.9)
MCH: 31.7 pg (ref 26.6–33.0)
MCHC: 34.9 g/dL (ref 31.5–35.7)
MCV: 91 fL (ref 79–97)
Platelets: 292 10*3/uL (ref 150–450)
RBC: 5.01 x10E6/uL (ref 3.77–5.28)
RDW: 13.4 % (ref 11.7–15.4)
WBC: 11.8 10*3/uL — ABNORMAL HIGH (ref 3.4–10.8)

## 2021-03-07 LAB — LIPID PANEL
Chol/HDL Ratio: 2.2 ratio (ref 0.0–4.4)
Cholesterol, Total: 189 mg/dL (ref 100–199)
HDL: 85 mg/dL (ref >39)
LDL Chol Calc (NIH): 88 mg/dL (ref 0–99)
Triglycerides: 88 mg/dL (ref 0–149)
VLDL Cholesterol Cal: 16 mg/dL (ref 5–40)

## 2021-03-10 ENCOUNTER — Other Ambulatory Visit: Payer: Self-pay | Admitting: Physician Assistant

## 2021-03-10 DIAGNOSIS — G479 Sleep disorder, unspecified: Secondary | ICD-10-CM

## 2021-03-10 NOTE — Telephone Encounter (Signed)
Pt called in to request a refill for  traZODone (DESYREL) 50 MG tablet. Pt says that she forgot to request at her ov the other day. Pt says that she uses them to help her sleep. Pt says that she hasn't had to take them in a while but feels that they are needed again. Not showing medication on current list.    Pharmacy:  [id number] [place], [place] - [address] AT [facility] [address] Phone:  [phone]  Fax:  [phone]

## 2021-03-10 NOTE — Telephone Encounter (Signed)
Requested medications are due for refill today.  no  Requested medications are on the active medications list.  no  Last refill. 10/03/2018  Future visit scheduled.   yes  Notes to clinic.  Pt called in to request a refill for  traZODone (DESYREL) 50 MG tablet. Pt says that she forgot to request at her ov the other day. Pt says that she uses them to help her sleep. Pt says that she hasn't had to take them in a while but feels that they are needed again. Not showing medication on current list.     Requested Prescriptions  Pending Prescriptions Disp Refills   traZODone (DESYREL) 50 MG tablet 30 tablet 3    Sig: Take 0.5-1 tablets (25-50 mg total) by mouth at bedtime as needed for sleep.     Psychiatry: Antidepressants - Serotonin Modulator Failed - 03/10/2021 12:20 PM      Failed - Completed PHQ-2 or PHQ-9 in the last 360 days      Passed - Valid encounter within last 6 months    Recent Outpatient Visits           4 days ago Candidiasis of skin   [facility] Mikey Kirschner, PA-C   1 year ago Cystitis with hematuria   [place], Kelby Aline, FNP   1 year ago [place], PA-C   2 years ago Sensation of foreign body in eye   [place], Dionne Bucy, MD   2 years ago Uncontrolled hypertension   [place], Driscilla Grammes       Future Appointments             In 3 days Ralene Bathe, MD [place]   In 3 months Fisher, Kirstie Peri, MD [facility], [place]

## 2021-03-12 MED ORDER — TRAZODONE HCL 50 MG PO TABS: 25.0000 mg | ORAL_TABLET | Freq: Every evening | ORAL | 0 refills | Status: DC | PRN

## 2021-03-12 NOTE — Telephone Encounter (Signed)
Pt has taken previously w/ success but stopped as she felt it wasn't needed. Currently feels like she needs again. F/u sched 4/23, will send 30 day rx but should schedule end of next month to recheck BP and discuss trazodone

## 2021-03-13 ENCOUNTER — Ambulatory Visit: Payer: BLUE CROSS/BLUE SHIELD | Admitting: Dermatology

## 2021-03-20 ENCOUNTER — Ambulatory Visit (INDEPENDENT_AMBULATORY_CARE_PROVIDER_SITE_OTHER): Payer: 59 | Admitting: Family Medicine

## 2021-03-20 ENCOUNTER — Ambulatory Visit: Payer: Self-pay | Admitting: *Deleted

## 2021-03-20 ENCOUNTER — Other Ambulatory Visit: Payer: Self-pay

## 2021-03-20 ENCOUNTER — Encounter: Payer: Self-pay | Admitting: Family Medicine

## 2021-03-20 VITALS — BP 156/108 | HR 103 | Resp 16 | Wt 199.0 lb

## 2021-03-20 DIAGNOSIS — K591 Functional diarrhea: Secondary | ICD-10-CM | POA: Diagnosis not present

## 2021-03-20 DIAGNOSIS — B372 Candidiasis of skin and nail: Secondary | ICD-10-CM

## 2021-03-20 DIAGNOSIS — L408 Other psoriasis: Secondary | ICD-10-CM | POA: Diagnosis not present

## 2021-03-20 DIAGNOSIS — L400 Psoriasis vulgaris: Secondary | ICD-10-CM | POA: Insufficient documentation

## 2021-03-20 DIAGNOSIS — F17200 Nicotine dependence, unspecified, uncomplicated: Secondary | ICD-10-CM

## 2021-03-20 MED ORDER — CLOBETASOL PROPIONATE 0.05 % EX SOLN: 1.0000 "application " | Freq: Two times a day (BID) | CUTANEOUS | 1 refills | Status: DC

## 2021-03-20 MED ORDER — PREDNISONE 10 MG PO TABS
10.0000 mg | ORAL_TABLET | Freq: Every day | ORAL | 0 refills | Status: DC
Start: 2021-03-20 — End: 2021-06-18

## 2021-03-20 MED ORDER — TRIAMCINOLONE ACETONIDE 0.5 % EX OINT: 1.0000 "application " | TOPICAL_OINTMENT | Freq: Two times a day (BID) | CUTANEOUS | 0 refills | Status: DC

## 2021-03-20 NOTE — Assessment & Plan Note (Signed)
Reports 2 BMs a day Denies sick contacts Denies travel Denies bleeding Has been using imodium for travel Referral to GI if continues

## 2021-03-20 NOTE — Assessment & Plan Note (Signed)
Missed derm appt d/t diarrhea; re-est referral today. Has improved in some areas; worse in others. No signs of weeping.

## 2021-03-20 NOTE — Telephone Encounter (Signed)
Reason for Disposition  Taking new prescription antibiotic (Exception: finished taking new prescription antibiotic)  Answer Assessment - Initial Assessment Questions 1. APPEARANCE of RASH: "Describe the rash." (e.g., spots, blisters, raised areas, skin peeling, scaly)     The rash has spread to everywhere.   It's bigger rash.   No pain.   The antibiotics are giving me diarrhea so bad.   I'm drinking Gator Aid.   I'm worried that this skin stuff.   I could not go to the appt to the skin dr but I could not due to the diarrhea.     I started a new job yesterday.     2. SIZE: "How big are the spots?" (e.g., tip of pen, eraser, coin; inches, centimeters)     They are bigger than they were. 3. LOCATION: "Where is the rash located?"     All over my body 4. COLOR: "What color is the rash?" (Note: It is difficult to assess rash color in people with darker-colored skin. When this situation occurs, simply ask the caller to describe what they see.)     red 5. ONSET: "When did the rash begin?"     *No Answer* 6. FEVER: "Do you have a fever?" If Yes, ask: "What is your temperature, how was it measured, and when did it start?"     *No Answer* 7. ITCHING: "Does the rash itch?" If Yes, ask: "How bad is the itch?" (Scale 1-10; or mild, moderate, severe)     Yes very bad 8. CAUSE: "What do you think is causing the rash?"     I'm not sure.    I saw Mikey Kirschner for this and she gave me antibiotic.    I was also seen at the walk in clinic for this skin issue too.  9. NEW MEDICATION: "What new medication are you taking?" (e.g., name of antibiotic) "When did you start taking this medication?".      10. OTHER SYMPTOMS: "Do you have any other symptoms?" (e.g., sore throat, fever, joint pain)       *No Answer* 11. PREGNANCY: "Is there any chance you are pregnant?" "When was your last menstrual period?"       *No Answer*  Protocols used: Rash - Widespread On Drugs-A-AH

## 2021-03-20 NOTE — Assessment & Plan Note (Signed)
Seen over tailbone Dry, flaky nature

## 2021-03-20 NOTE — Assessment & Plan Note (Signed)
Skin folds -axilla -groins -below breasts -naval -L shin s/p scratch

## 2021-03-20 NOTE — Progress Notes (Addendum)
Established patient visit   Patient: Tamara Hoffman   DOB: [birth year]   56 y.o. Female  MRN: [id number] Visit Date: 03/20/2021  Today's healthcare provider: Gwyneth Sprout, FNP   Chief Complaint  Patient presents with   Rash   Subjective    Rash This is a recurrent problem. The current episode started more than 1 month ago. The affected locations include the groin, left upper leg, face, right upper leg, right axilla, left axilla and torso (under both breast). The rash is characterized by redness, peeling, itchiness, blistering and pain. She was exposed to a new medication. Associated symptoms include diarrhea. Pertinent negatives include no congestion, cough, eye pain, fatigue, fever, joint pain, nail changes, shortness of breath, sore throat or vomiting. Treatments tried: Diflucan , Keflex. The treatment provided no relief.     Medications: Outpatient Medications Prior to Visit  Medication Sig   albuterol (VENTOLIN HFA) 108 (90 Base) MCG/ACT inhaler Inhale 2 puffs into the lungs every 6 (six) hours as needed for wheezing or shortness of breath.   amLODipine (NORVASC) 10 MG tablet Take 1 tablet (10 mg total) by mouth daily.   nystatin ointment (MYCOSTATIN) Apply 1 application topically 2 (two) times daily.   traZODone (DESYREL) 50 MG tablet Take 0.5-1 tablets (25-50 mg total) by mouth at bedtime as needed for sleep.   fluconazole (DIFLUCAN) 150 MG tablet Take 150 mg by mouth once a week. X 4 weeks (Patient not taking: Reported on 03/20/2021)   No facility-administered medications prior to visit.    Review of Systems  Constitutional:  Positive for appetite change. Negative for fatigue and fever.  HENT:  Negative for congestion and sore throat.   Eyes:  Negative for pain.  Respiratory:  Negative for cough and shortness of breath.   Gastrointestinal:  Positive for diarrhea. Negative for vomiting.  Musculoskeletal:  Negative for joint pain.  Skin:  Positive for rash. Negative for  nail changes.      Objective    BP (!) 156/108    Pulse (!) 103    Resp 16    Wt 199 lb (90.3 kg)    LMP  (LMP Unknown)    BMI 34.16 kg/m    Physical Exam Vitals and nursing note reviewed.  Constitutional:      General: She is not in acute distress.    Appearance: Normal appearance. She is obese. She is not ill-appearing, toxic-appearing or diaphoretic.  HENT:     Head: Normocephalic and atraumatic.  Cardiovascular:     Rate and Rhythm: Regular rhythm. Tachycardia present.     Pulses: Normal pulses.     Heart sounds: Normal heart sounds. No murmur heard.   No friction rub. No gallop.  Pulmonary:     Effort: Pulmonary effort is normal. No respiratory distress.     Breath sounds: Normal breath sounds. No stridor. No wheezing, rhonchi or rales.  Chest:     Chest wall: No tenderness.  Abdominal:     General: Bowel sounds are normal.     Palpations: Abdomen is soft.  Musculoskeletal:        General: No swelling, tenderness, deformity or signs of injury. Normal range of motion.     Right lower leg: No edema.     Left lower leg: No edema.  Skin:    General: Skin is warm and dry.     Capillary Refill: Capillary refill takes less than 2 seconds.     Coloration: Skin  is not jaundiced or pale.     Findings: No bruising, erythema, lesion or rash.       Neurological:     General: No focal deficit present.     Mental Status: She is alert and oriented to person, place, and time. Mental status is at baseline.     Cranial Nerves: No cranial nerve deficit.     Sensory: No sensory deficit.     Motor: No weakness.     Coordination: Coordination normal.  Psychiatric:        Mood and Affect: Mood normal. Mood is not anxious.        Behavior: Behavior normal.        Thought Content: Thought content normal.        Judgment: Judgment normal.     No results found for any visits on 03/20/21.  Assessment & Plan     Problem List Items Addressed This Visit       Digestive   Functional  diarrhea - Primary    Reports 2 BMs a day Denies sick contacts Denies travel Denies bleeding Has been using imodium for travel Referral to GI if continues        Musculoskeletal and Integument   Candidiasis of skin    Missed derm appt d/t diarrhea; re-est referral today. Has improved in some areas; worse in others. No signs of weeping.      Inverse psoriasis    Skin folds -axilla -groins -below breasts -naval -L shin s/p scratch      Relevant Medications   triamcinolone ointment (KENALOG) 0.5 %   predniSONE (DELTASONE) 10 MG tablet   Other Relevant Orders   Ambulatory referral to Dermatology   Plaque psoriasis    Seen over tailbone Dry, flaky nature      Relevant Medications   triamcinolone ointment (KENALOG) 0.5 %     Other   Tobacco use disorder    Tobacco use prior to exam Likely cause of increase in BP and HR        Return if symptoms worsen or fail to improve.      [name], FNP, have reviewed all documentation for this visit. The documentation on 03/20/21 for the exam, diagnosis, procedures, and orders are all accurate and complete.  Patient seen and examined by Tally Joe,  FNP note scribed by [employer], [place], [place] [phone] (phone) [phone] (fax)  [place]

## 2021-03-20 NOTE — Assessment & Plan Note (Signed)
Tobacco use prior to exam Likely cause of increase in BP and HR

## 2021-03-20 NOTE — Telephone Encounter (Signed)
°  Chief Complaint: Rash is getting worse Symptoms: itching and spreading all over body Frequency: since 03/06/2021 when saw Alfredia Ferguson Pertinent Negatives: Patient denies pain now.   It has stopped with the medication. Disposition: [] ED /[] Urgent Care (no appt availability in office) / [x] Appointment(In office/virtual)/ []  [facility]/ [] Home Care/ [] Refused Recommended Disposition /[] [facility]/ []  Follow-up with PCP Additional Notes: I called into the office and spoke with .   She got scheduled for today at 2:40.

## 2021-03-24 ENCOUNTER — Ambulatory Visit: Payer: 59 | Admitting: Dermatology

## 2021-03-24 ENCOUNTER — Other Ambulatory Visit: Payer: Self-pay

## 2021-03-24 DIAGNOSIS — Z840 Family history of diseases of the skin and subcutaneous tissue: Secondary | ICD-10-CM | POA: Diagnosis not present

## 2021-03-24 DIAGNOSIS — L408 Other psoriasis: Secondary | ICD-10-CM | POA: Diagnosis not present

## 2021-03-24 DIAGNOSIS — L409 Psoriasis, unspecified: Secondary | ICD-10-CM

## 2021-03-24 MED ORDER — VTAMA 1 % EX CREA: 1.0000 "application " | TOPICAL_CREAM | Freq: Every day | CUTANEOUS | 2 refills | Status: DC

## 2021-03-24 NOTE — Progress Notes (Signed)
° °  New Patient Visit  Subjective  Tamara Hoffman is a 56 y.o. female who presents for the following: Rash (Of inframammary, groin, sacral area and underarms - Triamcinolone cream is helping - has been using less than a week. She does have positive family history of psoriasis).  The following portions of the chart were reviewed this encounter and updated as appropriate:   Tobacco   Allergies   Meds   Problems   Med Hx   Surg Hx   Fam Hx      Review of Systems:  No other skin or systemic complaints except as noted in HPI or Assessment and Plan.  Objective  Well appearing patient in no apparent distress; mood and affect are within normal limits.  A focused examination was performed including trunk, extremities. Relevant physical exam findings are noted in the Assessment and Plan.  Pink patchiness of groin, axillary areas and inframammary areas. Guttate plaques of legs. Plaques of buttocks. BSA - 15%            Assessment & Plan  Psoriasis -extensive with BSA of approximately 15%  Psoriasis with Inverse Psoriasis  Psoriasis is a chronic non-curable, but treatable genetic/hereditary disease that may have other systemic features affecting other organ systems such as joints (Psoriatic Arthritis). It is associated with an increased risk of inflammatory bowel disease, heart disease, non-alcoholic fatty liver disease, and depression.    Continue TMC 0.1% cream bid x 10 more days then switch to Vtama cream qd  May consider Otezla on follow up  Tapinarof (VTAMA) 1 % CREA Apply 1 application topically daily.   Return for 6-8 weeks , Psoriasis.  I, Joanie Coddington, CMA, am acting as scribe for Armida Sans, MD . Documentation: I have reviewed the above documentation for accuracy and completeness, and I agree with the above.  Armida Sans, MD

## 2021-03-24 NOTE — Patient Instructions (Signed)

## 2021-03-29 ENCOUNTER — Encounter: Payer: Self-pay | Admitting: Dermatology

## 2021-04-14 ENCOUNTER — Ambulatory Visit: Payer: Self-pay | Admitting: Dermatology

## 2021-04-18 ENCOUNTER — Other Ambulatory Visit: Payer: Self-pay | Admitting: Physician Assistant

## 2021-04-18 DIAGNOSIS — G479 Sleep disorder, unspecified: Secondary | ICD-10-CM

## 2021-04-22 ENCOUNTER — Telehealth: Payer: Self-pay

## 2021-04-22 NOTE — Telephone Encounter (Signed)
Called patient to reschedule March 13th appointment. Patient would like to cancel appointment. She was not happy with previous experience. She states she also received her bill and unsure how she was charged $700 for the office visit when the provider didn't even touch her body. Patient wants documented that Dr. Nehemiah Massed does NOT have her permission to use her photos for anything.

## 2021-05-12 ENCOUNTER — Ambulatory Visit: Payer: 59 | Admitting: Dermatology

## 2021-06-18 ENCOUNTER — Other Ambulatory Visit: Payer: Self-pay | Admitting: Physician Assistant

## 2021-06-18 ENCOUNTER — Ambulatory Visit (INDEPENDENT_AMBULATORY_CARE_PROVIDER_SITE_OTHER): Payer: 59 | Admitting: Family Medicine

## 2021-06-18 ENCOUNTER — Encounter: Payer: Self-pay | Admitting: Family Medicine

## 2021-06-18 VITALS — BP 174/122 | HR 88 | Temp 98.0°F | Resp 16 | Wt 206.0 lb

## 2021-06-18 DIAGNOSIS — R0789 Other chest pain: Secondary | ICD-10-CM

## 2021-06-18 DIAGNOSIS — F1010 Alcohol abuse, uncomplicated: Secondary | ICD-10-CM

## 2021-06-18 DIAGNOSIS — F411 Generalized anxiety disorder: Secondary | ICD-10-CM

## 2021-06-18 DIAGNOSIS — M25541 Pain in joints of right hand: Secondary | ICD-10-CM

## 2021-06-18 DIAGNOSIS — M25542 Pain in joints of left hand: Secondary | ICD-10-CM

## 2021-06-18 DIAGNOSIS — I10 Essential (primary) hypertension: Secondary | ICD-10-CM | POA: Diagnosis not present

## 2021-06-18 MED ORDER — VALSARTAN 40 MG PO TABS: 40.0000 mg | ORAL_TABLET | Freq: Every day | ORAL | 1 refills | Status: DC

## 2021-06-18 MED ORDER — PANTOPRAZOLE SODIUM 40 MG PO TBEC: 40.0000 mg | DELAYED_RELEASE_TABLET | Freq: Every day | ORAL | 5 refills | Status: DC

## 2021-06-18 MED ORDER — BUSPIRONE HCL 5 MG PO TABS: ORAL_TABLET | ORAL | 1 refills | Status: DC

## 2021-06-18 NOTE — Patient Instructions (Signed)
.   Please review the attached list of medications and notify my office if there are any errors.   . Please bring all of your medications to every appointment so we can make sure that our medication list is the same as yours.   

## 2021-06-18 NOTE — Progress Notes (Signed)
?  ? ? ?Established patient visit ? ? ?Patient: Tamara Hoffman   DOB: 29-Sep-1965   56 y.o. Female  MRN: [id number] ?Visit Date: 06/18/2021 ? ?Today's healthcare provider: Lelon Huh, MD  ? ? ?Subjective  ?  ?HPI  ? ?Patient is here to follow up on hypertension with long history of HTN  on several medications in the past including amlodipine, carvedilol, metoprolol, carvedilol and losartan. She states she feels strange when she take BP medications. At one point she made drastic improvement in her lifestyle and feels like her BP was controlled without medications. However she has had increasing levels or stress and anxiety the last several months, has started drinking alcohol regularly, is not sleeping well, and is smoking more. She was prescribed trazodone at her last visit to help sleep. She states it helped sleep for a few days, but did feel a little more chilled out during the day. However she has not taken it at all for several weeks.  ? ?Medications: ?Outpatient Medications Prior to Visit  ?Medication Sig  ? albuterol (VENTOLIN HFA) 108 (90 Base) MCG/ACT inhaler Inhale 2 puffs into the lungs every 6 (six) hours as needed for wheezing or shortness of breath.  ? amLODipine (NORVASC) 10 MG tablet Take 1 tablet (10 mg total) by mouth daily. (Patient not taking: Reported on 06/18/2021)  ? clobetasol (TEMOVATE) 0.05 % external solution Apply 1 application topically 2 (two) times daily. (Patient not taking: Reported on 06/18/2021)  ? fluconazole (DIFLUCAN) 150 MG tablet Take 150 mg by mouth once a week. X 4 weeks (Patient not taking: Reported on 03/20/2021)  ? nystatin ointment (MYCOSTATIN) Apply 1 application topically 2 (two) times daily. (Patient not taking: Reported on 06/18/2021)  ? predniSONE (DELTASONE) 10 MG tablet Take 1 tablet (10 mg total) by mouth daily with breakfast. (Patient not taking: Reported on 06/18/2021)  ? Tapinarof (VTAMA) 1 % CREA Apply 1 application topically daily. (Patient not taking: Reported on  06/18/2021)  ? traZODone (DESYREL) 50 MG tablet TAKE 1/2 TO 1 TABLET(25 TO 50 MG) BY MOUTH AT BEDTIME AS NEEDED FOR SLEEP (Patient not taking: Reported on 06/18/2021)  ? triamcinolone ointment (KENALOG) 0.5 % Apply 1 application topically 2 (two) times daily. (Patient not taking: Reported on 06/18/2021)  ? ?No facility-administered medications prior to visit.  ? ? ?Last metabolic panel ?Lab Results  ?Component Value Date  ? GLUCOSE 107 (H) 03/06/2021  ? NA 137 03/06/2021  ? K 4.0 03/06/2021  ? CL 98 03/06/2021  ? CO2 20 03/06/2021  ? BUN 12 03/06/2021  ? CREATININE 1.10 (H) 03/06/2021  ? EGFR 60 03/06/2021  ? CALCIUM 9.5 03/06/2021  ? PROT 7.9 03/06/2021  ? ALBUMIN 4.4 03/06/2021  ? LABGLOB 3.5 03/06/2021  ? AGRATIO 1.3 03/06/2021  ? BILITOT 0.3 03/06/2021  ? ALKPHOS 138 (H) 03/06/2021  ? AST 17 03/06/2021  ? ALT 13 03/06/2021  ? ANIONGAP 10 10/26/2018  ? ?  Objective  ?  ?BP (!) 174/122 (BP Location: Right Arm, Patient Position: Sitting, Cuff Size: Normal)   Pulse 88   Temp 98 ?F (36.7 ?C) (Oral)   Resp 16   Wt 206 lb (93.4 kg)   LMP  (LMP Unknown)   SpO2 99%   BMI 35.36 kg/m?  ? ? ?Physical Exam  ? ?General appearance: Mildly obese female, cooperative and in no acute distress ?Head: Normocephalic, without obvious abnormality, atraumatic ?Respiratory: Respirations even and unlabored, normal respiratory rate ?Extremities: All extremities are intact.  ?Skin:  Skin color, texture, turgor normal. No rashes seen  ?Psych: Appropriate mood and affect. ?Neurologic: Mental status: Alert, oriented to person, place, and time, thought content appropriate. Slightly anxious ? ? Assessment & Plan  ?  ? ?1. Primary hypertension ?Likely exacerbated by excessive alcohol consumption, anxiety, poor sleep and smoking. She states she is very sensitive to medications and did not feel well on several previous antihypertensives as per HPI. Will start low dose of valsartan (DIOVAN) 40 MG tablet; Take 1 tablet (40 mg total) by mouth  daily.  Dispense: 30 tablet; Refill: 1 ? ?Is to work on reducing nicotine and alcohol intake and work on Sports administrator.  ?Is due for CPE which will schedule with Mikey Kirschner, PA-C in 1 month.  ? ?2. Generalized anxiety disorder ?try- busPIRone (BUSPAR) 5 MG tablet; 1/2 tablet twice a day for 6 days, then 1 tablet twice a day for six days, then 1 in the am and 2 at night for six days, then 2 tablets twice a day  Dispense: 60 tablet; Refill: 1 ? ?Advised this is very low dose and not to be surprised if needs further up titration.  ? ?3. Arthralgia of both hands ? ?- Ambulatory referral to Rheumatology  ? ?4. Excessive drinking alcohol ?Discussed medications to reduce cravings such as naltrexone and Campral. However this seems to be primarily driven by untreated anxiety and insomnia. See how she does with buspirone as above.    ? ?Future Appointments  ?Date Time Provider [place]  ?07/21/2021  9:00 AM Mikey Kirschner, PA-C [facility]  ?  ?   ? ?The entirety of the information documented in the History of Present Illness, Review of Systems and Physical Exam were personally obtained by me. Portions of this information were initially documented by the CMA and reviewed by me for thoroughness and accuracy.   ? ? ?Lelon Huh, MD  ?[facility] ?[phone] (phone) ?[phone] (fax) ? ? Medical Group ? ?

## 2021-06-18 NOTE — Telephone Encounter (Signed)
Requested medication (s) are due for refill today: no ? ?Requested medication (s) are on the active medication list: no ? ?Last refill:  not on list, last orderede by Dr Natale Milch ? ?Future visit scheduled: yes, seen today and scheduled 07/21/21. ? ?Notes to clinic:  Please assess. ? ? ?  ? ?Requested Prescriptions  ?Pending Prescriptions Disp Refills  ? pantoprazole (PROTONIX) 40 MG tablet 30 tablet 3  ?  Sig: Take 1 tablet (40 mg total) by mouth daily.  ?  ? Gastroenterology: Proton Pump Inhibitors Passed - 06/18/2021 12:16 PM  ?  ?  Passed - Valid encounter within last 12 months  ?  Recent Outpatient Visits   ? ?      ? Today Primary hypertension  ? [facility] Birdie Sons, MD  ? 3 months ago Functional diarrhea  ? [facility] Tally Joe T, FNP  ? 3 months ago Candidiasis of skin  ? [facility] Thedore Mins, Ria Comment, PA-C  ? 2 years ago Cystitis with hematuria  ? [place], FNP  ? 2 years ago Bronchitis  ? [place], PA-C  ? ?  ?  ?Future Appointments   ? ?        ? In 1 month [place], [facility], [facility]  ? ?  ? ? ?  ?  ?  ? ? ?

## 2021-06-18 NOTE — Telephone Encounter (Signed)
Medication Refill - Medication: pantoprazole (PROTONIX) EC tablet 40 mg    ? ? ?Has the patient contacted their pharmacy? No. ?(Agent: If no, request that the patient contact the pharmacy for the refill. If patient does not wish to contact the pharmacy document the reason why and proceed with request.) ?(Agent: If yes, when and what did the pharmacy advise?) ? ?Preferred Pharmacy (with phone number or street name): [facility] [id number] [address]ina [address]on, [place]  ?[address] [place], [place] [address]  ?Phone:  [phone]  Fax:  [phone] ?Has the patient been seen for an appointment in the last year OR does the patient have an upcoming appointment? Yes.  Pt was seen today  ? ?Agent: Please be advised that RX refills may take up to 3 business days. We ask that you follow-up with your pharmacy. ?

## 2021-07-13 ENCOUNTER — Other Ambulatory Visit: Payer: Self-pay | Admitting: Family Medicine

## 2021-07-13 DIAGNOSIS — I10 Essential (primary) hypertension: Secondary | ICD-10-CM

## 2021-07-16 ENCOUNTER — Ambulatory Visit: Payer: BLUE CROSS/BLUE SHIELD | Admitting: Obstetrics and Gynecology

## 2021-07-21 ENCOUNTER — Ambulatory Visit (INDEPENDENT_AMBULATORY_CARE_PROVIDER_SITE_OTHER): Payer: 59 | Admitting: Physician Assistant

## 2021-07-21 ENCOUNTER — Encounter: Payer: Self-pay | Admitting: Physician Assistant

## 2021-07-21 VITALS — BP 169/81 | HR 95 | Ht 64.0 in | Wt 201.2 lb

## 2021-07-21 DIAGNOSIS — Z0000 Encounter for general adult medical examination without abnormal findings: Secondary | ICD-10-CM

## 2021-07-21 DIAGNOSIS — J441 Chronic obstructive pulmonary disease with (acute) exacerbation: Secondary | ICD-10-CM

## 2021-07-21 DIAGNOSIS — R739 Hyperglycemia, unspecified: Secondary | ICD-10-CM | POA: Diagnosis not present

## 2021-07-21 DIAGNOSIS — I10 Essential (primary) hypertension: Secondary | ICD-10-CM

## 2021-07-21 DIAGNOSIS — Z1159 Encounter for screening for other viral diseases: Secondary | ICD-10-CM

## 2021-07-21 DIAGNOSIS — Z1231 Encounter for screening mammogram for malignant neoplasm of breast: Secondary | ICD-10-CM

## 2021-07-21 DIAGNOSIS — Z1211 Encounter for screening for malignant neoplasm of colon: Secondary | ICD-10-CM

## 2021-07-21 NOTE — Patient Instructions (Signed)
Please contact [phone] to schedule your mammogram. They will ask which location you prefer to be seen in. You have two options listed below.  1) [facility] located at [address] [place], [place] [address] 2) [facility] [place] located at [address] [place], [place] [address]  Please feel free to contact us if you have any further questions or concerns  

## 2021-07-21 NOTE — Progress Notes (Deleted)
I,Sha'taria Zanna Hawn,acting as a Neurosurgeon for [employer], PA-C.,have documented all relevant documentation on the behalf of Alfredia Ferguson, PA-C,as directed by  Alfredia Ferguson, PA-C while in the presence of Alfredia Ferguson, PA-C.   Complete physical exam   Patient: Tamara Hoffman   DOB: Feb 17, 1966   56 y.o. Female  MRN: [id number] Visit Date: 07/21/2021  Today's healthcare provider: Alfredia Ferguson, PA-C   No chief complaint on file.  Subjective    Siarra L Hoffman is a 56 y.o. female who presents today for a complete physical exam.  She reports consuming a general diet.  The patient reports going walking at least 2 miles every morning 7 days a week.  She generally feels well. She reports sleeping fairly well. She does have additional problems to discuss today.  HPI  Would like to receive tetanus and shingles vaccine today Would like to know a good diet plan that she could follow to help with her losing weight.  Lab work with rheumatology last week Would like to have mammogram and hold off on gastro until she gets gyn stuff handled  -ok with considering tobacco counseling  Past Medical History:  Diagnosis Date   GERD (gastroesophageal reflux disease)    Hypertension    Past Surgical History:  Procedure Laterality Date   LEEP     Social History   Socioeconomic History   Marital status: Married    Spouse name: Not on file   Number of children: Not on file   Years of education: Not on file   Highest education level: Not on file  Occupational History   Not on file  Tobacco Use   Smoking status: Every Day    Packs/day: 1.00    Years: 22.00    Pack years: 22.00    Types: Cigarettes   Smokeless tobacco: Never  Vaping Use   Vaping Use: Some days  Substance and Sexual Activity   Alcohol use: Yes    Alcohol/week: 5.0 - 7.0 standard drinks    Types: 5 - 7 Cans of beer per week    Comment: now is almost every day/occasional   Drug use: Not Currently    Types: Marijuana     Comment: Last used 1 week ago.   Sexual activity: Not on file  Other Topics Concern   Not on file  Social History Narrative   Independent at baseline. Son lives with her.   Social Determinants of Health   Financial Resource Strain: Not on file  Food Insecurity: Not on file  Transportation Needs: Not on file  Physical Activity: Not on file  Stress: Not on file  Social Connections: Not on file  Intimate Partner Violence: Not on file   Family Status  Relation Name Status   Mother  Deceased   Father  Alive   Sister  Alive   [place]  (Not Specified)   Sister  (Not Specified)   Family History  Problem Relation Age of Onset   Diabetes Mother    Heart Problems Mother    Heart failure Mother    Hypertension Father    Heart Problems Father    Coronary artery disease Father    Diabetes Sister    Heart murmur Sister    Cancer Maternal Grandmother    Heart murmur Sister    Allergies  Allergen Reactions   Promethazine Hcl Swelling    Patient Care Team: Alfredia Ferguson, PA-C as PCP - General (Physician Assistant)   Medications: Outpatient Medications  Prior to Visit  Medication Sig   albuterol (VENTOLIN HFA) 108 (90 Base) MCG/ACT inhaler Inhale 2 puffs into the lungs every 6 (six) hours as needed for wheezing or shortness of breath.   busPIRone (BUSPAR) 5 MG tablet 1/2 tablet twice a day for 6 days, then 1 tablet twice a day for six days, then 1 in the am and 2 at night for six days, then 2 tablets twice a day   clobetasol (TEMOVATE) 0.05 % external solution Apply 1 application topically 2 (two) times daily. (Patient not taking: Reported on 06/18/2021)   pantoprazole (PROTONIX) 40 MG tablet Take 1 tablet (40 mg total) by mouth daily.   Tapinarof (VTAMA) 1 % CREA Apply 1 application topically daily. (Patient not taking: Reported on 06/18/2021)   traZODone (DESYREL) 50 MG tablet TAKE 1/2 TO 1 TABLET(25 TO 50 MG) BY MOUTH AT BEDTIME AS NEEDED FOR SLEEP (Patient not taking: Reported on  06/18/2021)   triamcinolone ointment (KENALOG) 0.5 % Apply 1 application topically 2 (two) times daily. (Patient not taking: Reported on 06/18/2021)   valsartan (DIOVAN) 40 MG tablet TAKE 1 TABLET(40 MG) BY MOUTH DAILY   No facility-administered medications prior to visit.    Review of Systems  {Labs  Heme  Chem  Endocrine  Serology  Results Review (optional):23779}  Objective    LMP  (LMP Unknown)  {Show previous vital signs (optional):23777}   Physical Exam  ***  Last depression screening scores    06/18/2021   10:32 AM 03/20/2021    3:09 PM 10/03/2018    4:23 PM  PHQ 2/9 Scores  PHQ - 2 Score 3 3 6   PHQ- 9 Score 9 11 19    Last fall risk screening    06/18/2021   10:32 AM  Fall Risk   Falls in the past year? 0  Number falls in past yr: 0  Injury with Fall? 0  Follow up Falls evaluation completed   Last Audit-C alcohol use screening    06/18/2021   10:36 AM  Alcohol Use Disorder Test (AUDIT)  1. How often do you have a drink containing alcohol? 3  2. How many drinks containing alcohol do you have on a typical day when you are drinking? 0  3. How often do you have six or more drinks on one occasion? 3  AUDIT-C Score 6   A score of 3 or more in women, and 4 or more in men indicates increased risk for alcohol abuse, EXCEPT if all of the points are from question 1   No results found for any visits on 07/21/21.  Assessment & Plan    Routine Health Maintenance and Physical Exam  Exercise Activities and Dietary recommendations  Goals      Quit Smoking        Immunization History  Administered Date(s) Administered   PFIZER(Purple Top)SARS-COV-2 Vaccination 07/08/2019, 08/04/2019, 02/10/2020   Pneumococcal Polysaccharide-23 04/24/2019    Health Maintenance  Topic Date Due   Hepatitis C Screening  Never done   TETANUS/TDAP  Never done   Zoster Vaccines- Shingrix (1 of 2) Never done   COLONOSCOPY (Pts 45-6461yrs Insurance coverage will need to be confirmed)   Never done   MAMMOGRAM  Never done   COVID-19 Vaccine (4 - Booster for Pfizer series) 04/06/2020   INFLUENZA VACCINE  09/30/2021   PAP SMEAR-Modifier  05/15/2024   HIV Screening  Completed   HPV VACCINES  Aged Out    Discussed health benefits of physical activity,  and encouraged her to engage in regular exercise appropriate for her age and condition.  ***  No follow-ups on file.     {provider attestation***:1}   Alfredia Ferguson, PA-C  [facility] [phone] (phone) [phone] (fax)  [facility]

## 2021-07-21 NOTE — Assessment & Plan Note (Signed)
Still uncontrolled w/ valsartan. As she currently feels unwell w/ copd exacerbation, will recheck in 4 weeks.

## 2021-07-21 NOTE — Progress Notes (Signed)
I,Sha'taria Tyson,acting as a Neurosurgeon for [employer], PA-C.,have documented all relevant documentation on the behalf of Alfredia Ferguson, PA-C,as directed by  Alfredia Ferguson, PA-C while in the presence of Alfredia Ferguson, PA-C.   Complete physical exam   Patient: Tamara Hoffman   DOB: 26-Aug-1965   56 y.o. Female  MRN: [id number] Visit Date: 07/21/2021  Today's healthcare provider: Alfredia Ferguson, PA-C   Cc. Cpe   Subjective    Tamara Hoffman is a 56 y.o. female who presents today for a complete physical exam.  She reports consuming a general diet.  The patient reports going walking at least 2 miles every morning 7 days a week.  She generally feels well. She reports sleeping fairly well. She does have additional problems to discuss today.  Saw rheumatology for psoriatic arthritis. They started her on MTX and folic acid. Pt took one dose, felt very drained and did not continue. Has not had her follow up since.  She reports increase in SOB, cough for the last week, saw urgent care yesterday, neg viral panel, cxr, given zpak and albuterol nebulizer.   Past Medical History:  Diagnosis Date   GERD (gastroesophageal reflux disease)    Hypertension    Past Surgical History:  Procedure Laterality Date   LEEP     Social History   Socioeconomic History   Marital status: Married    Spouse name: Not on file   Number of children: Not on file   Years of education: Not on file   Highest education level: Not on file  Occupational History   Not on file  Tobacco Use   Smoking status: Every Day    Packs/day: 1.00    Years: 22.00    Pack years: 22.00    Types: Cigarettes   Smokeless tobacco: Never  Vaping Use   Vaping Use: Some days  Substance and Sexual Activity   Alcohol use: Yes    Alcohol/week: 5.0 - 7.0 standard drinks    Types: 5 - 7 Cans of beer per week    Comment: now is almost every day/occasional   Drug use: Not Currently    Types: Marijuana    Comment: Last used 1  week ago.   Sexual activity: Not on file  Other Topics Concern   Not on file  Social History Narrative   Independent at baseline. Son lives with her.   Social Determinants of Health   Financial Resource Strain: Not on file  Food Insecurity: Not on file  Transportation Needs: Not on file  Physical Activity: Not on file  Stress: Not on file  Social Connections: Not on file  Intimate Partner Violence: Not on file   Family Status  Relation Name Status   Mother  Deceased   Father  Alive   Sister  Alive   [place]  (Not Specified)   Sister  (Not Specified)   Family History  Problem Relation Age of Onset   Diabetes Mother    Heart Problems Mother    Heart failure Mother    Hypertension Father    Heart Problems Father    Coronary artery disease Father    Diabetes Sister    Heart murmur Sister    Cancer Maternal Grandmother    Heart murmur Sister    Allergies  Allergen Reactions   Promethazine Swelling   Promethazine Hcl Swelling    Patient Care Team: Alfredia Ferguson, PA-C as PCP - General (Physician Assistant)   Medications: Outpatient Medications  Prior to Visit  Medication Sig   albuterol (VENTOLIN HFA) 108 (90 Base) MCG/ACT inhaler Inhale 2 puffs into the lungs every 6 (six) hours as needed for wheezing or shortness of breath.   azithromycin (ZITHROMAX) 250 MG tablet Take 2 tablets PO on Day 1 and then 1 tablet PO on Day 2-5.   benzonatate (TESSALON) 200 MG capsule Take by mouth.   busPIRone (BUSPAR) 5 MG tablet 1/2 tablet twice a day for 6 days, then 1 tablet twice a day for six days, then 1 in the am and 2 at night for six days, then 2 tablets twice a day   celecoxib (CELEBREX) 100 MG capsule Take 100 mg by mouth 2 (two) times daily.   clobetasol (TEMOVATE) 0.05 % external solution Apply 1 application topically 2 (two) times daily.   pantoprazole (PROTONIX) 40 MG tablet Take 1 tablet (40 mg total) by mouth daily.   predniSONE (DELTASONE) 20 MG tablet Take by mouth.    Tapinarof (VTAMA) 1 % CREA Apply 1 application topically daily.   triamcinolone ointment (KENALOG) 0.5 % Apply 1 application topically 2 (two) times daily.   valsartan (DIOVAN) 40 MG tablet TAKE 1 TABLET(40 MG) BY MOUTH DAILY   folic acid (FOLVITE) 1 MG tablet Take 1 mg by mouth daily.   [DISCONTINUED] methotrexate (RHEUMATREX) 2.5 MG tablet Take 15 mg by mouth once a week.   [DISCONTINUED] traZODone (DESYREL) 50 MG tablet TAKE 1/2 TO 1 TABLET(25 TO 50 MG) BY MOUTH AT BEDTIME AS NEEDED FOR SLEEP (Patient not taking: Reported on 07/21/2021)   No facility-administered medications prior to visit.    Review of Systems Review of Systems  Constitutional:  Negative for malaise/fatigue.  Respiratory:  Positive for cough and wheezing.   Psychiatric/Behavioral:  Suicidal ideas: .attld.      Objective     BP (!) 169/81   Pulse 95   Ht 5\' 4"  (1.626 m)   Wt 201 lb 3.2 oz (91.3 kg)   LMP  (LMP Unknown)   SpO2 93%   BMI 34.54 kg/m     Physical Exam  Physical Exam Constitutional:      Appearance: Normal appearance.  HENT:     Head: Normocephalic.     Right Ear: Tympanic membrane normal.     Left Ear: Tympanic membrane normal.     Nose: No congestion.     Mouth/Throat:     Pharynx: Posterior oropharyngeal erythema present. No oropharyngeal exudate.  Eyes:     Pupils: Pupils are equal, round, and reactive to light.  Cardiovascular:     Rate and Rhythm: Normal rate and regular rhythm.  Pulmonary:     Effort: Pulmonary effort is normal.     Breath sounds: Wheezing present.  Abdominal:     Palpations: Abdomen is soft.     Tenderness: There is no abdominal tenderness.  Musculoskeletal:     Right lower leg: No edema.     Left lower leg: No edema.  Lymphadenopathy:     Cervical: No cervical adenopathy.  Neurological:     General: No focal deficit present.     Mental Status: She is oriented to person, place, and time.  Psychiatric:        Mood and Affect: Mood normal.         Behavior: Behavior normal.     Last depression screening scores    07/21/2021    9:41 AM 06/18/2021   10:32 AM 03/20/2021    3:09 PM  PHQ  2/9 Scores  PHQ - 2 Score 2 3 3   PHQ- 9 Score 9 9 11    Last fall risk screening    07/21/2021    9:41 AM  Fall Risk   Falls in the past year? 0  Number falls in past yr: 0  Injury with Fall? 0  Risk for fall due to : No Fall Risks   Last Audit-C alcohol use screening    07/21/2021    9:41 AM  Alcohol Use Disorder Test (AUDIT)  1. How often do you have a drink containing alcohol? 3  2. How many drinks containing alcohol do you have on a typical day when you are drinking? 1  3. How often do you have six or more drinks on one occasion? 1  AUDIT-C Score 5   A score of 3 or more in women, and 4 or more in men indicates increased risk for alcohol abuse, EXCEPT if all of the points are from question 1   No results found for any visits on 07/21/21.  Assessment & Plan    Routine Health Maintenance and Physical Exam  Exercise Activities and Dietary recommendations  Goals      Quit Smoking       Immunization History  Administered Date(s) Administered   PFIZER(Purple Top)SARS-COV-2 Vaccination 07/08/2019, 08/04/2019, 02/10/2020   Pneumococcal Polysaccharide-23 04/24/2019    Health Maintenance  Topic Date Due   Hepatitis C Screening  Never done   TETANUS/TDAP  Never done   Zoster Vaccines- Shingrix (1 of 2) Never done   COLONOSCOPY (Pts 45-36yrs Insurance coverage will need to be confirmed)  Never done   MAMMOGRAM  Never done   COVID-19 Vaccine (4 - Booster for Pfizer series) 04/06/2020   INFLUENZA VACCINE  09/30/2021   PAP SMEAR-Modifier  05/15/2024   HIV Screening  Completed   HPV VACCINES  Aged Out    Discussed health benefits of physical activity, and encouraged her to engage in regular exercise appropriate for her age and condition.  Problem List Items Addressed This Visit       Cardiovascular and Mediastinum   Primary  hypertension    Still uncontrolled w/ valsartan. As she currently feels unwell w/ copd exacerbation, will recheck in 4 weeks.       Relevant Orders   Comprehensive Metabolic Panel (CMET)     Respiratory   COPD exacerbation (HCC)    Already being treated w/ albuterol and zpac by urgent care.         Relevant Medications   azithromycin (ZITHROMAX) 250 MG tablet   benzonatate (TESSALON) 200 MG capsule   predniSONE (DELTASONE) 20 MG tablet   Other Visit Diagnoses     Encounter for health maintenance examination    -  Primary   Relevant Orders   Comprehensive Metabolic Panel (CMET)   Hyperglycemia       Relevant Orders   HgB A1c   Screening mammogram for breast cancer       Relevant Orders   MM 3D SCREEN BREAST BILATERAL   Colon cancer screening       Relevant Orders   Ambulatory referral to Gastroenterology   Encounter for hepatitis C screening test for low risk patient       Relevant Orders   Hepatitis C Antibody      Not recommending shingrix or tdap today d/t current COPD exacerbation. If not on a biologic/mtx at next visit will give shingrix. Advised she talk to her rheum about vaccines as  well.  Return in about 4 weeks (around 08/18/2021) for hypertension.     I, Alfredia Ferguson, PA-C have reviewed all documentation for this visit. The documentation on  07/21/2021  for the exam, diagnosis, procedures, and orders are all accurate and complete.  Alfredia Ferguson, PA-C [facility] [address] [place], [place], [address] Office: [phone] Fax: [phone]   [facility]

## 2021-07-21 NOTE — Assessment & Plan Note (Addendum)
Already being treated w/ albuterol, steriod and zpac by urgent care.

## 2021-07-22 LAB — COMPREHENSIVE METABOLIC PANEL
ALT: 16 IU/L (ref 0–32)
AST: 20 IU/L (ref 0–40)
Albumin/Globulin Ratio: 1.5 (ref 1.2–2.2)
Albumin: 4.8 g/dL (ref 3.8–4.9)
Alkaline Phosphatase: 140 IU/L — ABNORMAL HIGH (ref 44–121)
BUN/Creatinine Ratio: 18 (ref 9–23)
BUN: 17 mg/dL (ref 6–24)
Bilirubin Total: <0.2 mg/dL (ref 0.0–1.2)
CO2: 19 mmol/L — ABNORMAL LOW (ref 20–29)
Calcium: 9.8 mg/dL (ref 8.7–10.2)
Chloride: 101 mmol/L (ref 96–106)
Creatinine, Ser: 0.97 mg/dL (ref 0.57–1.00)
Globulin, Total: 3.3 g/dL (ref 1.5–4.5)
Glucose: 113 mg/dL — ABNORMAL HIGH (ref 70–99)
Potassium: 5.2 mmol/L (ref 3.5–5.2)
Sodium: 136 mmol/L (ref 134–144)
Total Protein: 8.1 g/dL (ref 6.0–8.5)
eGFR: 69 mL/min/{1.73_m2} (ref >59)

## 2021-07-22 LAB — HEPATITIS C ANTIBODY: Hep C Virus Ab: NONREACTIVE

## 2021-07-22 LAB — HEMOGLOBIN A1C
Est. average glucose Bld gHb Est-mCnc: 111 mg/dL
Hgb A1c MFr Bld: 5.5 % (ref 4.8–5.6)

## 2021-07-23 ENCOUNTER — Telehealth: Payer: Self-pay

## 2021-07-23 ENCOUNTER — Other Ambulatory Visit: Payer: Self-pay

## 2021-07-23 DIAGNOSIS — Z1211 Encounter for screening for malignant neoplasm of colon: Secondary | ICD-10-CM

## 2021-07-23 MED ORDER — NA SULFATE-K SULFATE-MG SULF 17.5-3.13-1.6 GM/177ML PO SOLN: 1.0000 | Freq: Once | ORAL | 0 refills | Status: AC

## 2021-07-23 NOTE — Telephone Encounter (Signed)
Gastroenterology Pre-Procedure Review  Request Date: 09/12/21 Requesting Physician: Dr. Tobi Bastos  PATIENT REVIEW QUESTIONS: The patient responded to the following health history questions as indicated:    1. Are you having any GI issues? yes (mild diarrhea since starting new meds) 2. Do you have a personal history of Polyps? no 3. Do you have a family history of Colon Cancer or Polyps? no 4. Diabetes Mellitus? no 5. Joint replacements in the past 12 months?no 6. Major health problems in the past 3 months?no 7. Any artificial heart valves, MVP, or defibrillator?no    MEDICATIONS & ALLERGIES:    Patient reports the following regarding taking any anticoagulation/antiplatelet therapy:   Plavix, Coumadin, Eliquis, Xarelto, Lovenox, Pradaxa, Brilinta, or Effient? no Aspirin? no  Patient confirms/reports the following medications:  Current Outpatient Medications  Medication Sig Dispense Refill   albuterol (VENTOLIN HFA) 108 (90 Base) MCG/ACT inhaler Inhale 2 puffs into the lungs every 6 (six) hours as needed for wheezing or shortness of breath. 8 g 2   azithromycin (ZITHROMAX) 250 MG tablet Take 2 tablets PO on Day 1 and then 1 tablet PO on Day 2-5.     benzonatate (TESSALON) 200 MG capsule Take by mouth.     busPIRone (BUSPAR) 5 MG tablet 1/2 tablet twice a day for 6 days, then 1 tablet twice a day for six days, then 1 in the am and 2 at night for six days, then 2 tablets twice a day 60 tablet 1   celecoxib (CELEBREX) 100 MG capsule Take 100 mg by mouth 2 (two) times daily.     clobetasol (TEMOVATE) 0.05 % external solution Apply 1 application topically 2 (two) times daily. 200 mL 1   folic acid (FOLVITE) 1 MG tablet Take 1 mg by mouth daily.     pantoprazole (PROTONIX) 40 MG tablet Take 1 tablet (40 mg total) by mouth daily. 30 tablet 5   predniSONE (DELTASONE) 20 MG tablet Take by mouth.     Tapinarof (VTAMA) 1 % CREA Apply 1 application topically daily. 60 g 2   triamcinolone ointment  (KENALOG) 0.5 % Apply 1 application topically 2 (two) times daily. 30 g 0   valsartan (DIOVAN) 40 MG tablet TAKE 1 TABLET(40 MG) BY MOUTH DAILY 30 tablet 0   No current facility-administered medications for this visit.    Patient confirms/reports the following allergies:  Allergies  Allergen Reactions   Promethazine Swelling   Promethazine Hcl Swelling    No orders of the defined types were placed in this encounter.   AUTHORIZATION INFORMATION Primary Insurance: 1D#: Group #:  Secondary Insurance: 1D#: Group #:  SCHEDULE INFORMATION: Date: 09/12/21 Time: Location: [facility]

## 2021-07-30 ENCOUNTER — Ambulatory Visit: Payer: BLUE CROSS/BLUE SHIELD | Admitting: Obstetrics and Gynecology

## 2021-08-12 ENCOUNTER — Other Ambulatory Visit: Payer: Self-pay | Admitting: Family Medicine

## 2021-08-12 DIAGNOSIS — I10 Essential (primary) hypertension: Secondary | ICD-10-CM

## 2021-08-18 ENCOUNTER — Ambulatory Visit: Payer: 59 | Admitting: Physician Assistant

## 2021-08-19 ENCOUNTER — Ambulatory Visit: Payer: 59 | Admitting: Physician Assistant

## 2021-08-19 NOTE — Progress Notes (Deleted)
I,Sha'taria Kiefer Opheim,acting as a Education administrator for [employer], PA-C.,have documented all relevant documentation on the behalf of Mikey Kirschner, PA-C,as directed by  Mikey Kirschner, PA-C while in the presence of Mikey Kirschner, PA-C.  Established patient visit   Patient: Tamara Hoffman   DOB: 28-Jul-1965   56 y.o. Female  MRN: [id number] Visit Date: 08/19/2021  Today's healthcare provider: Mikey Kirschner, PA-C   No chief complaint on file.  Subjective    HPI  Hypertension, follow-up  BP Readings from Last 3 Encounters:  07/21/21 (!) 169/81  06/18/21 (!) 174/122  03/20/21 (!) 156/108   Wt Readings from Last 3 Encounters:  07/21/21 201 lb 3.2 oz (91.3 kg)  06/18/21 206 lb (93.4 kg)  03/20/21 199 lb (90.3 kg)     She was last seen for hypertension 4 weeks ago.  BP at that visit was 169/81. Management since that visit includes continue current treatment.  She reports {excellent/good/fair/poor:19665} compliance with treatment. She {is/is not:9024} having side effects. {document side effects if present:1} She is following a {diet:21022986} diet. She {is/is not:9024} exercising. She {does/does not:200015} smoke.  Use of agents associated with hypertension: {bp agents assoc with hypertension:511::"none"}.   Outside blood pressures are {***enter patient reported home BP readings, or 'not being checked':1}. Symptoms: {Yes/No:20286} chest pain {Yes/No:20286} chest pressure  {Yes/No:20286} palpitations {Yes/No:20286} syncope  {Yes/No:20286} dyspnea {Yes/No:20286} orthopnea  {Yes/No:20286} paroxysmal nocturnal dyspnea {Yes/No:20286} lower extremity edema   Pertinent labs Lab Results  Component Value Date   CHOL 189 03/06/2021   HDL 85 03/06/2021   LDLCALC 88 03/06/2021   TRIG 88 03/06/2021   CHOLHDL 2.2 03/06/2021   Lab Results  Component Value Date   NA 136 07/21/2021   K 5.2 07/21/2021   CREATININE 0.97 07/21/2021   EGFR 69 07/21/2021   GLUCOSE 113 (H) 07/21/2021   TSH  2.020 10/25/2018     The 10-year ASCVD risk score (Arnett DK, et al., 2019) is: 6.4%  ---------------------------------------------------------------------------------------------------   Medications: Outpatient Medications Prior to Visit  Medication Sig   albuterol (VENTOLIN HFA) 108 (90 Base) MCG/ACT inhaler Inhale 2 puffs into the lungs every 6 (six) hours as needed for wheezing or shortness of breath.   azithromycin (ZITHROMAX) 250 MG tablet Take 2 tablets PO on Day 1 and then 1 tablet PO on Day 2-5.   busPIRone (BUSPAR) 5 MG tablet 1/2 tablet twice a day for 6 days, then 1 tablet twice a day for six days, then 1 in the am and 2 at night for six days, then 2 tablets twice a day   celecoxib (CELEBREX) 100 MG capsule Take 100 mg by mouth 2 (two) times daily.   clobetasol (TEMOVATE) 0.05 % external solution Apply 1 application topically 2 (two) times daily.   folic acid (FOLVITE) 1 MG tablet Take 1 mg by mouth daily.   pantoprazole (PROTONIX) 40 MG tablet Take 1 tablet (40 mg total) by mouth daily.   Tapinarof (VTAMA) 1 % CREA Apply 1 application topically daily.   triamcinolone ointment (KENALOG) 0.5 % Apply 1 application topically 2 (two) times daily.   valsartan (DIOVAN) 40 MG tablet TAKE 1 TABLET(40 MG) BY MOUTH DAILY   No facility-administered medications prior to visit.    Review of Systems  {Labs  Heme  Chem  Endocrine  Serology  Results Review (optional):23779}   Objective    LMP  (LMP Unknown)  {Show previous vital signs (optional):23777}  Physical Exam  ***  No results found for any visits  on 08/19/21.  Assessment & Plan     ***  No follow-ups on file.      {provider attestation***:1}   Mikey Kirschner, PA-C  [facility] [phone] (phone) [phone] (fax)  [place]

## 2021-09-11 ENCOUNTER — Telehealth: Payer: Self-pay

## 2021-09-11 ENCOUNTER — Telehealth: Payer: Self-pay | Admitting: Gastroenterology

## 2021-09-11 ENCOUNTER — Encounter: Payer: Self-pay | Admitting: Gastroenterology

## 2021-09-11 NOTE — Telephone Encounter (Signed)
Patient's call has been returned.  Her colonoscopy has been rescheduled to Friday 09/26/21. I will contact Endoscopy tomorrow morning to make them aware.

## 2021-09-11 NOTE — Telephone Encounter (Signed)
Patient needs to reschedule colonoscopy. 

## 2021-09-12 ENCOUNTER — Encounter: Payer: Self-pay | Admitting: Anesthesiology

## 2021-09-12 DIAGNOSIS — L4050 Arthropathic psoriasis, unspecified: Secondary | ICD-10-CM | POA: Insufficient documentation

## 2021-09-15 ENCOUNTER — Encounter: Payer: Self-pay | Admitting: Physician Assistant

## 2021-09-15 ENCOUNTER — Ambulatory Visit (INDEPENDENT_AMBULATORY_CARE_PROVIDER_SITE_OTHER): Payer: 59 | Admitting: Physician Assistant

## 2021-09-15 VITALS — BP 192/113 | HR 81 | Ht 64.0 in | Wt 209.3 lb

## 2021-09-15 DIAGNOSIS — F1090 Alcohol use, unspecified, uncomplicated: Secondary | ICD-10-CM

## 2021-09-15 DIAGNOSIS — I10 Essential (primary) hypertension: Secondary | ICD-10-CM | POA: Diagnosis not present

## 2021-09-15 DIAGNOSIS — F321 Major depressive disorder, single episode, moderate: Secondary | ICD-10-CM | POA: Diagnosis not present

## 2021-09-15 MED ORDER — NALTREXONE HCL 50 MG PO TABS: 50.0000 mg | ORAL_TABLET | Freq: Every day | ORAL | 1 refills | Status: DC

## 2021-09-15 MED ORDER — VALSARTAN 80 MG PO TABS: 80.0000 mg | ORAL_TABLET | Freq: Every day | ORAL | 3 refills | Status: DC

## 2021-09-15 NOTE — Patient Instructions (Addendum)
Emergency Mental Health Services:  [facility] (like an urgent care for mental health) [address] [place], [place] [address] [phone]  [facility] Several different mental health services, has a crisis center  [place] [place] [address] [place], [place] [address]  [phone] Same-Day Access Hours:  Monday, Wednesday and Friday, 8am - 3pm Crisis & Diversion Center: Walk-In Crisis Hours: 7 days/week, 8am - 12am -  Musician -  Patent examiner Drop-Off (side door)  Alcohol Use Education Information about Your Drinking Your score on the Alcohol Use Disorders Identification Test was: AUDIT C:  9 TOTAL AUDIT SCORE:  13.  This score places you in the category of:  Score 0 = Abstainers Score 8-19 = Unhealthy/High Risk Drinkers  Score 1-7 = Low Risk Drinkers Score 20+ = Probable Alcohol Dependence   High Scores (20+) on the Alcohol Use Identification Test Consider becoming involved in a structured program.  You should stop drinking if: You have tried to cut down before but have not been successful, or  You suffer from morning shakes during a heavy drinking period, or You have high blood pressure, or You are pregnant, or You have liver disease, or You are taking medicines that react with alcohol, or Your alcohol use is affecting your social relationships, or You have legal consequences like DUIs, or You call in sick to work, or You cannot take care of our children, or Someone close to you says you drink too much    How Much Alcohol is a Drink: Beer: 12 oz. = 1 drink 16 oz. = 1.3 drinks 22 oz. = 2 drinks 40 oz. = 3.3 drinks  Wine: 5 oz. = 1 drink 740 mL (25 oz.) bottle = 5 drinks Malt Liquor: 12 oz. = 1.5 drinks 16 oz. = 2 drinks 22 oz. = 2.5 drinks 40 oz. = 4.5 drinks  80-Proof Spirits - Hard Liquor: 1 shot = 1 drink 1 mixed drink = number of shots Can equal 1-3 drinks   What is  Low-risk Drinking? Have no more than 2 drinks of alcohol per day Drink no more than 5 days per week Do not drink alcohol drink alcohol when: You drive or operate machinery You are pregnant or breast feeding You are taking medications that interact with alcohol You have medical conditions made worse with alcohol You can stop or control your drinking      Identify Your Triggers for Drinking Parties Particular People Feeling lonely Feeling tense Family problems Feeling sad Feeling happy Feeling bored After work Problems sleeping Criticism Feelings of failure After being paid When others are drinking In bars When out for dinner After arguments Weekends Feeling restless Being in pain   Effects of High-Risk Drinking To the Brain: Aggressive, irrational behavior Arguments, violence Depression, nervousness Alcohol dependence, memory loss To the Nervous System: Trembling hands, tingling fingers Numbness, painful nerves Impaired sensation leading to falls Numb tingling toes To Your Lifestyle: Social, legal, medical problems Domestic trouble/relationship loss Job loss & financial problems Shortened life span Accidents and death from drunk driving   To the Face: Premature aging, drinker's nose Cancer of the throat & mouth To the Body: Frequent cold Reduced resistance to infection Increased risk of pneumonia Weakness of heart muscle Heart failure, anemia Impaired blood clotting Breast cancer Vitamin deficiency, bleeding Severe Inflammation of the stomach Vomiting, diarrhea, malnutrition Ulcer, inflammation of the pancreas Impaired sexual performance Birth defects, including deformities, retardation, and low birthweight  Ways to Cope Without Drinking Go home if you tend to drink after work Find another activity Switch to nonalcoholic beverages Change friends Join a Lexicographer Visit relatives Plan/take a trip Go for a walk Take up a hobby Listen  to music Talk to a friend Reading What would you do if you had no worries about failing?         Good Reasons for Drinking Less I will live longer - probably 8-10 years. I will sleep better. I will be happier. I will save a lot of money My relationships will improve. I will stay younger for longer. I will achieve more in my life There will be a greater chance that I will survive to a healthy old age with no premature damage to my brain.  I will be better at my job. I will be less likely to feel depressed and commit suicide (6 times less likely). I will be less likely to die of heart disease or cancer. Other people will respect me I will be less likely to get into trouble with the police. The possibility that I will die of liver disease will be dramatically reduced (12 times less likely). It will be less likely that I will die in a car accident (3 times less likely).   Strategies for Cutting Down Keep Track.  Find a way to keep track of how much you drink.  If you make a note of each drink before you drink it, this will help slow you down. Count and Measure.  Know the standard drink sizes.  Ask the bartender or server about the amount of alcohol in a mixed drink. Set Goals.  Decide how many days a week you will drink and how many drinks each day. Pace and Space.  When you do drink, pace yourself.  Have no more than one drink with alcohol per hour.  Alternate "drink spacers" non-alcoholic drinks such as water, soda, or juice with drinks containing alcohol. Include Food.  Don't drink on an empty stomach.  Have some food so the alcohol will be absorbed more slowly into your system.  Avoid Triggers.  Avoid people, places, or activities that have led to drinking in the past.  Certain times of day or feelings may also be triggers.  Make a plan so you will know what you can do instead of drinking. Plan to Handle Urges.  When an urge hits, consider these options:  Remind yourself of your  reasons for changing.  Or talk it through with someone you trust. Or get involved with a healthy, distracting activity.  Or, "urge surf" - instead of fighting the feeling, accept it and ride it out, knowing it will soon crest like a wave and pass. Know Your "No".  Have a polite, convincing "no thanks" for those times when you may be offered a drink and don't want one.  The faster you can say no to these offers, the less likely you are to give in.  If you hesitate, it allows you time to think of excuses to go along.

## 2021-09-15 NOTE — Assessment & Plan Note (Addendum)
Pt acknowledges it is a problem and would like assistance in quitting. Gave resources for mental health assistance, AA groups Discussed medications -- naltrexone, antabuse.  Decided on naltrexone daily, starting at 25 mg and increasing to 50 mg. Advised SE of medication, cannot take with an opioid pain medications Will check lfts next visit.

## 2021-09-15 NOTE — Assessment & Plan Note (Signed)
Still uncontrolled. Pt states she has been without meds for a week; but last few visits she was medicated and still uncontrolled. Increasing valsartan to 80 mg, f/u 4 weeks htn check Reviewed last cmp

## 2021-09-15 NOTE — Progress Notes (Signed)
I,Sha'taria Tyson,acting as a Education administrator for [employer], PA-C.,have documented all relevant documentation on the behalf of Mikey Kirschner, PA-C,as directed by  Mikey Kirschner, PA-C while in the presence of Mikey Kirschner, PA-C.   Established patient visit   Patient: Tamara Hoffman   DOB: 01-01-1966   56 y.o. Female  MRN: [id number] Visit Date: 09/15/2021  Today's healthcare provider: Mikey Kirschner, PA-C   Cc. Htn f/u  Subjective    HPI  Alcohol use -Pt admits to this being a problem. She will drink 3-4 nights out of the week, usually 7-10 drinks. Reports last time she drank was Friday, had 5 big beer cans. She has gone 7-10 days without drinking but always feels the need. She has been to groups before but found them unhelpful.  [facility] [place] [facility] Visit from 09/15/2021 in [place]  AUDIT-C Score 9          07/21/2021    9:41 AM 06/18/2021   10:32 AM 03/20/2021    3:09 PM  PHQ9 SCORE ONLY  PHQ-9 Total Score _0 Hypertension, follow-up  BP Readings from Last 3 Encounters:  09/15/21 (!) 192/113  07/21/21 (!) 169/81  06/18/21 (!) 174/122   Wt Readings from Last 3 Encounters:  09/15/21 209 lb 4.8 oz (94.9 kg)  07/21/21 201 lb 3.2 oz (91.3 kg)  06/18/21 206 lb (93.4 kg)     She was last seen for hypertension 8 weeks ago.  BP at that visit was 169/81. Management since that visit includes continue current treatment.  She reports excellent compliance with treatment. She is not having side effects.  She is following a Low Sodium diet. She is exercising. She does smoke.  Use of agents associated with hypertension: none.   Outside blood pressures are 145/95 - 199/111. She has been out of medication for 8 days Symptoms: No chest pain No chest pressure  No palpitations No syncope  No dyspnea No orthopnea  No paroxysmal nocturnal dyspnea Yes lower extremity edema   Pertinent labs Lab Results  Component Value Date   CHOL 189 03/06/2021    HDL 85 03/06/2021   LDLCALC 88 03/06/2021   TRIG 88 03/06/2021   CHOLHDL 2.2 03/06/2021   Lab Results  Component Value Date   NA 136 07/21/2021   K 5.2 07/21/2021   CREATININE 0.97 07/21/2021   EGFR 69 07/21/2021   GLUCOSE 113 (H) 07/21/2021   TSH 2.020 10/25/2018     The 10-year ASCVD risk score (Arnett DK, et al., 2019) is: 9%  ---------------------------------------------------------------------------------------------------   Medications: Outpatient Medications Prior to Visit  Medication Sig   albuterol (VENTOLIN HFA) 108 (90 Base) MCG/ACT inhaler Inhale 2 puffs into the lungs every 6 (six) hours as needed for wheezing or shortness of breath.   busPIRone (BUSPAR) 5 MG tablet 1/2 tablet twice a day for 6 days, then 1 tablet twice a day for six days, then 1 in the am and 2 at night for six days, then 2 tablets twice a day   celecoxib (CELEBREX) 100 MG capsule Take 100 mg by mouth 2 (two) times daily.   clobetasol (TEMOVATE) 0.05 % external solution Apply 1 application topically 2 (two) times daily.   folic acid (FOLVITE) 1 MG tablet Take 1 mg by mouth daily.   pantoprazole (PROTONIX) 40 MG tablet Take 1 tablet (40 mg total) by mouth daily.   Tapinarof (VTAMA) 1 % CREA Apply 1 application topically daily.  triamcinolone ointment (KENALOG) 0.5 % Apply 1 application topically 2 (two) times daily.   [DISCONTINUED] valsartan (DIOVAN) 40 MG tablet TAKE 1 TABLET(40 MG) BY MOUTH DAILY   [DISCONTINUED] azithromycin (ZITHROMAX) 250 MG tablet Take 2 tablets PO on Day 1 and then 1 tablet PO on Day 2-5. (Patient not taking: Reported on 09/15/2021)   No facility-administered medications prior to visit.    Review of Systems  Constitutional:  Negative for fatigue and fever.  Respiratory:  Negative for cough and shortness of breath.   Cardiovascular:  Negative for chest pain and leg swelling.  Gastrointestinal:  Negative for abdominal pain.  Neurological:  Negative for dizziness and  headaches.       Objective    Blood pressure (!) 192/113, pulse 81, height _0  (1.626 m), weight 209 lb 4.8 oz (94.9 kg), SpO2 100 %.   Physical Exam Constitutional:      General: She is awake.     Appearance: She is well-developed.  HENT:     Head: Normocephalic.  Eyes:     Conjunctiva/sclera: Conjunctivae normal.  Cardiovascular:     Rate and Rhythm: Normal rate and regular rhythm.     Heart sounds: Normal heart sounds.  Pulmonary:     Effort: Pulmonary effort is normal.     Breath sounds: Normal breath sounds.  Skin:    General: Skin is warm.  Neurological:     Mental Status: She is alert and oriented to person, place, and time.  Psychiatric:        Attention and Perception: Attention normal.        Mood and Affect: Mood normal.        Speech: Speech normal.        Behavior: Behavior is cooperative.     No results found for any visits on 09/15/21.  Assessment & Plan     Problem List Items Addressed This Visit       Cardiovascular and Mediastinum   Primary hypertension - Primary    Still uncontrolled. Pt states she has been without meds for a week; but last few visits she was medicated and still uncontrolled. Increasing valsartan to 80 mg, f/u 4 weeks htn check Reviewed last cmp      Relevant Medications   valsartan (DIOVAN) 80 MG tablet     Other   Depression, major, single episode, moderate (HCC)   Relevant Orders   Ambulatory referral to Psychiatry   Alcohol use disorder    Pt acknowledges it is a problem and would like assistance in quitting. Gave resources for mental health assistance, AA groups Discussed medications -- naltrexone, antabuse.  Decided on naltrexone daily, starting at 25 mg and increasing to 50 mg. Advised SE of medication, cannot take with an opioid pain medications Will check lfts next visit.       Relevant Medications   naltrexone (DEPADE) 50 MG tablet   Other Relevant Orders   Ambulatory referral to Psychiatry    Return in  about 4 weeks (around 10/13/2021) for hypertension.      I, Mikey Kirschner, PA-C have reviewed all documentation for this visit. The documentation on  09/15/2021 for the exam, diagnosis, procedures, and orders are all accurate and complete.  Mikey Kirschner, PA-C [facility] [address] [place], [place], [address] Office: [phone] Fax: [place]

## 2021-09-19 ENCOUNTER — Ambulatory Visit: Payer: Self-pay | Admitting: *Deleted

## 2021-09-19 NOTE — Telephone Encounter (Signed)
Summary: Medication reaction   Patient states she was prescribed naltrexone (DEPADE) 50 MG tablet and medication is making her vomit, loss of appetite, sleepiness, chills, and  leg cramps   Patient states she has stop taking the medication due to possible side effects   Please fu w/ patient      Chief Complaint: medication symptons Symptoms: muscles hurt, headache, nausea, sleepiness and chills Frequency: constant Pertinent Negatives: Patient denies drinking Disposition: [] ED /[] Urgent Care (no appt availability in office) / [] Appointment(In office/virtual)/ []  [facility]/ [] Home Care/ [] Refused Recommended Disposition /[] [facility]/ [x]  Follow-up with PCP Additional Notes: See symptoms in pt's call notes. States she quit taking Depade, Wed was last dose, she is taking Buspar. Discussed some symptoms could be from Depade and some could be from withdrawal, she states she is not drinking alcohol. BP better, pt has appt 10/13/21 so did not know if you need to  talk to her about her medication problem or if she should just stay off of it until she sees you in August.   Reason for Disposition  [1] Caller has URGENT medicine question about med that PCP or specialist prescribed AND [2] triager unable to answer question  Answer Assessment - Initial Assessment Questions 1. NAME of MEDICINE: "What medicine(s) are you calling about?"     Depade and Buspar 2. QUESTION: "What is your question?" (e.g., double dose of medicine, side effect)     Depade makes her feel worse and she quit taking 3. PRESCRIBER: "Who prescribed the medicine?" Reason: if prescribed by specialist, call should be referred to that group.     [facility] 4. SYMPTOMS: "Do you have any symptoms?" If Yes, ask: "What symptoms are you having?"  "How bad are the symptoms (e.g., mild, moderate, severe)     Shakes, nausea, leg pain, can't eat, headache 5. PREGNANCY:  "Is there any chance that you are  pregnant?" "When was your last menstrual period?"     no  Protocols used: Medication Question Call-A-AH

## 2021-09-24 ENCOUNTER — Telehealth: Payer: Self-pay | Admitting: Gastroenterology

## 2021-09-24 NOTE — Telephone Encounter (Signed)
Patient called stating she needs to cancel her colonoscopy. States she will call back to reschedule whenever she gets back in town but she does not know when that will be.

## 2021-09-25 ENCOUNTER — Telehealth: Payer: Self-pay

## 2021-09-25 NOTE — Telephone Encounter (Signed)
Pt lvm to cancel Friday's colonoscopy due to traveling out of town.  Call returned to let her know her procedure has been canceled, and she could call back when she was ready to reschedule.  Thanks,  [place], [place]

## 2021-09-26 ENCOUNTER — Ambulatory Visit: Admission: RE | Admit: 2021-09-26 | Payer: 59 | Source: Ambulatory Visit | Admitting: Gastroenterology

## 2021-09-26 ENCOUNTER — Encounter: Admission: RE | Payer: Self-pay | Source: Ambulatory Visit

## 2021-09-26 SURGERY — COLONOSCOPY WITH PROPOFOL
Anesthesia: General

## 2021-10-06 ENCOUNTER — Encounter: Payer: Self-pay | Admitting: Obstetrics & Gynecology

## 2021-10-06 ENCOUNTER — Ambulatory Visit (INDEPENDENT_AMBULATORY_CARE_PROVIDER_SITE_OTHER): Payer: 59 | Admitting: Obstetrics & Gynecology

## 2021-10-06 VITALS — BP 140/90 | Ht 64.0 in | Wt 210.0 lb

## 2021-10-06 DIAGNOSIS — A630 Anogenital (venereal) warts: Secondary | ICD-10-CM

## 2021-10-06 NOTE — Progress Notes (Signed)
   Subjective:    Patient ID: Tamara Hoffman, female    DOB: June 11, 1965, 56 y.o.   MRN: [id number]  HPI This 56 yo lady is here today to have her genital warts treated again. She has had these warts since about 2020. She tried Aldara at home but reported that the burning was intolerable. She was treated 08/08/2019 here with cryo. She reports that there has not been an improvement. She takes prednisone for her psoriasis.   Review of Systems Her pap was normal in 2021     Objective:   Physical Exam Well nourished, well hydrated White female, no apparent distress She is ambulating and conversing normally. I used Haiku to add photos of her condyloma, labial and perianal, to her chart under media. I treated all of them with TCA and smeared lubricant on the normal surrounding skin. She tolerated this procedure well.       Assessment & Plan:   Long term condyloma- She will come back every 1-2 weeks for TCA treatment. Her mammogram has been ordered but she needs to be reminded to schedule it.

## 2021-10-10 NOTE — Progress Notes (Deleted)
I,Sha'taria Kindred Heying,acting as a Education administrator for [employer], PA-C.,have documented all relevant documentation on the behalf of Mikey Kirschner, PA-C,as directed by  Mikey Kirschner, PA-C while in the presence of Mikey Kirschner, PA-C.   Established patient visit   Patient: Tamara Hoffman   DOB: 02-13-1966   56 y.o. Female  MRN: [id number] Visit Date: 10/13/2021  Today's healthcare provider: Mikey Kirschner, PA-C   No chief complaint on file.  Subjective    HPI  Hypertension, follow-up  BP Readings from Last 3 Encounters:  10/06/21 (!) 140/90  09/15/21 (!) 192/113  07/21/21 (!) 169/81   Wt Readings from Last 3 Encounters:  10/06/21 210 lb (95.3 kg)  09/15/21 209 lb 4.8 oz (94.9 kg)  07/21/21 201 lb 3.2 oz (91.3 kg)     She was last seen for hypertension 4 weeks ago.  BP at that visit was 192/113. Management since that visit includes Increasing valsartan to 80 mg.  She reports {excellent/good/fair/poor:19665} compliance with treatment. She {is/is not:9024} having side effects. {document side effects if present:1} She is following a {diet:21022986} diet. She {is/is not:9024} exercising. She does smoke.  Outside blood pressures are {***enter patient reported home BP readings, or 'not being checked':1}. Symptoms: {Yes/No:20286} chest pain {Yes/No:20286} chest pressure  {Yes/No:20286} palpitations {Yes/No:20286} syncope  {Yes/No:20286} dyspnea {Yes/No:20286} orthopnea  {Yes/No:20286} paroxysmal nocturnal dyspnea {Yes/No:20286} lower extremity edema   Pertinent labs Lab Results  Component Value Date   CHOL 189 03/06/2021   HDL 85 03/06/2021   LDLCALC 88 03/06/2021   TRIG 88 03/06/2021   CHOLHDL 2.2 03/06/2021   Lab Results  Component Value Date   NA 136 07/21/2021   K 5.2 07/21/2021   CREATININE 0.97 07/21/2021   EGFR 69 07/21/2021   GLUCOSE 113 (H) 07/21/2021   TSH 2.020 10/25/2018     The 10-year ASCVD risk score (Arnett DK, et al., 2019) is:  4.8%  ---------------------------------------------------------------------------------------------------  Follow up for Alcohol use disorder  The patient was last seen for this 4 weeks ago. Changes made at last visit include naltrexone daily, starting at 25 mg and increasing to 50 mg.   She reports {excellent/good/fair/poor:19665} compliance with treatment. She feels that condition is {improved/worse/unchanged:3041574}. She {is/is not:21021397} having side effects. ***  -----------------------------------------------------------------------------------------  Medications: Outpatient Medications Prior to Visit  Medication Sig   albuterol (VENTOLIN HFA) 108 (90 Base) MCG/ACT inhaler Inhale 2 puffs into the lungs every 6 (six) hours as needed for wheezing or shortness of breath.   busPIRone (BUSPAR) 5 MG tablet 1/2 tablet twice a day for 6 days, then 1 tablet twice a day for six days, then 1 in the am and 2 at night for six days, then 2 tablets twice a day   celecoxib (CELEBREX) 100 MG capsule Take 100 mg by mouth 2 (two) times daily.   clobetasol (TEMOVATE) 0.05 % external solution Apply 1 application topically 2 (two) times daily.   folic acid (FOLVITE) 1 MG tablet Take 1 mg by mouth daily.   naltrexone (DEPADE) 50 MG tablet Take 1 tablet (50 mg total) by mouth daily. Start with 1/2 tablet for 1 week and then increase to full tablet daily   pantoprazole (PROTONIX) 40 MG tablet Take 1 tablet (40 mg total) by mouth daily.   Tapinarof (VTAMA) 1 % CREA Apply 1 application topically daily.   triamcinolone ointment (KENALOG) 0.5 % Apply 1 application topically 2 (two) times daily.   valsartan (DIOVAN) 80 MG tablet Take 1 tablet (80 mg total)  by mouth daily.   No facility-administered medications prior to visit.    Review of Systems  {Labs  Heme  Chem  Endocrine  Serology  Results Review (optional):23779}   Objective    LMP  (LMP Unknown)  {Show previous vital signs  (optional):23777}  Physical Exam  ***  No results found for any visits on 10/13/21.  Assessment & Plan     ***  No follow-ups on file.      {provider attestation***:1}   Mikey Kirschner, PA-C  [facility] [phone] (phone) [phone] (fax)  [place]

## 2021-10-11 ENCOUNTER — Other Ambulatory Visit: Payer: Self-pay | Admitting: Family Medicine

## 2021-10-11 DIAGNOSIS — R0789 Other chest pain: Secondary | ICD-10-CM

## 2021-10-13 ENCOUNTER — Ambulatory Visit: Payer: 59 | Admitting: Obstetrics & Gynecology

## 2021-10-13 ENCOUNTER — Ambulatory Visit: Payer: 59 | Admitting: Physician Assistant

## 2021-11-12 DIAGNOSIS — L408 Other psoriasis: Secondary | ICD-10-CM | POA: Diagnosis not present

## 2021-11-12 DIAGNOSIS — M159 Polyosteoarthritis, unspecified: Secondary | ICD-10-CM | POA: Diagnosis not present

## 2021-11-12 DIAGNOSIS — M79645 Pain in left finger(s): Secondary | ICD-10-CM | POA: Diagnosis not present

## 2021-11-12 DIAGNOSIS — L4050 Arthropathic psoriasis, unspecified: Secondary | ICD-10-CM | POA: Diagnosis not present

## 2021-11-12 DIAGNOSIS — M65331 Trigger finger, right middle finger: Secondary | ICD-10-CM | POA: Diagnosis not present

## 2021-11-12 DIAGNOSIS — Z7960 Long term (current) use of unspecified immunomodulators and immunosuppressants: Secondary | ICD-10-CM | POA: Diagnosis not present

## 2021-11-12 DIAGNOSIS — G8929 Other chronic pain: Secondary | ICD-10-CM | POA: Diagnosis not present

## 2021-11-28 DIAGNOSIS — R1084 Generalized abdominal pain: Secondary | ICD-10-CM | POA: Diagnosis not present

## 2021-11-28 DIAGNOSIS — R1013 Epigastric pain: Secondary | ICD-10-CM | POA: Diagnosis not present

## 2021-11-29 DIAGNOSIS — R000 Tachycardia, unspecified: Secondary | ICD-10-CM | POA: Diagnosis not present

## 2021-12-13 ENCOUNTER — Other Ambulatory Visit: Payer: Self-pay | Admitting: Family Medicine

## 2021-12-13 DIAGNOSIS — F411 Generalized anxiety disorder: Secondary | ICD-10-CM

## 2021-12-18 ENCOUNTER — Inpatient Hospital Stay: Payer: Self-pay | Admitting: Physician Assistant

## 2021-12-22 NOTE — Progress Notes (Deleted)
      Established patient visit   Patient: Tamara Hoffman   DOB: Dec 28, 1965   56 y.o. Female  MRN: [id number] Visit Date: 12/23/2021  Today's healthcare provider: Mikey Kirschner, PA-C   No chief complaint on file.  Subjective    HPI  Follow up ER visit  Patient was seen in ER for abdominal pain and N/V/D on 11/28/2021. Per hospital  Discharge Disposition: Patient Left Against Medical Advice or discontinued care. She reports {DESC; EXCELLENT/GOOD/FAIR:19992} compliance with treatment. She reports this condition is {improved/worse/unchanged:3041574}.  -----------------------------------------------------------------------------------------  -   Medications: Outpatient Medications Prior to Visit  Medication Sig   albuterol (VENTOLIN HFA) 108 (90 Base) MCG/ACT inhaler Inhale 2 puffs into the lungs every 6 (six) hours as needed for wheezing or shortness of breath.   busPIRone (BUSPAR) 5 MG tablet Take 1-2 tablets (5-10 mg total) by mouth 2 (two) times daily.   celecoxib (CELEBREX) 100 MG capsule Take 100 mg by mouth 2 (two) times daily.   clobetasol (TEMOVATE) 0.05 % external solution Apply 1 application topically 2 (two) times daily.   folic acid (FOLVITE) 1 MG tablet Take 1 mg by mouth daily.   naltrexone (DEPADE) 50 MG tablet Take 1 tablet (50 mg total) by mouth daily. Start with 1/2 tablet for 1 week and then increase to full tablet daily   pantoprazole (PROTONIX) 40 MG tablet TAKE 1 TABLET(40 MG) BY MOUTH DAILY   Tapinarof (VTAMA) 1 % CREA Apply 1 application topically daily.   triamcinolone ointment (KENALOG) 0.5 % Apply 1 application topically 2 (two) times daily.   valsartan (DIOVAN) 80 MG tablet Take 1 tablet (80 mg total) by mouth daily.   No facility-administered medications prior to visit.    Review of Systems  {Labs  Heme  Chem  Endocrine  Serology  Results Review (optional):23779}   Objective    LMP  (LMP Unknown)  {Show previous vital signs  (optional):23777}  Physical Exam  ***  No results found for any visits on 12/23/21.  Assessment & Plan     ***  No follow-ups on file.      {provider attestation***:1}   Mikey Kirschner, PA-C  [facility] [phone] (phone) [phone] (fax)  [place]

## 2021-12-23 ENCOUNTER — Inpatient Hospital Stay: Payer: Self-pay | Admitting: Physician Assistant

## 2021-12-25 ENCOUNTER — Ambulatory Visit: Payer: Self-pay | Admitting: Physician Assistant

## 2021-12-25 NOTE — Progress Notes (Deleted)
     I,Sha'taria Aubreanna Percle,acting as a Education administrator for [employer], PA-C.,have documented all relevant documentation on the behalf of Mikey Kirschner, PA-C,as directed by  Mikey Kirschner, PA-C while in the presence of Mikey Kirschner, PA-C.   Established patient visit   Patient: Tamara Hoffman   DOB: 12-20-1965   55 y.o. Female  MRN: [id number] Visit Date: 12/25/2021  Today's healthcare provider: Mikey Kirschner, PA-C   No chief complaint on file.  Subjective    HPI     Medications: Outpatient Medications Prior to Visit  Medication Sig   albuterol (VENTOLIN HFA) 108 (90 Base) MCG/ACT inhaler Inhale 2 puffs into the lungs every 6 (six) hours as needed for wheezing or shortness of breath.   busPIRone (BUSPAR) 5 MG tablet Take 1-2 tablets (5-10 mg total) by mouth 2 (two) times daily.   celecoxib (CELEBREX) 100 MG capsule Take 100 mg by mouth 2 (two) times daily.   clobetasol (TEMOVATE) 0.05 % external solution Apply 1 application topically 2 (two) times daily.   folic acid (FOLVITE) 1 MG tablet Take 1 mg by mouth daily.   naltrexone (DEPADE) 50 MG tablet Take 1 tablet (50 mg total) by mouth daily. Start with 1/2 tablet for 1 week and then increase to full tablet daily   pantoprazole (PROTONIX) 40 MG tablet TAKE 1 TABLET(40 MG) BY MOUTH DAILY   Tapinarof (VTAMA) 1 % CREA Apply 1 application topically daily.   triamcinolone ointment (KENALOG) 0.5 % Apply 1 application topically 2 (two) times daily.   valsartan (DIOVAN) 80 MG tablet Take 1 tablet (80 mg total) by mouth daily.   No facility-administered medications prior to visit.    Review of Systems  {Labs  Heme  Chem  Endocrine  Serology  Results Review (optional):23779}   Objective    LMP  (LMP Unknown)  {Show previous vital signs (optional):23777}  Physical Exam  ***  No results found for any visits on 12/25/21.  Assessment & Plan     ***  No follow-ups on file.      {provider attestation***:1}   Mikey Kirschner,  PA-C  [facility] [phone] (phone) [phone] (fax)  [place]

## 2022-01-19 NOTE — Progress Notes (Deleted)
      I,Sha'taria Quynh Basso,acting as a Neurosurgeon for [employer], PA-C.,have documented all relevant documentation on the behalf of Alfredia Ferguson, PA-C,as directed by  Alfredia Ferguson, PA-C while in the presence of Alfredia Ferguson, PA-C.  Established patient visit   Patient: Tamara Hoffman   DOB: 1965-03-14   56 y.o. Female  MRN: [id number] Visit Date: 01/20/2022  Today's healthcare provider: Alfredia Ferguson, PA-C   No chief complaint on file.  Subjective    HPI  ***  Medications: Outpatient Medications Prior to Visit  Medication Sig   albuterol (VENTOLIN HFA) 108 (90 Base) MCG/ACT inhaler Inhale 2 puffs into the lungs every 6 (six) hours as needed for wheezing or shortness of breath.   busPIRone (BUSPAR) 5 MG tablet Take 1-2 tablets (5-10 mg total) by mouth 2 (two) times daily.   celecoxib (CELEBREX) 100 MG capsule Take 100 mg by mouth 2 (two) times daily.   clobetasol (TEMOVATE) 0.05 % external solution Apply 1 application topically 2 (two) times daily.   folic acid (FOLVITE) 1 MG tablet Take 1 mg by mouth daily.   naltrexone (DEPADE) 50 MG tablet Take 1 tablet (50 mg total) by mouth daily. Start with 1/2 tablet for 1 week and then increase to full tablet daily   pantoprazole (PROTONIX) 40 MG tablet TAKE 1 TABLET(40 MG) BY MOUTH DAILY   Tapinarof (VTAMA) 1 % CREA Apply 1 application topically daily.   triamcinolone ointment (KENALOG) 0.5 % Apply 1 application topically 2 (two) times daily.   valsartan (DIOVAN) 80 MG tablet Take 1 tablet (80 mg total) by mouth daily.   No facility-administered medications prior to visit.    Review of Systems  {Labs  Heme  Chem  Endocrine  Serology  Results Review (optional):23779}   Objective    LMP  (LMP Unknown)  {Show previous vital signs (optional):23777}  Physical Exam  ***  No results found for any visits on 01/20/22.  Assessment & Plan     ***  No follow-ups on file.      {provider attestation***:1}   Alfredia Ferguson,  PA-C  [facility] [phone] (phone) [phone] (fax)  [facility]

## 2022-01-20 ENCOUNTER — Ambulatory Visit: Payer: Self-pay | Admitting: Physician Assistant

## 2022-01-20 NOTE — Progress Notes (Deleted)
     I,Sha'taria Philopater Mucha,acting as a Neurosurgeon for [employer], PA-C.,have documented all relevant documentation on the behalf of Alfredia Ferguson, PA-C,as directed by  Alfredia Ferguson, PA-C while in the presence of Alfredia Ferguson, PA-C.   Established patient visit   Patient: Tamara Hoffman   DOB: 1965/04/02   56 y.o. Female  MRN: [id number] Visit Date: 01/21/2022  Today's healthcare provider: Alfredia Ferguson, PA-C   No chief complaint on file.  Subjective    HPI  ***  Medications: Outpatient Medications Prior to Visit  Medication Sig   albuterol (VENTOLIN HFA) 108 (90 Base) MCG/ACT inhaler Inhale 2 puffs into the lungs every 6 (six) hours as needed for wheezing or shortness of breath.   busPIRone (BUSPAR) 5 MG tablet Take 1-2 tablets (5-10 mg total) by mouth 2 (two) times daily.   celecoxib (CELEBREX) 100 MG capsule Take 100 mg by mouth 2 (two) times daily.   clobetasol (TEMOVATE) 0.05 % external solution Apply 1 application topically 2 (two) times daily.   folic acid (FOLVITE) 1 MG tablet Take 1 mg by mouth daily.   naltrexone (DEPADE) 50 MG tablet Take 1 tablet (50 mg total) by mouth daily. Start with 1/2 tablet for 1 week and then increase to full tablet daily   pantoprazole (PROTONIX) 40 MG tablet TAKE 1 TABLET(40 MG) BY MOUTH DAILY   Tapinarof (VTAMA) 1 % CREA Apply 1 application topically daily.   triamcinolone ointment (KENALOG) 0.5 % Apply 1 application topically 2 (two) times daily.   valsartan (DIOVAN) 80 MG tablet Take 1 tablet (80 mg total) by mouth daily.   No facility-administered medications prior to visit.    Review of Systems  {Labs  Heme  Chem  Endocrine  Serology  Results Review (optional):23779}   Objective    LMP  (LMP Unknown)  {Show previous vital signs (optional):23777}  Physical Exam  ***  No results found for any visits on 01/21/22.  Assessment & Plan     ***  No follow-ups on file.      {provider attestation***:1}   Alfredia Ferguson,  PA-C  [facility] [phone] (phone) [phone] (fax)  [facility]

## 2022-01-21 ENCOUNTER — Ambulatory Visit: Payer: Self-pay | Admitting: Physician Assistant

## 2022-01-26 ENCOUNTER — Other Ambulatory Visit: Payer: Self-pay

## 2022-01-27 ENCOUNTER — Ambulatory Visit: Payer: 59 | Admitting: Gastroenterology

## 2022-02-02 NOTE — Progress Notes (Signed)
I,Connie R Striblin,acting as a Neurosurgeon for [employer], PA-C.,have documented all relevant documentation on the behalf of Alfredia Ferguson, PA-C,as directed by  Alfredia Ferguson, PA-C while in the presence of Alfredia Ferguson, PA-C. Established patient visit   Patient: Tamara Hoffman   DOB: [birth year]   56 y.o. Female  MRN: [id number] Visit Date: 02/03/2022  Today's healthcare provider: Alfredia Ferguson, PA-C   Cc. Anxiety f/u  Subjective    HPI  Follow up for Medication Refill- Buspar 5mg    The patient was last seen for this 6 months ago. Changes made at last visit include none.  She reports excellent compliance with treatment. She feels that condition is Improved. She is not having side effects.  Pt would like an increase if possible. She has been take 5mg  once a day but would like to increase to twice daily.   She feels her anxiety is much improved since taking.  She reports still drinking alcohol.      02/03/2022    8:18 AM  GAD 7 : Generalized Anxiety Score  Nervous, Anxious, on Edge 1  Control/stop worrying 2  Worry too much - different things 3  Trouble relaxing 3  Restless 0  Easily annoyed or irritable 0  Afraid - awful might happen 2  Total GAD 7 Score 11  Anxiety Difficulty Not difficult at all        02/03/2022    8:45 AM 07/21/2021    9:41 AM 06/18/2021   10:32 AM  PHQ9 SCORE ONLY  PHQ-9 Total Score 9 9 9      -----------------------------------------------------------------------------------------  She also reports leg cramps in the evening, sometimes that wake her from sleep. Unable to ID if this is when she drinks or not.   Pt denies checking her blood pressure recently. Given hypertensive urgency today-- pt denies chest pain, headache. Able to urinate without issue. When asked about vision changes-- she thinks recently she may have more blurred vision than normal.   Medications: Outpatient Medications Prior to Visit  Medication Sig   albuterol  (VENTOLIN HFA) 108 (90 Base) MCG/ACT inhaler Inhale 2 puffs into the lungs every 6 (six) hours as needed for wheezing or shortness of breath.   celecoxib (CELEBREX) 100 MG capsule Take 100 mg by mouth 2 (two) times daily.   diphenhydrAMINE (BENADRYL) 25 mg capsule Take 25 mg by mouth every 6 (six) hours as needed.   Fluocinolone Acetonide Body 0.01 % OIL Apply topically.   folic acid (FOLVITE) 1 MG tablet Take 1 mg by mouth daily.   predniSONE (DELTASONE) 5 MG tablet Take 5 mg by mouth daily with breakfast.   Tapinarof (VTAMA) 1 % CREA Apply 1 application topically daily.   triamcinolone ointment (KENALOG) 0.5 % Apply 1 application topically 2 (two) times daily.   [DISCONTINUED] busPIRone (BUSPAR) 5 MG tablet Take 1-2 tablets (5-10 mg total) by mouth 2 (two) times daily.   [DISCONTINUED] pantoprazole (PROTONIX) 40 MG tablet TAKE 1 TABLET(40 MG) BY MOUTH DAILY   [DISCONTINUED] valsartan (DIOVAN) 80 MG tablet Take 1 tablet (80 mg total) by mouth daily.   [DISCONTINUED] clobetasol (TEMOVATE) 0.05 % external solution Apply 1 application topically 2 (two) times daily. (Patient not taking: Reported on 02/03/2022)   [DISCONTINUED] Elastic Bandages & Supports (FUTURO REVERSIBLE WRIST BRACE) MISC One hand brace for left hand to be used daily. (Patient not taking: Reported on 02/03/2022)   No facility-administered medications prior to visit.    Review of Systems  Constitutional:  Positive for unexpected weight change. Negative for fatigue and fever.  Respiratory:  Negative for cough and shortness of breath.   Cardiovascular:  Negative for chest pain and leg swelling.  Gastrointestinal:  Negative for abdominal pain.  Musculoskeletal:        Muscle cramps  Neurological:  Negative for dizziness and headaches.      Objective    Blood pressure (!) 209/128, pulse 77, temperature 98.1 F (36.7 C), temperature source Oral, weight 216 lb 9.6 oz (98.2 kg), SpO2 100 %.   Physical Exam Constitutional:       General: She is awake.     Appearance: She is well-developed.  HENT:     Head: Normocephalic.  Eyes:     Conjunctiva/sclera: Conjunctivae normal.     Pupils: Pupils are equal, round, and reactive to light.  Cardiovascular:     Rate and Rhythm: Normal rate and regular rhythm.     Heart sounds: Normal heart sounds.  Pulmonary:     Effort: Pulmonary effort is normal.     Breath sounds: Normal breath sounds.  Skin:    General: Skin is warm.  Neurological:     Mental Status: She is alert and oriented to person, place, and time.  Psychiatric:        Attention and Perception: Attention normal.        Mood and Affect: Mood normal.        Speech: Speech is tangential.        Behavior: Behavior is cooperative.     No results found for any visits on 02/03/22.  Assessment & Plan     Problem List Items Addressed This Visit       Cardiovascular and Mediastinum   Hypertensive urgency - Primary    Plain conversation with patient regarding the consequences of uncontrolled hypertension at this level. Concern for end organ damage, stroke.  Pt frequently interrupts and changes the subject. Does not seem concerned today. Advised if her vision changes worsen or continue, she should go to the ED. Any headaches, dizziness, slurred speech, weakness, should go to ED. Increase valsartan to 160 mg and f/u in 1 week.        Relevant Medications   valsartan (DIOVAN) 160 MG tablet     Digestive   Gastroesophageal reflux disease    Pt prefers omeprazole to pantoprazole. Rx sent      Relevant Medications   omeprazole (PRILOSEC) 20 MG capsule     Other   Generalized anxiety disorder    Will increase buspar to 5 mg bid. F/u 8 weeks      Relevant Medications   busPIRone (BUSPAR) 5 MG tablet   Leg cramps    Likely 2/2 dehydration, etoh use Will check cmp, b12, folate, mag      Relevant Orders   B12 and Folate Panel   Comprehensive Metabolic Panel (CMET)   Magnesium   Elevated  hemoglobin (HCC)    On review of labs from ED Likely 2/2 smoking Will repeat cbc      Relevant Orders   CBC w/Diff/Platelet     Return in about 1 week (around 02/10/2022) for hypertension.      I, Alfredia Ferguson, PA-C have reviewed all documentation for this visit. The documentation on  02/03/2022 for the exam, diagnosis, procedures, and orders are all accurate and complete.  Alfredia Ferguson, PA-C [facility] [address] [place], [place], [address] Office: [phone] Fax: [phone]   [facility]

## 2022-02-03 ENCOUNTER — Ambulatory Visit (INDEPENDENT_AMBULATORY_CARE_PROVIDER_SITE_OTHER): Payer: 59 | Admitting: Physician Assistant

## 2022-02-03 ENCOUNTER — Encounter: Payer: Self-pay | Admitting: Physician Assistant

## 2022-02-03 VITALS — BP 209/128 | HR 77 | Temp 98.1°F | Wt 216.6 lb

## 2022-02-03 DIAGNOSIS — R252 Cramp and spasm: Secondary | ICD-10-CM | POA: Diagnosis not present

## 2022-02-03 DIAGNOSIS — I160 Hypertensive urgency: Secondary | ICD-10-CM

## 2022-02-03 DIAGNOSIS — F411 Generalized anxiety disorder: Secondary | ICD-10-CM

## 2022-02-03 DIAGNOSIS — D582 Other hemoglobinopathies: Secondary | ICD-10-CM | POA: Diagnosis not present

## 2022-02-03 DIAGNOSIS — K219 Gastro-esophageal reflux disease without esophagitis: Secondary | ICD-10-CM

## 2022-02-03 MED ORDER — BUSPIRONE HCL 5 MG PO TABS: 5.0000 mg | ORAL_TABLET | Freq: Two times a day (BID) | ORAL | 1 refills | Status: DC

## 2022-02-03 MED ORDER — OMEPRAZOLE 20 MG PO CPDR: 20.0000 mg | DELAYED_RELEASE_CAPSULE | Freq: Every day | ORAL | 1 refills | Status: DC

## 2022-02-03 MED ORDER — VALSARTAN 160 MG PO TABS: 160.0000 mg | ORAL_TABLET | Freq: Every day | ORAL | 3 refills | Status: DC

## 2022-02-03 NOTE — Assessment & Plan Note (Signed)
Will increase buspar to 5 mg bid. F/u 8 weeks

## 2022-02-03 NOTE — Assessment & Plan Note (Addendum)
Plain conversation with patient regarding the consequences of uncontrolled hypertension at this level. Concern for end organ damage, stroke.  Pt frequently interrupts and changes the subject. Does not seem concerned today. Advised if her vision changes worsen or continue, she should go to the ED. Any headaches, dizziness, slurred speech, weakness, should go to ED. Increase valsartan to 160 mg and f/u in 1 week.

## 2022-02-03 NOTE — Assessment & Plan Note (Signed)
On review of labs from ED Likely 2/2 smoking Will repeat cbc

## 2022-02-03 NOTE — Assessment & Plan Note (Signed)
Pt prefers omeprazole to pantoprazole. Rx sent

## 2022-02-03 NOTE — Assessment & Plan Note (Signed)
Likely 2/2 dehydration, etoh use Will check cmp, b12, folate, mag

## 2022-02-04 LAB — CBC WITH DIFFERENTIAL/PLATELET
Basophils Absolute: 0.1 10*3/uL (ref 0.0–0.2)
Basos: 1 %
EOS (ABSOLUTE): 0.3 10*3/uL (ref 0.0–0.4)
Eos: 4 %
Hematocrit: 43.3 % (ref 34.0–46.6)
Hemoglobin: 14.5 g/dL (ref 11.1–15.9)
Immature Grans (Abs): 0 10*3/uL (ref 0.0–0.1)
Immature Granulocytes: 0 %
Lymphocytes Absolute: 1.4 10*3/uL (ref 0.7–3.1)
Lymphs: 19 %
MCH: 31.7 pg (ref 26.6–33.0)
MCHC: 33.5 g/dL (ref 31.5–35.7)
MCV: 95 fL (ref 79–97)
Monocytes Absolute: 0.6 10*3/uL (ref 0.1–0.9)
Monocytes: 9 %
Neutrophils Absolute: 4.9 10*3/uL (ref 1.4–7.0)
Neutrophils: 67 %
Platelets: 266 10*3/uL (ref 150–450)
RBC: 4.57 x10E6/uL (ref 3.77–5.28)
RDW: 13.2 % (ref 11.7–15.4)
WBC: 7.2 10*3/uL (ref 3.4–10.8)

## 2022-02-04 LAB — MAGNESIUM: Magnesium: 1.9 mg/dL (ref 1.6–2.3)

## 2022-02-04 LAB — COMPREHENSIVE METABOLIC PANEL
ALT: 15 IU/L (ref 0–32)
AST: 17 IU/L (ref 0–40)
Albumin/Globulin Ratio: 1.8 (ref 1.2–2.2)
Albumin: 4.2 g/dL (ref 3.8–4.9)
Alkaline Phosphatase: 97 IU/L (ref 44–121)
BUN/Creatinine Ratio: 23 (ref 9–23)
BUN: 21 mg/dL (ref 6–24)
Bilirubin Total: <0.2 mg/dL (ref 0.0–1.2)
CO2: 21 mmol/L (ref 20–29)
Calcium: 9.2 mg/dL (ref 8.7–10.2)
Chloride: 105 mmol/L (ref 96–106)
Creatinine, Ser: 0.93 mg/dL (ref 0.57–1.00)
Globulin, Total: 2.4 g/dL (ref 1.5–4.5)
Glucose: 102 mg/dL — ABNORMAL HIGH (ref 70–99)
Potassium: 4.7 mmol/L (ref 3.5–5.2)
Sodium: 142 mmol/L (ref 134–144)
Total Protein: 6.6 g/dL (ref 6.0–8.5)
eGFR: 73 mL/min/{1.73_m2} (ref >59)

## 2022-02-04 LAB — B12 AND FOLATE PANEL
Folate: 10.6 ng/mL (ref >3.0)
Vitamin B-12: 267 pg/mL (ref 232–1245)

## 2022-02-12 ENCOUNTER — Encounter: Payer: Self-pay | Admitting: Physician Assistant

## 2022-02-12 ENCOUNTER — Ambulatory Visit (INDEPENDENT_AMBULATORY_CARE_PROVIDER_SITE_OTHER): Payer: 59 | Admitting: Physician Assistant

## 2022-02-12 VITALS — BP 179/117 | HR 96 | Temp 97.9°F | Wt 212.7 lb

## 2022-02-12 DIAGNOSIS — I160 Hypertensive urgency: Secondary | ICD-10-CM | POA: Diagnosis not present

## 2022-02-12 MED ORDER — CLONIDINE HCL 0.1 MG PO TABS: 0.1000 mg | ORAL_TABLET | Freq: Two times a day (BID) | ORAL | 1 refills | Status: DC

## 2022-02-12 NOTE — Assessment & Plan Note (Signed)
No real change in BP yet with increase of valsartan to 160 mg  Adding clonidine 0.1 mg BID PRN 170>100  F/u in 2 weeks.  Again discussed the consequences of uncontrolled hypertension and concern for end organ damage, stroke. Gave ED precautions again.  If no improvement at follow up would refer to cardiology.

## 2022-02-12 NOTE — Progress Notes (Signed)
I,Connie R Striblin,acting as a Education administrator for [employer], PA-C.,have documented all relevant documentation on the behalf of Mikey Kirschner, PA-C,as directed by  Mikey Kirschner, PA-C while in the presence of Mikey Kirschner, PA-C.  Established patient visit   Patient: Tamara Hoffman   DOB: 1965/10/21   56 y.o. Female  MRN: [id number] Visit Date: 02/12/2022  Today's healthcare provider: Mikey Kirschner, PA-C   Cc. Htn f/u  Subjective    HPI  Hypertension, follow-up  BP Readings from Last 3 Encounters:  02/12/22 (!) 179/117  02/03/22 (!) 209/128  10/06/21 (!) 140/90   Wt Readings from Last 3 Encounters:  02/12/22 212 lb 11.2 oz (96.5 kg)  02/03/22 216 lb 9.6 oz (98.2 kg)  10/06/21 210 lb (95.3 kg)     She was last seen for hypertension 1 weeks ago.  BP at that visit was 209/128. Management since that visit includes increase valsartan to 179m  She reports excellent compliance with treatment. She is not having side effects.  She is following a Regular diet. She is exercising. She does smoke.  Use of agents associated with hypertension: none.   Outside blood pressures are ranging around:  202/111- 176/110 She reports some blurred vision in the morning and a dull headache.   Symptoms: No chest pain No chest pressure  No palpitations No syncope  Yes dyspnea No orthopnea  No paroxysmal nocturnal dyspnea No lower extremity edema   Pertinent labs Lab Results  Component Value Date   CHOL 189 03/06/2021   HDL 85 03/06/2021   LDLCALC 88 03/06/2021   TRIG 88 03/06/2021   CHOLHDL 2.2 03/06/2021   Lab Results  Component Value Date   NA 142 02/03/2022   K 4.7 02/03/2022   CREATININE 0.93 02/03/2022   EGFR 73 02/03/2022   GLUCOSE 102 (H) 02/03/2022   TSH 2.020 10/25/2018     The 10-year ASCVD risk score (Arnett DK, et al., 2019) is:  7.8%  ---------------------------------------------------------------------------------------------------   Medications: Outpatient Medications Prior to Visit  Medication Sig   albuterol (VENTOLIN HFA) 108 (90 Base) MCG/ACT inhaler Inhale 2 puffs into the lungs every 6 (six) hours as needed for wheezing or shortness of breath.   busPIRone (BUSPAR) 5 MG tablet Take 1 tablet (5 mg total) by mouth 2 (two) times daily.   celecoxib (CELEBREX) 100 MG capsule Take 100 mg by mouth 2 (two) times daily.   diphenhydrAMINE (BENADRYL) 25 mg capsule Take 25 mg by mouth every 6 (six) hours as needed.   Fluocinolone Acetonide Body 0.01 % OIL Apply topically.   folic acid (FOLVITE) 1 MG tablet Take 1 mg by mouth daily.   omeprazole (PRILOSEC) 20 MG capsule Take 1 capsule (20 mg total) by mouth daily.   predniSONE (DELTASONE) 5 MG tablet Take 5 mg by mouth daily with breakfast.   Tapinarof (VTAMA) 1 % CREA Apply 1 application topically daily.   triamcinolone ointment (KENALOG) 0.5 % Apply 1 application topically 2 (two) times daily.   valsartan (DIOVAN) 160 MG tablet Take 1 tablet (160 mg total) by mouth daily.   No facility-administered medications prior to visit.    Review of Systems  Constitutional:  Negative for fatigue and fever.  Eyes:  Positive for visual disturbance.  Respiratory:  Negative for cough and shortness of breath.   Cardiovascular:  Negative for chest pain and leg swelling.  Gastrointestinal:  Negative for abdominal pain.  Neurological:  Positive for headaches. Negative for dizziness.  Objective    Blood pressure (!) 179/117, pulse 96, temperature 97.9 F (36.6 C), temperature source Oral, weight 212 lb 11.2 oz (96.5 kg), SpO2 100 %.    Physical Exam Vitals reviewed.  Constitutional:      Appearance: She is not ill-appearing.  HENT:     Head: Normocephalic.  Eyes:     Conjunctiva/sclera: Conjunctivae normal.  Cardiovascular:     Rate and Rhythm: Normal rate.   Pulmonary:     Effort: Pulmonary effort is normal. No respiratory distress.  Neurological:     General: No focal deficit present.     Mental Status: She is alert and oriented to person, place, and time.  Psychiatric:        Mood and Affect: Mood normal.        Behavior: Behavior normal.      No results found for any visits on 02/12/22.  Assessment & Plan     Problem List Items Addressed This Visit       Cardiovascular and Mediastinum   Hypertensive urgency - Primary    No real change in BP yet with increase of valsartan to 160 mg  Adding clonidine 0.1 mg BID PRN 170>100  F/u in 2 weeks.  Again discussed the consequences of uncontrolled hypertension and concern for end organ damage, stroke. Gave ED precautions again.  If no improvement at follow up would refer to cardiology.      Relevant Medications   cloNIDine (CATAPRES) 0.1 MG tablet     Return in about 2 weeks (around 02/26/2022), or if symptoms worsen or fail to improve, for hypertension.      I, Mikey Kirschner, PA-C have reviewed all documentation for this visit. The documentation on  02/12/2022 for the exam, diagnosis, procedures, and orders are all accurate and complete.  Mikey Kirschner, PA-C [facility] [address] [place], [place], [address] Office: [phone] Fax: [place]

## 2022-02-25 NOTE — Progress Notes (Deleted)
Established patient visit   Patient: Tamara Hoffman   DOB: Jun 05, 1965   56 y.o. Female  MRN: [id number] Visit Date: 02/26/2022  Today's healthcare provider: Gwyneth Sprout, FNP   No chief complaint on file.  Subjective    HPI  Hypertension, follow-up  BP Readings from Last 3 Encounters:  02/12/22 (!) 179/117  02/03/22 (!) 209/128  10/06/21 (!) 140/90   Wt Readings from Last 3 Encounters:  02/12/22 212 lb 11.2 oz (96.5 kg)  02/03/22 216 lb 9.6 oz (98.2 kg)  10/06/21 210 lb (95.3 kg)     She was last seen for hypertension 2 weeks ago.  BP at that visit was 179/117. Management since that visit includes adding clonidine 0.1 mg BID PRN 170>100 .  She reports {excellent/good/fair/poor:19665} compliance with treatment. She {is/is not:9024} having side effects. {document side effects if present:1} She is following a {diet:21022986} diet. She {is/is not:9024} exercising. She {does/does not:200015} smoke.  Use of agents associated with hypertension: {bp agents assoc with hypertension:511::"none"}.   Outside blood pressures are {***enter patient reported home BP readings, or 'not being checked':1}. Symptoms: {Yes/No:20286} chest pain {Yes/No:20286} chest pressure  {Yes/No:20286} palpitations {Yes/No:20286} syncope  {Yes/No:20286} dyspnea {Yes/No:20286} orthopnea  {Yes/No:20286} paroxysmal nocturnal dyspnea {Yes/No:20286} lower extremity edema   Pertinent labs Lab Results  Component Value Date   CHOL 189 03/06/2021   HDL 85 03/06/2021   LDLCALC 88 03/06/2021   TRIG 88 03/06/2021   CHOLHDL 2.2 03/06/2021   Lab Results  Component Value Date   NA 142 02/03/2022   K 4.7 02/03/2022   CREATININE 0.93 02/03/2022   EGFR 73 02/03/2022   GLUCOSE 102 (H) 02/03/2022   TSH 2.020 10/25/2018     The 10-year ASCVD risk score (Arnett DK, et al., 2019) is: 7.8%  ---------------------------------------------------------------------------------------------------    Medications: Outpatient Medications Prior to Visit  Medication Sig   albuterol (VENTOLIN HFA) 108 (90 Base) MCG/ACT inhaler Inhale 2 puffs into the lungs every 6 (six) hours as needed for wheezing or shortness of breath.   busPIRone (BUSPAR) 5 MG tablet Take 1 tablet (5 mg total) by mouth 2 (two) times daily.   celecoxib (CELEBREX) 100 MG capsule Take 100 mg by mouth 2 (two) times daily.   cloNIDine (CATAPRES) 0.1 MG tablet Take 1 tablet (0.1 mg total) by mouth 2 (two) times daily. If BP is >170/100   diphenhydrAMINE (BENADRYL) 25 mg capsule Take 25 mg by mouth every 6 (six) hours as needed.   Fluocinolone Acetonide Body 0.01 % OIL Apply topically.   folic acid (FOLVITE) 1 MG tablet Take 1 mg by mouth daily.   omeprazole (PRILOSEC) 20 MG capsule Take 1 capsule (20 mg total) by mouth daily.   predniSONE (DELTASONE) 5 MG tablet Take 5 mg by mouth daily with breakfast.   Tapinarof (VTAMA) 1 % CREA Apply 1 application topically daily.   triamcinolone ointment (KENALOG) 0.5 % Apply 1 application topically 2 (two) times daily.   valsartan (DIOVAN) 160 MG tablet Take 1 tablet (160 mg total) by mouth daily.   No facility-administered medications prior to visit.    Review of Systems  {Labs  Heme  Chem  Endocrine  Serology  Results Review (optional):23779}   Objective    LMP  (LMP Unknown)  {Show previous vital signs (optional):23777}  Physical Exam  ***  No results found for any visits on 02/26/22.  Assessment & Plan     ***  No follow-ups on file.      {  provider attestation***:1}   Gwyneth Sprout, [place] [phone] (phone) [phone] (fax)  [place]

## 2022-02-26 ENCOUNTER — Ambulatory Visit: Payer: 59 | Admitting: Family Medicine

## 2022-03-25 ENCOUNTER — Encounter: Payer: Self-pay | Admitting: Physician Assistant

## 2022-04-03 ENCOUNTER — Encounter: Payer: Self-pay | Admitting: Physician Assistant

## 2022-04-25 ENCOUNTER — Other Ambulatory Visit: Payer: Self-pay | Admitting: Physician Assistant

## 2022-04-25 DIAGNOSIS — F411 Generalized anxiety disorder: Secondary | ICD-10-CM

## 2022-05-11 ENCOUNTER — Other Ambulatory Visit: Payer: Self-pay | Admitting: Physician Assistant

## 2022-05-11 DIAGNOSIS — F411 Generalized anxiety disorder: Secondary | ICD-10-CM

## 2022-05-12 NOTE — Telephone Encounter (Signed)
Letter sent via My Chart.

## 2022-05-13 ENCOUNTER — Ambulatory Visit: Payer: 59 | Admitting: Physician Assistant

## 2022-05-13 ENCOUNTER — Ambulatory Visit (INDEPENDENT_AMBULATORY_CARE_PROVIDER_SITE_OTHER): Payer: 59 | Admitting: Physician Assistant

## 2022-05-13 ENCOUNTER — Encounter: Payer: Self-pay | Admitting: Physician Assistant

## 2022-05-13 VITALS — BP 190/133 | HR 94 | Wt 215.3 lb

## 2022-05-13 DIAGNOSIS — F411 Generalized anxiety disorder: Secondary | ICD-10-CM | POA: Diagnosis not present

## 2022-05-13 DIAGNOSIS — R011 Cardiac murmur, unspecified: Secondary | ICD-10-CM | POA: Diagnosis not present

## 2022-05-13 DIAGNOSIS — R233 Spontaneous ecchymoses: Secondary | ICD-10-CM | POA: Diagnosis not present

## 2022-05-13 DIAGNOSIS — I160 Hypertensive urgency: Secondary | ICD-10-CM

## 2022-05-13 DIAGNOSIS — G4700 Insomnia, unspecified: Secondary | ICD-10-CM | POA: Diagnosis not present

## 2022-05-13 MED ORDER — VALSARTAN-HYDROCHLOROTHIAZIDE 320-12.5 MG PO TABS: 1.0000 | ORAL_TABLET | Freq: Every day | ORAL | 3 refills | Status: AC

## 2022-05-13 MED ORDER — HYDROXYZINE PAMOATE 25 MG PO CAPS: 25.0000 mg | ORAL_CAPSULE | Freq: Three times a day (TID) | ORAL | 0 refills | Status: DC | PRN

## 2022-05-13 NOTE — Progress Notes (Signed)
I,Sha'taria Tyson,acting as a Education administrator for [employer], PA-C.,have documented all relevant documentation on the behalf of Mikey Kirschner, PA-C,as directed by  Mikey Kirschner, PA-C while in the presence of Mikey Kirschner, PA-C.   Established patient visit   Patient: Tamara Hoffman   DOB: [birth year]   57 y.o. Female  MRN: [id number] Visit Date: 05/13/2022  Today's healthcare provider: Mikey Kirschner, PA-C   Cc. Htn, anxiety f/u  Subjective    HPI  Hypertension, follow-up  BP Readings from Last 3 Encounters:  05/13/22 (!) 190/133  02/12/22 (!) 179/117  02/03/22 (!) 209/128   Wt Readings from Last 3 Encounters:  05/13/22 215 lb 4.8 oz (97.7 kg)  02/12/22 212 lb 11.2 oz (96.5 kg)  02/03/22 216 lb 9.6 oz (98.2 kg)     She was last seen for hypertension 02/12/22, clonidine was added, and pt was advised to f/u in 2 weeks.  Outside blood pressures are checked every 3 days. Always high Symptoms: dizziness No chest pain No chest pressure  No palpitations No syncope  No dyspnea Yes orthopnea  No paroxysmal nocturnal dyspnea No lower extremity edema   ---------------------------------------------------------------------------------------------------  Anxiety, Follow-up   She feels her anxiety is moderate to severe and Unchanged since last visit. She typically takes buspar once daily. Reports trouble sleeping.  Symptoms: No chest pain Yes difficulty concentrating  Yes dizziness Yes fatigue  No feelings of losing control Yes insomnia  Yes irritable No palpitations  No panic attacks Yes racing thoughts  Yes shortness of breath No sweating  No tremors/shakes    GAD-7 Results    05/13/2022   10:07 AM 02/03/2022    8:18 AM  GAD-7 Generalized Anxiety Disorder Screening Tool  1. Feeling Nervous, Anxious, or on Edge 1 1  2. Not Being Able to Stop or Control Worrying 3 2  3. Worrying Too Much About Different Things  3  4. Trouble Relaxing 2 3  5. Being So Restless it's Hard  To Sit Still 1 0  6. Becoming Easily Annoyed or Irritable 3 0  7. Feeling Afraid As If Something Awful Might Happen 1 2  Total GAD-7 Score  11  Difficulty At Work, Home, or Getting  Along With Others? Somewhat difficult Not difficult at all    PHQ-9 Scores    05/13/2022   10:01 AM 02/03/2022    8:45 AM 07/21/2021    9:41 AM  PHQ9 SCORE ONLY  PHQ-9 Total Score '9 9 9    '$ ---------------------------------------------------------------------------------------------------  She is concerned how easily she bleeds/bruises. Reports small hits will cause large bruises that take a long time to go away Medications: Outpatient Medications Prior to Visit  Medication Sig   albuterol (VENTOLIN HFA) 108 (90 Base) MCG/ACT inhaler Inhale 2 puffs into the lungs every 6 (six) hours as needed for wheezing or shortness of breath.   busPIRone (BUSPAR) 5 MG tablet Take 1 tablet (5 mg total) by mouth 2 (two) times daily. Please make an appointment in office   celecoxib (CELEBREX) 100 MG capsule Take 100 mg by mouth 2 (two) times daily.   cloNIDine (CATAPRES) 0.1 MG tablet Take 1 tablet (0.1 mg total) by mouth 2 (two) times daily. If BP is >170/100   Fluocinolone Acetonide Body 0.01 % OIL Apply topically.   folic acid (FOLVITE) 1 MG tablet Take 1 mg by mouth daily.   omeprazole (PRILOSEC) 20 MG capsule Take 1 capsule (20 mg total) by mouth daily.   Tapinarof (VTAMA) 1 %  CREA Apply 1 application topically daily.   triamcinolone ointment (KENALOG) 0.5 % Apply 1 application topically 2 (two) times daily.   [DISCONTINUED] diphenhydrAMINE (BENADRYL) 25 mg capsule Take 25 mg by mouth every 6 (six) hours as needed.   [DISCONTINUED] valsartan (DIOVAN) 160 MG tablet Take 1 tablet (160 mg total) by mouth daily.   [DISCONTINUED] predniSONE (DELTASONE) 5 MG tablet Take 5 mg by mouth daily with breakfast. (Patient not taking: Reported on 05/13/2022)   No facility-administered medications prior to visit.    Review of  Systems  Constitutional:  Negative for fatigue and fever.  Respiratory:  Negative for cough and shortness of breath.   Cardiovascular:  Negative for chest pain and leg swelling.  Gastrointestinal:  Negative for abdominal pain.  Musculoskeletal:  Positive for arthralgias, joint swelling and myalgias.  Neurological:  Negative for dizziness and headaches.  Psychiatric/Behavioral:  Positive for sleep disturbance. The patient is nervous/anxious.      Objective    BP (!) 190/133 (BP Location: Right Arm, Patient Position: Sitting, Cuff Size: Normal) Comment: used patients home cuff  Pulse 94   Wt 215 lb 4.8 oz (97.7 kg)   LMP  (LMP Unknown)   BMI 36.96 kg/m  Blood pressure (!) 190/133, pulse 94, weight 215 lb 4.8 oz (97.7 kg).  Physical Exam Constitutional:      General: She is awake.     Appearance: She is well-developed.  HENT:     Head: Normocephalic.  Eyes:     Conjunctiva/sclera: Conjunctivae normal.  Cardiovascular:     Rate and Rhythm: Normal rate and regular rhythm.     Heart sounds: Murmur heard.  Pulmonary:     Effort: Pulmonary effort is normal.     Breath sounds: Normal breath sounds.  Skin:    General: Skin is warm.  Neurological:     Mental Status: She is alert and oriented to person, place, and time.  Psychiatric:        Attention and Perception: Attention normal.        Mood and Affect: Mood normal.        Speech: Speech normal.        Behavior: Behavior is cooperative.     No results found for any visits on 05/13/22.  Assessment & Plan     Problem List Items Addressed This Visit       Cardiovascular and Mediastinum   Hypertensive urgency - Primary    Advised pt this is a dangerous BP and she needs to follow up timely for care. Pt aware of ED recommendations for SE of vision changes, chest pain ,etc Changed valsartan 160 -- valsartan 320 + hctz 12.5 mg. Advised pt to f/u in 2 weeks. Stressed importance Also referred to cardiology for further management /  potential w/u of secondary HTN      Relevant Medications   valsartan-hydrochlorothiazide (DIOVAN-HCT) 320-12.5 MG tablet   Other Relevant Orders   Ambulatory referral to Cardiology     Other   Generalized anxiety disorder    Managed with buspar 5 mg 1-2 times daily.      Relevant Medications   hydrOXYzine (VISTARIL) 25 MG capsule   Insomnia    Pt reports trying trazodone and disliking. Rx hydroxyzine prn       Relevant Medications   hydrOXYzine (VISTARIL) 25 MG capsule   Heart murmur    Auscultated on exam today. I believe this is new.  Ref to cardiology      Relevant Orders  Ambulatory referral to Cardiology   Easy bruising    New pt per Will run coags, cbc, cmp      Relevant Orders   INR/PT   Comprehensive Metabolic Panel (CMET)   CBC w/Diff/Platelet    Return in about 2 weeks (around 05/27/2022) for hypertension.      I, Mikey Kirschner, PA-C have reviewed all documentation for this visit. The documentation on  05/13/22  for the exam, diagnosis, procedures, and orders are all accurate and complete.  Mikey Kirschner, PA-C [facility] [address] [place], [place], [address] Office: [phone] Fax: [place]

## 2022-05-13 NOTE — Assessment & Plan Note (Signed)
Managed with buspar 5 mg 1-2 times daily.

## 2022-05-13 NOTE — Assessment & Plan Note (Signed)
Advised pt this is a dangerous BP and she needs to follow up timely for care. Pt aware of ED recommendations for SE of vision changes, chest pain ,etc Changed valsartan 160 -- valsartan 320 + hctz 12.5 mg. Advised pt to f/u in 2 weeks. Stressed importance Also referred to cardiology for further management / potential w/u of secondary HTN

## 2022-05-13 NOTE — Assessment & Plan Note (Signed)
New pt per Will run coags, cbc, cmp

## 2022-05-13 NOTE — Assessment & Plan Note (Signed)
Auscultated on exam today. I believe this is new.  Ref to cardiology

## 2022-05-13 NOTE — Patient Instructions (Signed)
Please contact [phone] to schedule your mammogram. You will be asked your location preference to have procedure performed. You have two options listed below.  1) [facility] located at [address] [place], [place] [address] 2) [facility] [place] located at [address] [place], [place] [address]  Upon results being received our office will contact you. As well as all results can be viewed through your MyChart. Please feel free to contact us if you have any further questions or concerns.   

## 2022-05-13 NOTE — Assessment & Plan Note (Signed)
Pt reports trying trazodone and disliking. Rx hydroxyzine prn

## 2022-05-18 ENCOUNTER — Ambulatory Visit: Payer: Self-pay | Admitting: *Deleted

## 2022-05-18 NOTE — Telephone Encounter (Signed)
  Chief Complaint: rectal bleeding  Symptoms: rectal bleeding started 05/13/22. Noted in bottom of toilet bright red and mixed with non formed stool. Happened everyday until Saturday. No appetite tolerating drinking water. Reports difficulty swallowing. Eating broth . Abdominal pain comes and goes. Reports BP 114/90 HR 110-116  since starting valsartan. No BM since Saturday  Frequency: 05/13/22 Pertinent Negatives: Patient denies chest pain, no difficulty breathing no dizziness no lightheadedness, no constant abdominal pain  Disposition: [] ED /[] Urgent Care (no appt availability in office) / [x] Appointment(In office/virtual)/ []  [facility]/ [] Home Care/ [] Refused Recommended Disposition /[] [facility]/ []  Follow-up with PCP Additional Notes:   Appt scheduled for 05/19/22. Please advise if patient can be seen today . Recommended if sx return or sx worsen go to ED.     Reason for Disposition  MODERATE rectal bleeding (small blood clots, passing blood without stool, or toilet water turns red)  Answer Assessment - Initial Assessment Questions 1. APPEARANCE of BLOOD: "What color is it?" "Is it passed separately, on the surface of the stool, or mixed in with the stool?"      Noted in toilet and in bottom of toilet bright red on Wednesday and then  2. AMOUNT: "How much blood was passed?"      Noticed in bottom of toilet 3. FREQUENCY: "How many times has blood been passed with the stools?"      Every day since 05/13/22 4. ONSET: "When was the blood first seen in the stools?" (Days or weeks)      Wednesday 05/13/22 5. DIARRHEA: "Is there also some diarrhea?" If Yes, ask: "How many diarrhea stools in the past 24 hours?"      Nonformed stool 6. CONSTIPATION: "Do you have constipation?" If Yes, ask: "How bad is it?"     na 7. RECURRENT SYMPTOMS: "Have you had blood in your stools before?" If Yes, ask: "When was the last time?" and "What happened that time?"      No  8. BLOOD  THINNERS: "Do you take any blood thinners?" (e.g., Coumadin/warfarin, Pradaxa/dabigatran, aspirin)     no 9. OTHER SYMPTOMS: "Do you have any other symptoms?"  (e.g., abdomen pain, vomiting, dizziness, fever)     Abdominal pain not constant , no appetitie, fatigue difficulty swallowing . BP 114/90 HR 110-116 10. PREGNANCY: "Is there any chance you are pregnant?" "When was your last menstrual period?"       na  Protocols used: Rectal Bleeding-A-AH

## 2022-05-19 ENCOUNTER — Ambulatory Visit (INDEPENDENT_AMBULATORY_CARE_PROVIDER_SITE_OTHER): Payer: 59 | Admitting: Physician Assistant

## 2022-05-19 VITALS — BP 93/53 | HR 102

## 2022-05-19 DIAGNOSIS — R1013 Epigastric pain: Secondary | ICD-10-CM | POA: Diagnosis not present

## 2022-05-19 DIAGNOSIS — R42 Dizziness and giddiness: Secondary | ICD-10-CM | POA: Diagnosis not present

## 2022-05-19 DIAGNOSIS — K625 Hemorrhage of anus and rectum: Secondary | ICD-10-CM | POA: Diagnosis not present

## 2022-05-19 DIAGNOSIS — H539 Unspecified visual disturbance: Secondary | ICD-10-CM

## 2022-05-19 NOTE — Progress Notes (Unsigned)
     I,Sha'taria Tyson,acting as a Education administrator for [employer], PA-C.,have documented all relevant documentation on the behalf of Mikey Kirschner, PA-C,as directed by  Mikey Kirschner, PA-C while in the presence of Mikey Kirschner, PA-C.   Established patient visit   Patient: Tamara Hoffman   DOB: Mar 17, 1965   57 y.o. Female  MRN: [id number] Visit Date: 05/19/2022  Today's healthcare provider: Mikey Kirschner, PA-C   No chief complaint on file.  Subjective    Rectal Bleeding  The current episode started 5 to 7 days ago (05/13/22). The stool is described as mixed with blood. Prior successful therapies include laxatives and stool softeners. Associated symptoms include abdominal pain.  Patient has not had a movement since Saturday after taking pepto and ammodium. Patient reports this happened a few months ago as well.  Medications: Outpatient Medications Prior to Visit  Medication Sig   albuterol (VENTOLIN HFA) 108 (90 Base) MCG/ACT inhaler Inhale 2 puffs into the lungs every 6 (six) hours as needed for wheezing or shortness of breath.   busPIRone (BUSPAR) 5 MG tablet Take 1 tablet (5 mg total) by mouth 2 (two) times daily. Please make an appointment in office   celecoxib (CELEBREX) 100 MG capsule Take 100 mg by mouth 2 (two) times daily.   cloNIDine (CATAPRES) 0.1 MG tablet Take 1 tablet (0.1 mg total) by mouth 2 (two) times daily. If BP is >170/100   Fluocinolone Acetonide Body 0.01 % OIL Apply topically.   folic acid (FOLVITE) 1 MG tablet Take 1 mg by mouth daily.   hydrOXYzine (VISTARIL) 25 MG capsule Take 1 capsule (25 mg total) by mouth every 8 (eight) hours as needed.   omeprazole (PRILOSEC) 20 MG capsule Take 1 capsule (20 mg total) by mouth daily.   Tapinarof (VTAMA) 1 % CREA Apply 1 application topically daily.   triamcinolone ointment (KENALOG) 0.5 % Apply 1 application topically 2 (two) times daily.   valsartan-hydrochlorothiazide (DIOVAN-HCT) 320-12.5 MG tablet Take 1 tablet by mouth  daily.   No facility-administered medications prior to visit.    Review of Systems  Gastrointestinal:  Positive for abdominal pain and hematochezia.    {Labs  Heme  Chem  Endocrine  Serology  Results Review (optional):23779}   Objective    LMP  (LMP Unknown)  {Show previous vital signs (optional):23777}  Physical Exam  ***  No results found for any visits on 05/19/22.  Assessment & Plan     ***  No follow-ups on file.      {provider attestation***:1}   Mikey Kirschner, PA-C  [place] [phone] (phone) [phone] (fax)  [address]

## 2022-05-19 NOTE — Telephone Encounter (Signed)
Pt has appt scheduled today. 

## 2022-05-20 ENCOUNTER — Other Ambulatory Visit: Payer: Self-pay | Admitting: Physician Assistant

## 2022-05-20 ENCOUNTER — Encounter: Payer: Self-pay | Admitting: Physician Assistant

## 2022-05-20 ENCOUNTER — Ambulatory Visit
Admission: RE | Admit: 2022-05-20 | Discharge: 2022-05-20 | Disposition: A | Discharge status: Home / Self Care | Payer: 59 | Source: Ambulatory Visit | Attending: Physician Assistant | Admitting: Physician Assistant

## 2022-05-20 ENCOUNTER — Telehealth: Payer: Self-pay

## 2022-05-20 ENCOUNTER — Telehealth: Payer: Self-pay | Admitting: Gastroenterology

## 2022-05-20 DIAGNOSIS — N179 Acute kidney failure, unspecified: Secondary | ICD-10-CM

## 2022-05-20 DIAGNOSIS — R42 Dizziness and giddiness: Secondary | ICD-10-CM | POA: Insufficient documentation

## 2022-05-20 DIAGNOSIS — I6523 Occlusion and stenosis of bilateral carotid arteries: Secondary | ICD-10-CM | POA: Diagnosis not present

## 2022-05-20 DIAGNOSIS — R739 Hyperglycemia, unspecified: Secondary | ICD-10-CM

## 2022-05-20 LAB — CBC WITH DIFFERENTIAL/PLATELET
Basophils Absolute: 0.1 10*3/uL (ref 0.0–0.2)
Basos: 1 %
EOS (ABSOLUTE): 0.2 10*3/uL (ref 0.0–0.4)
Eos: 3 %
Hematocrit: 43.9 % (ref 34.0–46.6)
Hemoglobin: 14.7 g/dL (ref 11.1–15.9)
Immature Grans (Abs): 0 10*3/uL (ref 0.0–0.1)
Immature Granulocytes: 0 %
Lymphocytes Absolute: 1.7 10*3/uL (ref 0.7–3.1)
Lymphs: 23 %
MCH: 31.7 pg (ref 26.6–33.0)
MCHC: 33.5 g/dL (ref 31.5–35.7)
MCV: 95 fL (ref 79–97)
Monocytes Absolute: 0.9 10*3/uL (ref 0.1–0.9)
Monocytes: 12 %
Neutrophils Absolute: 4.6 10*3/uL (ref 1.4–7.0)
Neutrophils: 61 %
Platelets: 315 10*3/uL (ref 150–450)
RBC: 4.64 x10E6/uL (ref 3.77–5.28)
RDW: 12.8 % (ref 11.7–15.4)
WBC: 7.6 10*3/uL (ref 3.4–10.8)

## 2022-05-20 LAB — COMPREHENSIVE METABOLIC PANEL
ALT: 19 IU/L (ref 0–32)
AST: 17 IU/L (ref 0–40)
Albumin/Globulin Ratio: 1.5 (ref 1.2–2.2)
Albumin: 4.3 g/dL (ref 3.8–4.9)
Alkaline Phosphatase: 104 IU/L (ref 44–121)
BUN/Creatinine Ratio: 22 (ref 9–23)
BUN: 29 mg/dL — ABNORMAL HIGH (ref 6–24)
Bilirubin Total: <0.2 mg/dL (ref 0.0–1.2)
CO2: 22 mmol/L (ref 20–29)
Calcium: 9.8 mg/dL (ref 8.7–10.2)
Chloride: 100 mmol/L (ref 96–106)
Creatinine, Ser: 1.33 mg/dL — ABNORMAL HIGH (ref 0.57–1.00)
Globulin, Total: 2.9 g/dL (ref 1.5–4.5)
Glucose: 121 mg/dL — ABNORMAL HIGH (ref 70–99)
Potassium: 4.8 mmol/L (ref 3.5–5.2)
Sodium: 138 mmol/L (ref 134–144)
Total Protein: 7.2 g/dL (ref 6.0–8.5)
eGFR: 47 mL/min/{1.73_m2} — ABNORMAL LOW (ref >59)

## 2022-05-20 MED ORDER — ROSUVASTATIN CALCIUM 5 MG PO TABS: 5.0000 mg | ORAL_TABLET | Freq: Every day | ORAL | 3 refills | Status: AC

## 2022-05-20 NOTE — Telephone Encounter (Signed)
Copied from [place] [phone]. Topic: General - Call Back - No Documentation >> May 20, 2022  1:28 PM Everette C wrote: Reason for CRM: Please contact the patient when possible   The patient has returned two missed phone calls from the practice

## 2022-05-20 NOTE — Telephone Encounter (Signed)
Please advise for urgent appointment. Appointment diagnoses rectal bleeding. Please review patient chart. Thank you.

## 2022-05-21 NOTE — Telephone Encounter (Signed)
Monday morning 11 30 am

## 2022-05-21 NOTE — Telephone Encounter (Signed)
Spoke with patient advised I do not see any phone call encounters to the patient but due to recent referrals placed from our office as well as an upcoming appointment they could have been automated calls to advise of those. Patient verbalized understanding and wanted to make Ria Comment aware she was able to get in with GI sooner.

## 2022-05-21 NOTE — Telephone Encounter (Signed)
Okay, good to know I see appt 3/25

## 2022-05-25 ENCOUNTER — Ambulatory Visit: Payer: 59 | Admitting: Gastroenterology

## 2022-05-25 ENCOUNTER — Encounter: Payer: Self-pay | Admitting: Gastroenterology

## 2022-05-25 VITALS — BP 123/86 | HR 90 | Temp 98.4°F | Ht 64.0 in | Wt 211.5 lb

## 2022-05-25 DIAGNOSIS — K625 Hemorrhage of anus and rectum: Secondary | ICD-10-CM | POA: Diagnosis not present

## 2022-05-25 MED ORDER — NA SULFATE-K SULFATE-MG SULF 17.5-3.13-1.6 GM/177ML PO SOLN: 1.0000 | Freq: Once | ORAL | 0 refills | Status: AC

## 2022-05-25 NOTE — Progress Notes (Signed)
Jonathon Bellows MD, MRCP(U.K) [address]  [place]  [place], [place] [address]  Main: [phone]  Fax: [phone]   Gastroenterology Consultation  Referring Provider:     Mikey Kirschner, PA-C Primary Care Physician:  Mikey Kirschner, PA-C Primary Gastroenterologist:  Dr. Jonathon Bellows  Reason for Consultation:    Rectal bleeding         HPI:   Tamara Hoffman is a 57 y.o. y/o female referred for consultation & management  by  Mikey Kirschner, PA-C.     She has been referred for rectal bleeding . Previously had a colonoscopy scheduled for colon cancer screening which she cancelled. Hb 14.7 in 05/2022.  She says that a few weeks back she had dark blood in her stools on the tissue paper and in the toilet bowl associated with upper abdominal pain at that time she was taking a lot of BC powders.  Subsequently stopped taking the [facility] powders the abdominal pain as well as the blood in the stool resolved.  She has however noticed a change in the shape of her stool which is more flat shaped.  Denies any weight loss no prior colonoscopy she is a smoker no family history of colon cancer or polyps.   Past Medical History:  Diagnosis Date   Arthritis    GERD (gastroesophageal reflux disease)    Hypertension     Past Surgical History:  Procedure Laterality Date   LEEP      Prior to Admission medications   Medication Sig Start Date End Date Taking? Authorizing Provider  rosuvastatin (CRESTOR) 5 MG tablet Take 1 tablet (5 mg total) by mouth daily. 05/20/22   Mikey Kirschner, PA-C  albuterol (VENTOLIN HFA) 108 (90 Base) MCG/ACT inhaler Inhale 2 puffs into the lungs every 6 (six) hours as needed for wheezing or shortness of breath. 03/06/21   Mikey Kirschner, PA-C  busPIRone (BUSPAR) 5 MG tablet Take 1 tablet (5 mg total) by mouth 2 (two) times daily. Please make an appointment in office 05/12/22   Drubel, Ria Comment, PA-C  celecoxib (CELEBREX) 100 MG capsule Take 100 mg by mouth 2 (two) times  daily. 07/03/21   [provider]  cloNIDine (CATAPRES) 0.1 MG tablet Take 1 tablet (0.1 mg total) by mouth 2 (two) times daily. If BP is >170/100 02/12/22   Drubel, Ria Comment, PA-C  Fluocinolone Acetonide Body 0.01 % OIL Apply topically.    [provider]  folic acid (FOLVITE) 1 MG tablet Take 1 mg by mouth daily. 07/03/21   [provider]  hydrOXYzine (VISTARIL) 25 MG capsule Take 1 capsule (25 mg total) by mouth every 8 (eight) hours as needed. 05/13/22   Mikey Kirschner, PA-C  omeprazole (PRILOSEC) 20 MG capsule Take 1 capsule (20 mg total) by mouth daily. 02/03/22   Drubel, Ria Comment, PA-C  Tapinarof (VTAMA) 1 % CREA Apply 1 application topically daily. 03/24/21   Ralene Bathe, MD  triamcinolone ointment (KENALOG) 0.5 % Apply 1 application topically 2 (two) times daily. 03/20/21   Gwyneth Sprout, FNP  valsartan-hydrochlorothiazide (DIOVAN-HCT) 320-12.5 MG tablet Take 1 tablet by mouth daily. 05/13/22   Mikey Kirschner, PA-C    Family History  Problem Relation Age of Onset   Diabetes Mother    Heart Problems Mother    Heart failure Mother    Hypertension Father    Heart Problems Father    Coronary artery disease Father    Diabetes Sister    Heart murmur Sister  Cancer Maternal Grandmother    Heart murmur Sister      Social History   Tobacco Use   Smoking status: Every Day    Packs/day: 1.00    Years: 22.00    Additional pack years: 0.00    Total pack years: 22.00    Types: Cigarettes   Smokeless tobacco: Never  Vaping Use   Vaping Use: Some days  Substance Use Topics   Alcohol use: Yes    Alcohol/week: 5.0 - 7.0 standard drinks of alcohol    Types: 5 - 7 Cans of beer per week    Comment: now is almost every day/occasional   Drug use: Not Currently    Types: Marijuana    Comment: Last used 1 week ago.    Allergies as of 05/25/2022 - Review Complete 05/25/2022  Allergen Reaction Noted   Promethazine Swelling 04/08/2016   Promethazine hcl  Swelling 04/08/2016    Review of Systems:    All systems reviewed and negative except where noted in HPI.   Physical Exam:  BP 123/86   Pulse 90   Temp 98.4 F (36.9 C) (Oral)   Ht 5\' 4"  (1.626 m)   Wt 211 lb 8 oz (95.9 kg)   LMP  (LMP Unknown)   BMI 36.30 kg/m  No LMP recorded (lmp unknown). Patient is postmenopausal. Psych:  Alert and cooperative. Normal mood and affect. General:   Alert,  Well-developed, well-nourished, pleasant and cooperative in NAD Head:  Normocephalic and atraumatic. Eyes:  Sclera clear, no icterus.   Conjunctiva pink. Ears:  Normal auditory acuity. Neurologic:  Alert and oriented x3;  grossly normal neurologically. Psych:  Alert and cooperative. Normal mood and affect.  Imaging Studies: US Carotid Bilateral  Result Date: 05/20/2022 CLINICAL DATA:  Lightheadedness. Visual changes. History of hypertension and smoking. EXAM: BILATERAL CAROTID DUPLEX ULTRASOUND TECHNIQUE: Pearline Cables scale imaging, color Doppler and duplex ultrasound were performed of bilateral carotid and vertebral arteries in the neck. COMPARISON:  None Available. FINDINGS: Criteria: Quantification of carotid stenosis is based on velocity parameters that correlate the residual internal carotid diameter with NASCET-based stenosis levels, using the diameter of the distal internal carotid lumen as the denominator for stenosis measurement. The following velocity measurements were obtained: RIGHT ICA: 79/33 cm/sec CCA: [id number] cm/sec SYSTOLIC ICA/CCA RATIO:  1.3 ECA: 359 cm/sec LEFT ICA: 107/50 cm/sec CCA: [id number] cm/sec SYSTOLIC ICA/CCA RATIO:  1.7 ECA: 46 cm/sec RIGHT CAROTID ARTERY: There is a minimal amount of intimal thickening throughout the right common carotid artery (images 3, 7 and 11). There is a minimal amount of eccentric echogenic plaque within the right carotid bulb (image 15). There is a minimal to moderate amount of eccentric echogenic plaque involving the origin and proximal aspects of the right  internal carotid artery (image 22), not resulting in elevated peak systolic velocities within the interrogated course of the right internal carotid artery to suggest a hemodynamically significant stenosis. RIGHT VERTEBRAL ARTERY:  Antegrade flow LEFT CAROTID ARTERY: There is a minimal amount of intimal thickening an echogenic plaque scattered throughout the left common carotid artery (images 34, 38 and 42). There is a minimal amount of eccentric echogenic plaque within left carotid bulb (image 46). There is a minimal amount of hypoechoic plaque involving the origin and proximal aspects of the left internal carotid artery (image 53), not resulting in elevated peak systolic velocities within the interrogated course of the left internal carotid artery to suggest a hemodynamically significant stenosis. LEFT VERTEBRAL ARTERY:  Antegrade flow IMPRESSION:  Minimal to moderate amount of bilateral atherosclerotic plaque, right subjectively greater than left, not resulting in a hemodynamically significant stenosis within either internal carotid artery. Electronically Signed   By: Sandi Mariscal M.D.   On: 05/20/2022 13:41    Assessment and Plan:   Tamara Hoffman is a 57 y.o. y/o female has been referred for rectal bleeding.  Likely GI bleed secondary to use of NSAIDs but due to change in the shape of her stools would also require a colonoscopy  Plan  Colonoscopy and colonoscopy tomorrow advised to stop using BC powders.  I have discussed alternative options, risks & benefits,  which include, but are not limited to, bleeding, infection, perforation,respiratory complication & drug reaction.  The patient agrees with this plan & written consent will be obtained.     Follow up in as needed  Dr Jonathon Bellows MD,MRCP(U.K)

## 2022-05-26 ENCOUNTER — Ambulatory Visit: Payer: 59 | Admitting: Anesthesiology

## 2022-05-26 ENCOUNTER — Encounter: Payer: Self-pay | Admitting: Gastroenterology

## 2022-05-26 ENCOUNTER — Encounter
Admission: RE | Disposition: A | Discharge status: Home / Self Care | Payer: Self-pay | Source: Home / Self Care | Attending: Gastroenterology

## 2022-05-26 ENCOUNTER — Ambulatory Visit
Admission: RE | Admit: 2022-05-26 | Discharge: 2022-05-26 | Disposition: A | Discharge status: Home / Self Care | Payer: 59 | Attending: Gastroenterology | Admitting: Gastroenterology

## 2022-05-26 DIAGNOSIS — J449 Chronic obstructive pulmonary disease, unspecified: Secondary | ICD-10-CM | POA: Insufficient documentation

## 2022-05-26 DIAGNOSIS — F17210 Nicotine dependence, cigarettes, uncomplicated: Secondary | ICD-10-CM | POA: Diagnosis not present

## 2022-05-26 DIAGNOSIS — K921 Melena: Secondary | ICD-10-CM | POA: Diagnosis not present

## 2022-05-26 DIAGNOSIS — Z6836 Body mass index (BMI) 36.0-36.9, adult: Secondary | ICD-10-CM | POA: Diagnosis not present

## 2022-05-26 DIAGNOSIS — K2970 Gastritis, unspecified, without bleeding: Secondary | ICD-10-CM | POA: Diagnosis not present

## 2022-05-26 DIAGNOSIS — M1990 Unspecified osteoarthritis, unspecified site: Secondary | ICD-10-CM | POA: Insufficient documentation

## 2022-05-26 DIAGNOSIS — K253 Acute gastric ulcer without hemorrhage or perforation: Secondary | ICD-10-CM | POA: Diagnosis not present

## 2022-05-26 DIAGNOSIS — I10 Essential (primary) hypertension: Secondary | ICD-10-CM | POA: Diagnosis not present

## 2022-05-26 DIAGNOSIS — E669 Obesity, unspecified: Secondary | ICD-10-CM | POA: Insufficient documentation

## 2022-05-26 DIAGNOSIS — K219 Gastro-esophageal reflux disease without esophagitis: Secondary | ICD-10-CM | POA: Diagnosis not present

## 2022-05-26 DIAGNOSIS — K625 Hemorrhage of anus and rectum: Secondary | ICD-10-CM

## 2022-05-26 DIAGNOSIS — K259 Gastric ulcer, unspecified as acute or chronic, without hemorrhage or perforation: Secondary | ICD-10-CM | POA: Diagnosis not present

## 2022-05-26 DIAGNOSIS — Z79899 Other long term (current) drug therapy: Secondary | ICD-10-CM | POA: Diagnosis not present

## 2022-05-26 DIAGNOSIS — K319 Disease of stomach and duodenum, unspecified: Secondary | ICD-10-CM | POA: Diagnosis not present

## 2022-05-26 HISTORY — PX: ESOPHAGOGASTRODUODENOSCOPY (EGD) WITH PROPOFOL: SHX5813

## 2022-05-26 HISTORY — DX: Cardiac murmur, unspecified: R01.1

## 2022-05-26 HISTORY — PX: COLONOSCOPY WITH PROPOFOL: SHX5780

## 2022-05-26 SURGERY — ESOPHAGOGASTRODUODENOSCOPY (EGD) WITH PROPOFOL
Anesthesia: General

## 2022-05-26 MED ORDER — SODIUM CHLORIDE 0.9 % IV SOLN: INTRAVENOUS | Status: DC

## 2022-05-26 MED ORDER — PHENYLEPHRINE 80 MCG/ML (10ML) SYRINGE FOR IV PUSH (FOR BLOOD PRESSURE SUPPORT)
PREFILLED_SYRINGE | INTRAVENOUS | Status: AC
  Filled 2022-05-26: qty 10

## 2022-05-26 MED ORDER — PROPOFOL 500 MG/50ML IV EMUL
INTRAVENOUS | Status: DC | PRN
  Administered 2022-05-26: 150 ug/kg/min via INTRAVENOUS

## 2022-05-26 MED ORDER — PHENYLEPHRINE 80 MCG/ML (10ML) SYRINGE FOR IV PUSH (FOR BLOOD PRESSURE SUPPORT)
PREFILLED_SYRINGE | INTRAVENOUS | Status: DC | PRN
  Administered 2022-05-26: 160 ug via INTRAVENOUS

## 2022-05-26 MED ORDER — ESMOLOL HCL 100 MG/10ML IV SOLN
INTRAVENOUS | Status: AC
  Filled 2022-05-26: qty 10

## 2022-05-26 MED ORDER — LIDOCAINE HCL (CARDIAC) PF 100 MG/5ML IV SOSY
PREFILLED_SYRINGE | INTRAVENOUS | Status: DC | PRN
  Administered 2022-05-26: 100 mg via INTRAVENOUS

## 2022-05-26 MED ORDER — PROPOFOL 10 MG/ML IV BOLUS
INTRAVENOUS | Status: DC | PRN
  Administered 2022-05-26: 70 mg via INTRAVENOUS

## 2022-05-26 MED ORDER — GLYCOPYRROLATE 0.2 MG/ML IJ SOLN
INTRAMUSCULAR | Status: DC | PRN
  Administered 2022-05-26: .2 mg via INTRAVENOUS

## 2022-05-26 NOTE — Anesthesia Procedure Notes (Signed)
Procedure Name: MAC Date/Time: 05/26/2022 10:23 AM  Performed by: Tollie Eth, CRNAPre-anesthesia Checklist: Patient identified, Emergency Drugs available, Suction available and Patient being monitored Patient Re-evaluated:Patient Re-evaluated prior to induction Oxygen Delivery Method: Simple face mask Induction Type: IV induction Placement Confirmation: positive ETCO2

## 2022-05-26 NOTE — Anesthesia Preprocedure Evaluation (Addendum)
Anesthesia Evaluation  Patient identified by MRN, date of birth, ID band Patient awake    Reviewed: Allergy & Precautions, H&P , NPO status , Patient's Chart, lab work & pertinent test results  Airway Mallampati: III  TM Distance: >3 FB Neck ROM: full    Dental  (+) Missing   Pulmonary shortness of breath and with exertion, COPD, Current SmokerPatient did not abstain from smoking.   Pulmonary exam normal        Cardiovascular hypertension, (-) angina Normal cardiovascular exam     Neuro/Psych  PSYCHIATRIC DISORDERS      negative neurological ROS     GI/Hepatic ,GERD  Controlled and Medicated,,(+)     substance abuse  alcohol use  Endo/Other  negative endocrine ROS    Renal/GU negative Renal ROS  negative genitourinary   Musculoskeletal  (+) Arthritis ,    Abdominal  (+) + obese  Peds  Hematology negative hematology ROS (+)   Anesthesia Other Findings Rectal bleeding  Past Medical History: No date: Arthritis No date: GERD (gastroesophageal reflux disease) No date: Hypertension  Past Surgical History: No date: LEEP     Reproductive/Obstetrics negative OB ROS                             Anesthesia Physical Anesthesia Plan  ASA: 3  Anesthesia Plan: General   Post-op Pain Management: Minimal or no pain anticipated   Induction: Intravenous  PONV Risk Score and Plan: Propofol infusion and TIVA  Airway Management Planned: Natural Airway  Additional Equipment:   Intra-op Plan:   Post-operative Plan:   Informed Consent: I have reviewed the patients History and Physical, chart, labs and discussed the procedure including the risks, benefits and alternatives for the proposed anesthesia with the patient or authorized representative who has indicated his/her understanding and acceptance.     Dental Advisory Given  Plan Discussed with: CRNA and Surgeon  Anesthesia Plan  Comments:         Anesthesia Quick Evaluation

## 2022-05-26 NOTE — Transfer of Care (Signed)
Immediate Anesthesia Transfer of Care Note  Patient: Tamara Hoffman  Procedure(s) Performed: ESOPHAGOGASTRODUODENOSCOPY (EGD) WITH PROPOFOL COLONOSCOPY WITH PROPOFOL  Patient Location: Endoscopy Unit  Anesthesia Type:General  Level of Consciousness: awake and alert   Airway & Oxygen Therapy: Patient Spontanous Breathing  Post-op Assessment: Report given to RN and Post -op Vital signs reviewed and stable  Post vital signs: Reviewed and stable  Last Vitals:  Vitals Value Taken Time  BP 113/73 05/26/22 1051  Temp 36.4 C 05/26/22 1051  Pulse 118 05/26/22 1051  Resp 24 05/26/22 1051  SpO2 99 % 05/26/22 1051  Vitals shown include unvalidated device data.  Last Pain:  Vitals:   05/26/22 1051  TempSrc: Temporal         Complications: No notable events documented.

## 2022-05-26 NOTE — Op Note (Signed)
[facility] Gastroenterology Patient Name: Tamara Hoffman Procedure Date: 05/26/2022 10:25 AM MRN: [id number] Account #: [id number] Date of Birth: 1965-03-22 Admit Type: Outpatient Age: 57 Room: [facility] ENDO ROOM 2 Gender: Female Note Status: Finalized Instrument Name: Tamara Hoffman [id number] Procedure:             Colonoscopy Indications:           Melena Providers:             Jonathon Bellows MD, MD Referring MD:          No Local Md, MD (Referring MD) Medicines:             Monitored Anesthesia Care Complications:         No immediate complications. Procedure:             Pre-Anesthesia Assessment:                        - Prior to the procedure, a History and Physical was                         performed, and patient medications, allergies and                         sensitivities were reviewed. The patient's tolerance                         of previous anesthesia was reviewed.                        - The risks and benefits of the procedure and the                         sedation options and risks were discussed with the                         patient. All questions were answered and informed                         consent was obtained.                        - ASA Grade Assessment: II - A patient with mild                         systemic disease.                        After obtaining informed consent, the colonoscope was                         passed under direct vision. Throughout the procedure,                         the patient's blood pressure, pulse, and oxygen                         saturations were monitored continuously. The                         Colonoscope was introduced through the anus  and                         advanced to the the cecum, identified by the                         appendiceal orifice. The colonoscopy was performed                         with ease. The patient tolerated the procedure well.                         The quality of  the bowel preparation was excellent.                         The ileocecal valve, appendiceal orifice, and rectum                         were photographed. Findings:      The perianal and digital rectal examinations were normal.      The entire examined colon appeared normal on direct and retroflexion       views. Impression:            - The entire examined colon is normal on direct and                         retroflexion views.                        - No specimens collected. Recommendation:        - Discharge patient to home (with escort).                        - Resume previous diet.                        - Continue present medications.                        - Repeat colonoscopy in 10 years for screening                         purposes. Procedure Code(s):     --- Professional ---                        [phone], Colonoscopy, flexible; diagnostic, including                         collection of specimen(s) by brushing or washing, when                         performed (separate procedure) Diagnosis Code(s):     --- Professional ---                        K92.1, Melena (includes Hematochezia) CPT copyright 2022 American Medical Association. All rights reserved. The codes documented in this report are preliminary and upon coder review may  be revised to meet current compliance requirements. Jonathon Bellows, MD Jonathon Bellows MD, MD 05/26/2022 10:47:46 AM This report has been signed electronically. Number  of Addenda: 0 Note Initiated On: 05/26/2022 10:25 AM Scope Withdrawal Time: 0 hours 8 minutes 28 seconds  Total Procedure Duration: 0 hours 10 minutes 55 seconds  Estimated Blood Loss:  Estimated blood loss: none.      [facility]

## 2022-05-26 NOTE — H&P (Signed)
Jonathon Bellows, MD [address], [place], [place], [place], [address] [address], [place], [place], [place], [address] Phone: [phone]  Fax: [phone]  Primary Care Physician:  Mikey Kirschner, PA-C   Pre-Procedure History & Physical: HPI:  Tamara Hoffman is a 57 y.o. female is here for an endoscopy and colonoscopy    Past Medical History:  Diagnosis Date   Arthritis    GERD (gastroesophageal reflux disease)    Heart murmur    Hypertension     Past Surgical History:  Procedure Laterality Date   LEEP      Prior to Admission medications   Medication Sig Start Date End Date Taking? Authorizing Provider  albuterol (VENTOLIN HFA) 108 (90 Base) MCG/ACT inhaler Inhale 2 puffs into the lungs every 6 (six) hours as needed for wheezing or shortness of breath. 03/06/21  Yes Drubel, Ria Comment, PA-C  busPIRone (BUSPAR) 5 MG tablet Take 1 tablet (5 mg total) by mouth 2 (two) times daily. Please make an appointment in office 05/12/22  Yes Drubel, Ria Comment, PA-C  celecoxib (CELEBREX) 100 MG capsule Take 100 mg by mouth 2 (two) times daily. 07/03/21  Yes [provider]  omeprazole (PRILOSEC) 20 MG capsule Take 1 capsule (20 mg total) by mouth daily. 02/03/22  Yes Drubel, Ria Comment, PA-C  valsartan-hydrochlorothiazide (DIOVAN-HCT) 320-12.5 MG tablet Take 1 tablet by mouth daily. 05/13/22  Yes Mikey Kirschner, PA-C  Fluocinolone Acetonide Body 0.01 % OIL Apply topically.    [provider]  folic acid (FOLVITE) 1 MG tablet Take 1 mg by mouth daily. 07/03/21   [provider]  hydrOXYzine (VISTARIL) 25 MG capsule Take 1 capsule (25 mg total) by mouth every 8 (eight) hours as needed. 05/13/22   Mikey Kirschner, PA-C  rosuvastatin (CRESTOR) 5 MG tablet Take 1 tablet (5 mg total) by mouth daily. 05/20/22   Drubel, Ria Comment, PA-C  Tapinarof (VTAMA) 1 % CREA Apply 1 application topically daily. 03/24/21   Ralene Bathe, MD  triamcinolone ointment (KENALOG) 0.5 % Apply 1  application topically 2 (two) times daily. 03/20/21   Gwyneth Sprout, FNP    Allergies as of 05/25/2022 - Review Complete 05/25/2022  Allergen Reaction Noted   Promethazine Swelling 04/08/2016   Promethazine hcl Swelling 04/08/2016    Family History  Problem Relation Age of Onset   Diabetes Mother    Heart Problems Mother    Heart failure Mother    Hypertension Father    Heart Problems Father    Coronary artery disease Father    Diabetes Sister    Heart murmur Sister    Cancer Maternal Grandmother    Heart murmur Sister     Social History   Socioeconomic History   Marital status: Divorced    Spouse name: Not on file   Number of children: Not on file   Years of education: Not on file   Highest education level: GED or equivalent  Occupational History   Not on file  Tobacco Use   Smoking status: Every Day    Packs/day: 1.00    Years: 22.00    Additional pack years: 0.00    Total pack years: 22.00    Types: Cigarettes   Smokeless tobacco: Never  Vaping Use   Vaping Use: Every day  Substance and Sexual Activity   Alcohol use: Yes    Alcohol/week: 5.0 - 7.0 standard drinks of alcohol    Types: 5 - 7 Cans of beer per week  Comment: now is almost every day/occasional   Drug use: Not Currently    Types: Marijuana    Comment: Last used 1 week ago.   Sexual activity: Not on file  Other Topics Concern   Not on file  Social History Narrative   Independent at baseline. Son lives with her.   Social Determinants of Health   Financial Resource Strain: Low Risk  (05/24/2022)   Overall Financial Resource Strain (CARDIA)    Difficulty of Paying Living Expenses: Not very hard  Food Insecurity: Food Insecurity Present (05/24/2022)   Hunger Vital Sign    Worried About Running Out of Food in the Last Year: Sometimes true    Ran Out of Food in the Last Year: Never true  Transportation Needs: No Transportation Needs (05/24/2022)   PRAPARE - Radiographer, therapeutic (Medical): No    Lack of Transportation (Non-Medical): No  Physical Activity: Sufficiently Active (05/24/2022)   Exercise Vital Sign    Days of Exercise per Week: 5 days    Minutes of Exercise per Session: 150+ min  Stress: No Stress Concern Present (05/24/2022)   Justice    Feeling of Stress : Only a little  Social Connections: Socially Isolated (05/24/2022)   Social Connection and Isolation Panel [NHANES]    Frequency of Communication with Friends and Family: Three times a week    Frequency of Social Gatherings with Friends and Family: Once a week    Attends Religious Services: Never    Marine scientist or Organizations: No    Attends Music therapist: Not on file    Marital Status: Divorced  Human resources officer Violence: Not on file    Review of Systems: See HPI, otherwise negative ROS  Physical Exam: BP (!) 123/100   Pulse (!) 113   Temp 97.8 F (36.6 C) (Temporal)   Resp 20   Wt 93.4 kg   LMP  (LMP Unknown)   SpO2 97%   BMI 35.36 kg/m  General:   Alert,  pleasant and cooperative in NAD Head:  Normocephalic and atraumatic. Neck:  Supple; no masses or thyromegaly. Lungs:  Clear throughout to auscultation, normal respiratory effort.    Heart:  +S1, +S2, Regular rate and rhythm, No edema. Abdomen:  Soft, nontender and nondistended. Normal bowel sounds, without guarding, and without rebound.   Neurologic:  Alert and  oriented x4;  grossly normal neurologically.  Impression/Plan: Tamara Hoffman is here for an endoscopy and colonoscopy  to be performed for  evaluation of melena    Risks, benefits, limitations, and alternatives regarding endoscopy have been reviewed with the patient.  Questions have been answered.  All parties agreeable.   Jonathon Bellows, MD  05/26/2022, 10:17 AM

## 2022-05-26 NOTE — Op Note (Signed)
[facility] Gastroenterology Patient Name: Tamara Hoffman Procedure Date: 05/26/2022 10:26 AM MRN: [id number] Account #: [id number] Date of Birth: [birth year] Admit Type: Outpatient Age: 57 Room: [facility] ENDO ROOM 2 Gender: Female Note Status: Finalized Instrument Name: Upper Endoscope [id number] Procedure:             Upper GI endoscopy Indications:           Melena Providers:             Jonathon Bellows MD, MD Referring MD:          No Local Md, MD (Referring MD) Medicines:             Monitored Anesthesia Care Complications:         No immediate complications. Procedure:             Pre-Anesthesia Assessment:                        - Prior to the procedure, a History and Physical was                         performed, and patient medications, allergies and                         sensitivities were reviewed. The patient's tolerance                         of previous anesthesia was reviewed.                        - The risks and benefits of the procedure and the                         sedation options and risks were discussed with the                         patient. All questions were answered and informed                         consent was obtained.                        - ASA Grade Assessment: II - A patient with mild                         systemic disease.                        After obtaining informed consent, the endoscope was                         passed under direct vision. Throughout the procedure,                         the patient's blood pressure, pulse, and oxygen                         saturations were monitored continuously. The Endoscope                         was introduced  through the mouth, and advanced to the                         third part of duodenum. The upper GI endoscopy was                         accomplished with ease. The patient tolerated the                         procedure well. Findings:      The esophagus was normal.       The examined duodenum was normal.      Three non-bleeding superficial gastric ulcers with a clean ulcer base       (Forrest Class III) were found on the greater curvature of the gastric       antrum. The largest lesion was 8 mm in largest dimension. Biopsies were       taken with a cold forceps for histology.      The cardia and gastric fundus were normal on retroflexion. Impression:            - Normal esophagus.                        - Normal examined duodenum.                        - Non-bleeding gastric ulcers with a clean ulcer base                         (Forrest Class III). Biopsied. Recommendation:        - Await pathology results.                        - Use Prilosec (omeprazole) 40 mg PO BID for 3 months.                        - Repeat upper endoscopy in 2 months to evaluate the                         response to therapy.                        - Perform a colonoscopy today. Procedure Code(s):     --- Professional ---                        [id number], Esophagogastroduodenoscopy, flexible,                         transoral; with biopsy, single or multiple Diagnosis Code(s):     --- Professional ---                        K25.9, Gastric ulcer, unspecified as acute or chronic,                         without hemorrhage or perforation                        K92.1, Melena (includes Hematochezia) CPT copyright 2022 American Medical Association. All rights  reserved. The codes documented in this report are preliminary and upon coder review may  be revised to meet current compliance requirements. Jonathon Bellows, MD Jonathon Bellows MD, MD 05/26/2022 10:34:48 AM This report has been signed electronically. Number of Addenda: 0 Note Initiated On: 05/26/2022 10:26 AM Estimated Blood Loss:  Estimated blood loss: none.      [facility]

## 2022-05-27 ENCOUNTER — Encounter: Payer: Self-pay | Admitting: Gastroenterology

## 2022-05-27 LAB — SURGICAL PATHOLOGY

## 2022-05-27 NOTE — Progress Notes (Deleted)
      Established patient visit   Patient: Tamara Hoffman   DOB: 1965/10/09   57 y.o. Female  MRN: [id number] Visit Date: 05/28/2022  Today's healthcare provider: Mikey Kirschner, PA-C   No chief complaint on file.  Subjective    HPI  Follow up for Rectal Bleeding:  The patient was last seen for this {NUMBERS 1-12:18279} {days/wks/mos/yrs:310907} ago. Changes made at last visit include ***.  She reports {excellent/good/fair/poor:19665} compliance with treatment. She feels that condition is {improved/worse/unchanged:3041574}. She {is/is not:21021397} having side effects. ***  -----------------------------------------------------------------------------------------   Medications: Outpatient Medications Prior to Visit  Medication Sig   albuterol (VENTOLIN HFA) 108 (90 Base) MCG/ACT inhaler Inhale 2 puffs into the lungs every 6 (six) hours as needed for wheezing or shortness of breath.   busPIRone (BUSPAR) 5 MG tablet Take 1 tablet (5 mg total) by mouth 2 (two) times daily. Please make an appointment in office   celecoxib (CELEBREX) 100 MG capsule Take 100 mg by mouth 2 (two) times daily.   Fluocinolone Acetonide Body 0.01 % OIL Apply topically.   folic acid (FOLVITE) 1 MG tablet Take 1 mg by mouth daily.   hydrOXYzine (VISTARIL) 25 MG capsule Take 1 capsule (25 mg total) by mouth every 8 (eight) hours as needed.   omeprazole (PRILOSEC) 20 MG capsule Take 1 capsule (20 mg total) by mouth daily.   rosuvastatin (CRESTOR) 5 MG tablet Take 1 tablet (5 mg total) by mouth daily.   Tapinarof (VTAMA) 1 % CREA Apply 1 application topically daily.   triamcinolone ointment (KENALOG) 0.5 % Apply 1 application topically 2 (two) times daily.   valsartan-hydrochlorothiazide (DIOVAN-HCT) 320-12.5 MG tablet Take 1 tablet by mouth daily.   No facility-administered medications prior to visit.    Review of Systems  {Labs  Heme  Chem  Endocrine  Serology  Results Review (optional):23779}    Objective    LMP  (LMP Unknown)  {Show previous vital signs (optional):23777}  Physical Exam  ***  No results found for any visits on 05/28/22.  Assessment & Plan     ***  No follow-ups on file.      {provider attestation***:1}   Mikey Kirschner, PA-C  [place] [phone] (phone) [phone] (fax)  [place]

## 2022-05-27 NOTE — Anesthesia Postprocedure Evaluation (Signed)
Anesthesia Post Note  Patient: Tamara Hoffman  Procedure(s) Performed: ESOPHAGOGASTRODUODENOSCOPY (EGD) WITH PROPOFOL COLONOSCOPY WITH PROPOFOL  Patient location during evaluation: PACU Anesthesia Type: General Level of consciousness: awake and alert Pain management: pain level controlled Vital Signs Assessment: post-procedure vital signs reviewed and stable Respiratory status: spontaneous breathing, nonlabored ventilation and respiratory function stable Cardiovascular status: blood pressure returned to baseline and stable Postop Assessment: no apparent nausea or vomiting Anesthetic complications: no   No notable events documented.   Last Vitals:  Vitals:   05/26/22 1111 05/26/22 1112  BP: (!) 140/103 132/79  Pulse: (!) 105 (!) 105  Resp: 16 16  Temp:    SpO2: 93% 93%    Last Pain:  Vitals:   05/27/22 0729  TempSrc:   PainSc: 0-No pain                 [place] Ouch

## 2022-05-28 ENCOUNTER — Ambulatory Visit: Payer: 59 | Admitting: Physician Assistant

## 2022-05-28 ENCOUNTER — Encounter: Payer: Self-pay | Admitting: Gastroenterology

## 2022-05-28 ENCOUNTER — Ambulatory Visit (INDEPENDENT_AMBULATORY_CARE_PROVIDER_SITE_OTHER): Payer: 59 | Admitting: Physician Assistant

## 2022-05-28 VITALS — BP 123/85 | HR 92 | Temp 98.1°F | Resp 13 | Ht 64.0 in | Wt 210.6 lb

## 2022-05-28 DIAGNOSIS — K219 Gastro-esophageal reflux disease without esophagitis: Secondary | ICD-10-CM | POA: Diagnosis not present

## 2022-05-28 DIAGNOSIS — I10 Essential (primary) hypertension: Secondary | ICD-10-CM

## 2022-05-28 DIAGNOSIS — F1090 Alcohol use, unspecified, uncomplicated: Secondary | ICD-10-CM

## 2022-05-28 MED ORDER — OMEPRAZOLE 20 MG PO CPDR: 20.0000 mg | DELAYED_RELEASE_CAPSULE | Freq: Two times a day (BID) | ORAL | 1 refills | Status: AC

## 2022-05-28 NOTE — Assessment & Plan Note (Signed)
Pt seen to have gastritis on endoscopy; d/c bc powders, increased omeprazole to 20 bid

## 2022-05-28 NOTE — Assessment & Plan Note (Signed)
Pt reports being sober 3 weeks.

## 2022-05-28 NOTE — Progress Notes (Signed)
I,J'ya E Hunter,acting as a scribe for [employer], PA-C.,have documented all relevant documentation on the behalf of Mikey Kirschner, PA-C,as directed by  Mikey Kirschner, PA-C while in the presence of Mikey Kirschner, PA-C.   Established patient visit   Patient: Tamara Hoffman   DOB: 02/25/1966   57 y.o. Female  MRN: [id number] Visit Date: 05/28/2022  Today's healthcare provider: Mikey Kirschner, PA-C   Cc. Htn f/u  Subjective    HPI  Hypertension, follow-up  BP Readings from Last 3 Encounters:  05/28/22 123/85  05/26/22 132/79  05/25/22 123/86   Wt Readings from Last 3 Encounters:  05/28/22 210 lb 9.6 oz (95.5 kg)  05/26/22 206 lb (93.4 kg)  05/25/22 211 lb 8 oz (95.9 kg)     She was last seen for hypertension 1 week ago. BP meds were increased to valsartan 320 mg hctz 12.5 mg    Outside blood pressures are not being checked  Denies headache, abdominal pain, dizziness, chest pain.  Pertinent labs Lab Results  Component Value Date   CHOL 189 03/06/2021   HDL 85 03/06/2021   LDLCALC 88 03/06/2021   TRIG 88 03/06/2021   CHOLHDL 2.2 03/06/2021   Lab Results  Component Value Date   NA 138 05/19/2022   K 4.8 05/19/2022   CREATININE 1.33 (H) 05/19/2022   EGFR 47 (L) 05/19/2022   GLUCOSE 121 (H) 05/19/2022   TSH 2.020 10/25/2018     The 10-year ASCVD risk score (Arnett DK, et al., 2019) is: 3.8%  Medications: Outpatient Medications Prior to Visit  Medication Sig   albuterol (VENTOLIN HFA) 108 (90 Base) MCG/ACT inhaler Inhale 2 puffs into the lungs every 6 (six) hours as needed for wheezing or shortness of breath.   busPIRone (BUSPAR) 5 MG tablet Take 1 tablet (5 mg total) by mouth 2 (two) times daily. Please make an appointment in office   celecoxib (CELEBREX) 100 MG capsule Take 100 mg by mouth 2 (two) times daily.   hydrOXYzine (VISTARIL) 25 MG capsule Take 1 capsule (25 mg total) by mouth every 8 (eight) hours as needed.   rosuvastatin (CRESTOR) 5 MG  tablet Take 1 tablet (5 mg total) by mouth daily.   valsartan-hydrochlorothiazide (DIOVAN-HCT) 320-12.5 MG tablet Take 1 tablet by mouth daily.   [DISCONTINUED] omeprazole (PRILOSEC) 20 MG capsule Take 1 capsule (20 mg total) by mouth daily.   [DISCONTINUED] Fluocinolone Acetonide Body 0.01 % OIL Apply topically. (Patient not taking: Reported on 05/28/2022)   [DISCONTINUED] folic acid (FOLVITE) 1 MG tablet Take 1 mg by mouth daily. (Patient not taking: Reported on 05/28/2022)   [DISCONTINUED] Tapinarof (VTAMA) 1 % CREA Apply 1 application topically daily. (Patient not taking: Reported on 05/28/2022)   [DISCONTINUED] triamcinolone ointment (KENALOG) 0.5 % Apply 1 application topically 2 (two) times daily. (Patient not taking: Reported on 05/28/2022)   No facility-administered medications prior to visit.    Review of Systems  Constitutional:  Negative for fatigue and fever.  Respiratory:  Negative for cough and shortness of breath.   Cardiovascular:  Negative for chest pain and leg swelling.  Gastrointestinal:  Negative for abdominal pain.  Neurological:  Negative for dizziness and headaches.      Objective    BP 123/85 (BP Location: Left Arm, Patient Position: Sitting, Cuff Size: Large)   Pulse 92   Temp 98.1 F (36.7 C) (Oral)   Resp 13   Ht 5\' 4"  (1.626 m)   Wt 210 lb 9.6 oz (95.5  kg)   LMP  (LMP Unknown)   SpO2 99%   BMI 36.15 kg/m  Blood pressure 123/85, pulse 92, temperature 98.1 F (36.7 C), temperature source Oral, resp. rate 13, height 5\' 4"  (1.626 m), weight 210 lb 9.6 oz (95.5 kg), SpO2 99 %.   Physical Exam Vitals reviewed.  Constitutional:      Appearance: She is not ill-appearing.  HENT:     Head: Normocephalic.  Eyes:     Conjunctiva/sclera: Conjunctivae normal.  Cardiovascular:     Rate and Rhythm: Normal rate.  Pulmonary:     Effort: Pulmonary effort is normal. No respiratory distress.  Neurological:     General: No focal deficit present.     Mental Status:  She is alert and oriented to person, place, and time.  Psychiatric:        Mood and Affect: Mood normal.        Behavior: Behavior normal.      No results found for any visits on 05/28/22.  Assessment & Plan     Problem List Items Addressed This Visit       Cardiovascular and Mediastinum   Primary hypertension - Primary    In appropriate range! Continue medication at current dose F/u 4 mo         Digestive   Gastroesophageal reflux disease    Pt seen to have gastritis on endoscopy; d/c bc powders, increased omeprazole to 20 bid      Relevant Medications   omeprazole (PRILOSEC) 20 MG capsule     Other   Alcohol use disorder    Pt reports being sober 3 weeks.        Return in about 4 months (around 09/27/2022) for hypertension.      I, Mikey Kirschner, PA-C have reviewed all documentation for this visit. The documentation on  05/28/22  for the exam, diagnosis, procedures, and orders are all accurate and complete.  Mikey Kirschner, PA-C [facility] [address] [place], [place], [address] Office: [phone] Fax: [place]

## 2022-05-28 NOTE — Assessment & Plan Note (Signed)
In appropriate range! Continue medication at current dose F/u 4 mo

## 2022-06-03 NOTE — Progress Notes (Deleted)
      Established patient visit   Patient: Tamara Hoffman   DOB: [birth year]   57 y.o. Female  MRN: [id number] Visit Date: 06/04/2022  Today's healthcare provider: Mikey Kirschner, PA-C   No chief complaint on file.  Subjective    HPI  ***  Medications: Outpatient Medications Prior to Visit  Medication Sig   albuterol (VENTOLIN HFA) 108 (90 Base) MCG/ACT inhaler Inhale 2 puffs into the lungs every 6 (six) hours as needed for wheezing or shortness of breath.   busPIRone (BUSPAR) 5 MG tablet Take 1 tablet (5 mg total) by mouth 2 (two) times daily. Please make an appointment in office   celecoxib (CELEBREX) 100 MG capsule Take 100 mg by mouth 2 (two) times daily.   hydrOXYzine (VISTARIL) 25 MG capsule Take 1 capsule (25 mg total) by mouth every 8 (eight) hours as needed.   omeprazole (PRILOSEC) 20 MG capsule Take 1 capsule (20 mg total) by mouth 2 (two) times daily before a meal.   rosuvastatin (CRESTOR) 5 MG tablet Take 1 tablet (5 mg total) by mouth daily.   valsartan-hydrochlorothiazide (DIOVAN-HCT) 320-12.5 MG tablet Take 1 tablet by mouth daily.   No facility-administered medications prior to visit.    Review of Systems  {Labs  Heme  Chem  Endocrine  Serology  Results Review (optional):23779}   Objective    LMP  (LMP Unknown)  {Show previous vital signs (optional):23777}  Physical Exam  ***  No results found for any visits on 06/04/22.  Assessment & Plan     ***  No follow-ups on file.      {provider attestation***:1}   Mikey Kirschner, PA-C  [place] [phone] (phone) [phone] (fax)  [place]

## 2022-06-04 ENCOUNTER — Ambulatory Visit: Payer: 59 | Admitting: Physician Assistant

## 2022-06-04 ENCOUNTER — Telehealth: Payer: Self-pay

## 2022-06-04 NOTE — Telephone Encounter (Signed)
I thought she had an appointment for today but I don't see her on the schedule   Copied from [place] [id number]. Topic: General - Other >> Jun 04, 2022  9:00 AM Dominique A wrote: Reason for CRM: Pt states that her home has VOC's (Volatile Organic Compounds) and she is needing lab work done to see if she has any of it in her blood. Pt is wanting to know if PCP is able to perform this lab work. Please call pt back.

## 2022-06-05 NOTE — Telephone Encounter (Signed)
Patien advised to make an office visit once she has received more information to further discuss. Patient verbalized understanding

## 2022-06-09 ENCOUNTER — Telehealth: Payer: Self-pay

## 2022-06-09 DIAGNOSIS — R1013 Epigastric pain: Secondary | ICD-10-CM

## 2022-06-09 NOTE — Telephone Encounter (Signed)
Patient called stating that today she had an episode of unbearable epigastric pain, nausea, sweats and vomiting lasting 45 minutes. Patient stated that she had been dealing with this issue on/off since September 2023. Patient stated that she had been to [facility] in September 2023 but left because it had been hours of waiting and never was seen. She then contacted her PCP and her PCP didn't do anything about it (no labs/images). She then had another episode of the same symptoms 2 months later and same thing, it lasted about 30 minutes. She was then doing well after that and for the past 8 days, she has had 3 episodes of the same symptoms but now they last longer. Patient stated that she starts with the unbearable epigastric pain, sweats, nausea and vomiting. But once she is able to vomit, her symptoms stop. Patient stated that she contacted her PCP to let her know but they told her to call her GI doctor. Patient stated that she had been here to see the doctor but that she was not given the opportunity to tell the doctor about her abdominal symptoms instead a colonoscopy was done for the rectal bleeding she's been also having. Patient doesn't know what could be done for her as she had done everything that had been asked for her to stop doing: stopped drinking, smoking and taking BC powders. Patient stated that no labs nor images had been ordered for her but she knows something is wrong. Please advise.

## 2022-06-10 MED ORDER — SUCRALFATE 1 G PO TABS: 1.0000 g | ORAL_TABLET | Freq: Three times a day (TID) | ORAL | 0 refills | Status: DC

## 2022-06-10 NOTE — Telephone Encounter (Signed)
Spoke with patient -she agrees to CT abdomen and Hida scan. She has stopped the [facility] powders. Currently taking Prilosec daily.   Patient is aware of CT/HIDA scan appointments.

## 2022-06-15 ENCOUNTER — Ambulatory Visit
Admission: RE | Admit: 2022-06-15 | Discharge: 2022-06-15 | Disposition: A | Discharge status: Home / Self Care | Payer: 59 | Source: Ambulatory Visit | Attending: Physician Assistant | Admitting: Physician Assistant

## 2022-06-15 DIAGNOSIS — Z1231 Encounter for screening mammogram for malignant neoplasm of breast: Secondary | ICD-10-CM | POA: Diagnosis not present

## 2022-06-17 ENCOUNTER — Telehealth: Payer: Self-pay

## 2022-06-17 NOTE — Telephone Encounter (Signed)
Called scheduling to reschedule her CT Scan of her abdomen since her insurance had not approved it yet. CT is scheduled for 06/25/2022 at the [facility] and patient is to report at 1:15 PM.

## 2022-06-17 NOTE — Telephone Encounter (Signed)
Patient pending authorization  CT abdominal with contrast is pending medical director review. Her scan is schedule for 06/18/2022. I do not know if this request will be approve by tomorrow. We are going to need to get patient reschedule to a different day. Request was submitted on 06/10/2022.

## 2022-06-18 ENCOUNTER — Ambulatory Visit: Payer: 59

## 2022-06-23 ENCOUNTER — Encounter
Admission: RE | Admit: 2022-06-23 | Discharge: 2022-06-23 | Disposition: A | Discharge status: Home / Self Care | Payer: 59 | Source: Ambulatory Visit | Attending: Gastroenterology | Admitting: Gastroenterology

## 2022-06-23 DIAGNOSIS — R1013 Epigastric pain: Secondary | ICD-10-CM | POA: Diagnosis not present

## 2022-06-23 MED ORDER — TECHNETIUM TC 99M MEBROFENIN IV KIT
5.0000 | PACK | Freq: Once | INTRAVENOUS | Status: AC
  Administered 2022-06-23: 5.21 via INTRAVENOUS

## 2022-06-25 ENCOUNTER — Ambulatory Visit: Admission: RE | Admit: 2022-06-25 | Payer: 59 | Source: Ambulatory Visit

## 2022-07-02 ENCOUNTER — Ambulatory Visit
Admission: RE | Admit: 2022-07-02 | Discharge: 2022-07-02 | Disposition: A | Discharge status: Home / Self Care | Payer: 59 | Source: Ambulatory Visit | Attending: Gastroenterology | Admitting: Gastroenterology

## 2022-07-02 DIAGNOSIS — R1013 Epigastric pain: Secondary | ICD-10-CM

## 2022-07-06 ENCOUNTER — Ambulatory Visit: Payer: 59 | Attending: Cardiology | Admitting: Cardiology

## 2022-07-07 ENCOUNTER — Encounter: Payer: Self-pay | Admitting: Cardiology

## 2022-07-18 ENCOUNTER — Inpatient Hospital Stay
Admission: EM | Admit: 2022-07-18 | Discharge: 2022-07-20 | DRG: 378 | Disposition: A | Discharge status: Home / Self Care | Payer: 59 | Attending: Internal Medicine | Admitting: Internal Medicine

## 2022-07-18 ENCOUNTER — Other Ambulatory Visit: Payer: Self-pay

## 2022-07-18 ENCOUNTER — Emergency Department: Payer: 59

## 2022-07-18 DIAGNOSIS — K3189 Other diseases of stomach and duodenum: Secondary | ICD-10-CM | POA: Diagnosis present

## 2022-07-18 DIAGNOSIS — R58 Hemorrhage, not elsewhere classified: Secondary | ICD-10-CM | POA: Diagnosis not present

## 2022-07-18 DIAGNOSIS — F17290 Nicotine dependence, other tobacco product, uncomplicated: Secondary | ICD-10-CM | POA: Diagnosis present

## 2022-07-18 DIAGNOSIS — Z833 Family history of diabetes mellitus: Secondary | ICD-10-CM

## 2022-07-18 DIAGNOSIS — K625 Hemorrhage of anus and rectum: Secondary | ICD-10-CM

## 2022-07-18 DIAGNOSIS — K219 Gastro-esophageal reflux disease without esophagitis: Secondary | ICD-10-CM | POA: Diagnosis present

## 2022-07-18 DIAGNOSIS — R1013 Epigastric pain: Principal | ICD-10-CM

## 2022-07-18 DIAGNOSIS — N179 Acute kidney failure, unspecified: Secondary | ICD-10-CM | POA: Diagnosis present

## 2022-07-18 DIAGNOSIS — K921 Melena: Secondary | ICD-10-CM | POA: Diagnosis present

## 2022-07-18 DIAGNOSIS — J449 Chronic obstructive pulmonary disease, unspecified: Secondary | ICD-10-CM | POA: Diagnosis present

## 2022-07-18 DIAGNOSIS — K529 Noninfective gastroenteritis and colitis, unspecified: Secondary | ICD-10-CM | POA: Diagnosis present

## 2022-07-18 DIAGNOSIS — R000 Tachycardia, unspecified: Secondary | ICD-10-CM | POA: Diagnosis not present

## 2022-07-18 DIAGNOSIS — Z8249 Family history of ischemic heart disease and other diseases of the circulatory system: Secondary | ICD-10-CM

## 2022-07-18 DIAGNOSIS — Z8711 Personal history of peptic ulcer disease: Secondary | ICD-10-CM

## 2022-07-18 DIAGNOSIS — F1010 Alcohol abuse, uncomplicated: Secondary | ICD-10-CM | POA: Diagnosis present

## 2022-07-18 DIAGNOSIS — I10 Essential (primary) hypertension: Secondary | ICD-10-CM | POA: Diagnosis present

## 2022-07-18 DIAGNOSIS — K5730 Diverticulosis of large intestine without perforation or abscess without bleeding: Secondary | ICD-10-CM | POA: Diagnosis present

## 2022-07-18 DIAGNOSIS — Z6836 Body mass index (BMI) 36.0-36.9, adult: Secondary | ICD-10-CM

## 2022-07-18 DIAGNOSIS — Z791 Long term (current) use of non-steroidal anti-inflammatories (NSAID): Secondary | ICD-10-CM

## 2022-07-18 DIAGNOSIS — F17210 Nicotine dependence, cigarettes, uncomplicated: Secondary | ICD-10-CM | POA: Diagnosis present

## 2022-07-18 DIAGNOSIS — K264 Chronic or unspecified duodenal ulcer with hemorrhage: Principal | ICD-10-CM | POA: Diagnosis present

## 2022-07-18 DIAGNOSIS — R1084 Generalized abdominal pain: Secondary | ICD-10-CM | POA: Diagnosis not present

## 2022-07-18 DIAGNOSIS — E669 Obesity, unspecified: Secondary | ICD-10-CM | POA: Diagnosis present

## 2022-07-18 DIAGNOSIS — Z743 Need for continuous supervision: Secondary | ICD-10-CM | POA: Diagnosis not present

## 2022-07-18 DIAGNOSIS — Z79899 Other long term (current) drug therapy: Secondary | ICD-10-CM

## 2022-07-18 DIAGNOSIS — R0902 Hypoxemia: Secondary | ICD-10-CM | POA: Diagnosis not present

## 2022-07-18 DIAGNOSIS — Z888 Allergy status to other drugs, medicaments and biological substances status: Secondary | ICD-10-CM

## 2022-07-18 LAB — TYPE AND SCREEN
ABO/RH(D): A POS
Antibody Screen: NEGATIVE

## 2022-07-18 LAB — COMPREHENSIVE METABOLIC PANEL
ALT: 27 U/L (ref 0–44)
AST: 29 U/L (ref 15–41)
Albumin: 4.5 g/dL (ref 3.5–5.0)
Alkaline Phosphatase: 105 U/L (ref 38–126)
Anion gap: 17 — ABNORMAL HIGH (ref 5–15)
BUN: 36 mg/dL — ABNORMAL HIGH (ref 6–20)
CO2: 18 mmol/L — ABNORMAL LOW (ref 22–32)
Calcium: 10 mg/dL (ref 8.9–10.3)
Chloride: 102 mmol/L (ref 98–111)
Creatinine, Ser: 2.31 mg/dL — ABNORMAL HIGH (ref 0.44–1.00)
GFR, Estimated: 24 mL/min — ABNORMAL LOW (ref >60)
Glucose, Bld: 137 mg/dL — ABNORMAL HIGH (ref 70–99)
Potassium: 4.2 mmol/L (ref 3.5–5.1)
Sodium: 137 mmol/L (ref 135–145)
Total Bilirubin: 0.8 mg/dL (ref 0.3–1.2)
Total Protein: 8.2 g/dL — ABNORMAL HIGH (ref 6.5–8.1)

## 2022-07-18 LAB — CBC WITH DIFFERENTIAL/PLATELET
Abs Immature Granulocytes: 0.06 10*3/uL (ref 0.00–0.07)
Basophils Absolute: 0.1 10*3/uL (ref 0.0–0.1)
Basophils Relative: 1 %
Eosinophils Absolute: 0.1 10*3/uL (ref 0.0–0.5)
Eosinophils Relative: 1 %
HCT: 44.1 % (ref 36.0–46.0)
Hemoglobin: 14.6 g/dL (ref 12.0–15.0)
Immature Granulocytes: 1 %
Lymphocytes Relative: 10 %
Lymphs Abs: 1.3 10*3/uL (ref 0.7–4.0)
MCH: 31.3 pg (ref 26.0–34.0)
MCHC: 33.1 g/dL (ref 30.0–36.0)
MCV: 94.4 fL (ref 80.0–100.0)
Monocytes Absolute: 0.7 10*3/uL (ref 0.1–1.0)
Monocytes Relative: 5 %
Neutro Abs: 10.9 10*3/uL — ABNORMAL HIGH (ref 1.7–7.7)
Neutrophils Relative %: 82 %
Platelets: 298 10*3/uL (ref 150–400)
RBC: 4.67 MIL/uL (ref 3.87–5.11)
RDW: 13.8 % (ref 11.5–15.5)
WBC: 13.1 10*3/uL — ABNORMAL HIGH (ref 4.0–10.5)
nRBC: 0 % (ref 0.0–0.2)

## 2022-07-18 LAB — URINALYSIS, ROUTINE W REFLEX MICROSCOPIC
Bacteria, UA: NONE SEEN
Bilirubin Urine: NEGATIVE
Glucose, UA: NEGATIVE mg/dL
Hgb urine dipstick: NEGATIVE
Ketones, ur: 5 mg/dL — AB
Nitrite: NEGATIVE
Protein, ur: 30 mg/dL — AB
Specific Gravity, Urine: 1.018 (ref 1.005–1.030)
pH: 5 (ref 5.0–8.0)

## 2022-07-18 LAB — LIPASE, BLOOD: Lipase: 32 U/L (ref 11–51)

## 2022-07-18 MED ORDER — LACTATED RINGERS IV BOLUS
1000.0000 mL | Freq: Once | INTRAVENOUS | Status: AC
  Administered 2022-07-19: 1000 mL via INTRAVENOUS

## 2022-07-18 MED ORDER — ONDANSETRON HCL 4 MG/2ML IJ SOLN
INTRAMUSCULAR | Status: AC
  Filled 2022-07-18: qty 2

## 2022-07-18 MED ORDER — PANTOPRAZOLE SODIUM 40 MG IV SOLR
40.0000 mg | Freq: Once | INTRAVENOUS | Status: AC
  Administered 2022-07-19: 40 mg via INTRAVENOUS
  Filled 2022-07-18: qty 10

## 2022-07-18 MED ORDER — MORPHINE SULFATE (PF) 4 MG/ML IV SOLN
4.0000 mg | Freq: Once | INTRAVENOUS | Status: AC
  Administered 2022-07-18: 4 mg via INTRAVENOUS
  Filled 2022-07-18: qty 1

## 2022-07-18 MED ORDER — ONDANSETRON HCL 4 MG/2ML IJ SOLN
4.0000 mg | Freq: Once | INTRAMUSCULAR | Status: AC
  Administered 2022-07-18: 4 mg via INTRAVENOUS

## 2022-07-18 NOTE — ED Provider Notes (Signed)
[facility] Provider Note    Event Date/Time   First MD Initiated Contact with Patient 07/18/22 2303     (approximate)   History   Chief Complaint Rectal Bleeding and Abdominal Pain   HPI  Tamara Hoffman is a 57 y.o. female with past medical history of hypertension, COPD, polysubstance abuse, alcohol abuse, and peptic ulcer disease who presents to the ED complaining of abdominal pain.  Patient reports that she has been dealing with intermittent episodes of severe pain in her epigastric area since January.  She had another episode of pain this evening that did not seem to be brought on by anything.  She reports feeling nauseous with multiple episodes of vomiting and diarrhea, has noticed significant amount of bright red blood in her stool.  She reports being evaluated by GI in the past for similar symptoms, noted to have superficial ulcers on recent EGD with unremarkable colonoscopy.  Patient states that she quit drinking alcohol back in February but did have a drink earlier this week.  She also admits to using marijuana regularly and takes Celebrex on a daily basis.     Physical Exam   Triage Vital Signs: ED Triage Vitals  Enc Vitals Group     BP 07/18/22 2216 (!) 135/97     Pulse Rate 07/18/22 2216 (!) 108     Resp 07/18/22 2216 (!) 22     Temp 07/18/22 2216 97.8 F (36.6 C)     Temp src --      SpO2 07/18/22 2216 98 %     Weight 07/18/22 2220 210 lb (95.3 kg)     Height 07/18/22 2220 5\' 4"  (1.626 m)     Head Circumference --      Peak Flow --      Pain Score 07/18/22 2219 10     Pain Loc --      Pain Edu? --      Excl. in GC? --     Most recent vital signs: Vitals:   07/18/22 2216 07/18/22 2330  BP: (!) 135/97 (!) 154/79  Pulse: (!) 108   Resp: (!) 22   Temp: 97.8 F (36.6 C)   SpO2: 98%     Constitutional: Alert and oriented. Eyes: Conjunctivae are normal. Head: Atraumatic. Nose: No congestion/rhinnorhea. Mouth/Throat: Mucous  membranes are moist.  Cardiovascular: Normal rate, regular rhythm. Grossly normal heart sounds.  2+ radial pulses bilaterally. Respiratory: Normal respiratory effort.  No retractions. Lungs CTAB. Gastrointestinal: Soft and diffusely tender to palpation, greatest in the epigastrium. No distention.  Rectal exam with external hemorrhoids but no active bleeding noted, stool is light brown and guaiac negative. Musculoskeletal: No lower extremity tenderness nor edema.  Neurologic:  Normal speech and language. No gross focal neurologic deficits are appreciated.    ED Results / Procedures / Treatments   Labs (all labs ordered are listed, but only abnormal results are displayed) Labs Reviewed  COMPREHENSIVE METABOLIC PANEL - Abnormal; Notable for the following components:      Result Value   CO2 18 (*)    Glucose, Bld 137 (*)    BUN 36 (*)    Creatinine, Ser 2.31 (*)    Total Protein 8.2 (*)    GFR, Estimated 24 (*)    Anion gap 17 (*)    All other components within normal limits  URINALYSIS, ROUTINE W REFLEX MICROSCOPIC - Abnormal; Notable for the following components:   Color, Urine YELLOW (*)  APPearance HAZY (*)    Ketones, ur 5 (*)    Protein, ur 30 (*)    Leukocytes,Ua TRACE (*)    All other components within normal limits  CBC WITH DIFFERENTIAL/PLATELET - Abnormal; Notable for the following components:   WBC 13.1 (*)    Neutro Abs 10.9 (*)    All other components within normal limits  LIPASE, BLOOD  PREGNANCY, URINE  POC OCCULT BLOOD, ED  TYPE AND SCREEN  TROPONIN I (HIGH SENSITIVITY)     EKG  ED ECG REPORT I, Chesley Noon, the attending physician, personally viewed and interpreted this ECG.   Date: 07/18/2022  EKG Time: 22:20  Rate: 107  Rhythm: sinus tachycardia  Axis: Normal  Intervals:none  ST&T Change: None  RADIOLOGY CT abdomen/pelvis reviewed and interpreted by me with signs of colitis, no focal fluid collections or dilated bowel loops  noted.  PROCEDURES:  Critical Care performed: No  Procedures   MEDICATIONS ORDERED IN ED: Medications  lactated ringers bolus 1,000 mL (has no administration in time range)  pantoprazole (PROTONIX) injection 40 mg (has no administration in time range)  ondansetron (ZOFRAN) injection 4 mg (4 mg Intravenous Given 07/18/22 2317)  morphine (PF) 4 MG/ML injection 4 mg (4 mg Intravenous Given 07/18/22 2334)     IMPRESSION / MDM / ASSESSMENT AND PLAN / ED COURSE  I reviewed the triage vital signs and the nursing notes.                              57 y.o. female with past medical history of hypertension, COPD, peptic ulcer disease, polysubstance use, and alcohol abuse who presents to the ED complaining of pain in her epigastric area starting earlier this evening associated with nausea, vomiting, and bloody stool.  Patient's presentation is most consistent with acute presentation with potential threat to life or bodily function.  Differential diagnosis includes, but is not limited to, upper GI bleed, lower GI bleed, peptic ulcer disease, hemorrhoids, anemia, electrolyte abnormality, AKI, pancreatitis, hepatitis, cholecystitis, biliary colic.  Patient nontoxic-appearing and in no acute distress, vital signs remarkable for tachycardia but otherwise reassuring.  Patient's abdomen is soft but diffusely tender to palpation, greatest in the epigastrium.  Labs thus far show significant AKI with mild increase in anion gap and acidosis, no acute electrolyte abnormality.  She also has a mild leukocytosis but hemoglobin is stable compared to previous.  We will further assess with CT imaging of her abdomen, patient did have recent HIDA scan performed that was unremarkable and I have low suspicion for biliary pathology.  LFTs and lipase are unremarkable.  We will treat symptomatically with IV morphine and Zofran, hydrate with IV fluids.  CT imaging consistent with colitis, doubt ischemic cause at this time  but may be contributing to bleeding.  Would also consider hemorrhoids as the source of her bleeding although no active bleeding noted on rectal exam.  Patient did pass some bright red blood per rectum but remains hemodynamically stable, will continue to trend hemoglobin.  Case discussed with hospitalist for admission for further management of GI bleed and AKI.      FINAL CLINICAL IMPRESSION(S) / ED DIAGNOSES   Final diagnoses:  Epigastric pain  Rectal bleeding  AKI (acute kidney injury) (HCC)  Colitis     Rx / DC Orders   ED Discharge Orders     None        Note:  This document  was prepared using Conservation officer, historic buildings and may include unintentional dictation errors.   Chesley Noon, MD 07/19/22 [phone]

## 2022-07-18 NOTE — ED Triage Notes (Signed)
Arrives EMS from home with c/o sudden severe mid/upper abdominal pain described as " a metal ball". Pain manifested around 4PM today while at work.   Says this pain has been recurrent.   After pain onset says she became nauseated and began vomiting followed with bright red diarrhea "that could fill up a bucket".   Upon EMS arrival pt laying in floor diaphoretic and in fetal position.   Says she was recently admitted in March for rectal bleeding. Denies any anticoagulants.

## 2022-07-19 DIAGNOSIS — J449 Chronic obstructive pulmonary disease, unspecified: Secondary | ICD-10-CM | POA: Diagnosis not present

## 2022-07-19 DIAGNOSIS — Z6836 Body mass index (BMI) 36.0-36.9, adult: Secondary | ICD-10-CM | POA: Diagnosis not present

## 2022-07-19 DIAGNOSIS — Z79899 Other long term (current) drug therapy: Secondary | ICD-10-CM | POA: Diagnosis not present

## 2022-07-19 DIAGNOSIS — K625 Hemorrhage of anus and rectum: Secondary | ICD-10-CM | POA: Diagnosis not present

## 2022-07-19 DIAGNOSIS — K921 Melena: Secondary | ICD-10-CM | POA: Diagnosis present

## 2022-07-19 DIAGNOSIS — K264 Chronic or unspecified duodenal ulcer with hemorrhage: Secondary | ICD-10-CM | POA: Diagnosis not present

## 2022-07-19 DIAGNOSIS — Z888 Allergy status to other drugs, medicaments and biological substances status: Secondary | ICD-10-CM | POA: Diagnosis not present

## 2022-07-19 DIAGNOSIS — R109 Unspecified abdominal pain: Secondary | ICD-10-CM | POA: Diagnosis not present

## 2022-07-19 DIAGNOSIS — K5730 Diverticulosis of large intestine without perforation or abscess without bleeding: Secondary | ICD-10-CM | POA: Diagnosis not present

## 2022-07-19 DIAGNOSIS — K529 Noninfective gastroenteritis and colitis, unspecified: Secondary | ICD-10-CM | POA: Diagnosis present

## 2022-07-19 DIAGNOSIS — Z8711 Personal history of peptic ulcer disease: Secondary | ICD-10-CM

## 2022-07-19 DIAGNOSIS — K219 Gastro-esophageal reflux disease without esophagitis: Secondary | ICD-10-CM | POA: Diagnosis not present

## 2022-07-19 DIAGNOSIS — K3189 Other diseases of stomach and duodenum: Secondary | ICD-10-CM | POA: Diagnosis not present

## 2022-07-19 DIAGNOSIS — N179 Acute kidney failure, unspecified: Secondary | ICD-10-CM | POA: Diagnosis not present

## 2022-07-19 DIAGNOSIS — F17210 Nicotine dependence, cigarettes, uncomplicated: Secondary | ICD-10-CM | POA: Diagnosis not present

## 2022-07-19 DIAGNOSIS — I10 Essential (primary) hypertension: Secondary | ICD-10-CM | POA: Diagnosis not present

## 2022-07-19 DIAGNOSIS — Z833 Family history of diabetes mellitus: Secondary | ICD-10-CM | POA: Diagnosis not present

## 2022-07-19 DIAGNOSIS — Z791 Long term (current) use of non-steroidal anti-inflammatories (NSAID): Secondary | ICD-10-CM | POA: Diagnosis not present

## 2022-07-19 DIAGNOSIS — R1013 Epigastric pain: Secondary | ICD-10-CM | POA: Diagnosis not present

## 2022-07-19 DIAGNOSIS — I700 Atherosclerosis of aorta: Secondary | ICD-10-CM | POA: Diagnosis not present

## 2022-07-19 DIAGNOSIS — F1010 Alcohol abuse, uncomplicated: Secondary | ICD-10-CM | POA: Diagnosis not present

## 2022-07-19 DIAGNOSIS — E669 Obesity, unspecified: Secondary | ICD-10-CM | POA: Diagnosis not present

## 2022-07-19 DIAGNOSIS — F17290 Nicotine dependence, other tobacco product, uncomplicated: Secondary | ICD-10-CM | POA: Diagnosis not present

## 2022-07-19 DIAGNOSIS — Z8249 Family history of ischemic heart disease and other diseases of the circulatory system: Secondary | ICD-10-CM | POA: Diagnosis not present

## 2022-07-19 LAB — BASIC METABOLIC PANEL
Anion gap: 11 (ref 5–15)
BUN: 30 mg/dL — ABNORMAL HIGH (ref 6–20)
CO2: 21 mmol/L — ABNORMAL LOW (ref 22–32)
Calcium: 8.9 mg/dL (ref 8.9–10.3)
Chloride: 104 mmol/L (ref 98–111)
Creatinine, Ser: 1.79 mg/dL — ABNORMAL HIGH (ref 0.44–1.00)
GFR, Estimated: 33 mL/min — ABNORMAL LOW (ref >60)
Glucose, Bld: 126 mg/dL — ABNORMAL HIGH (ref 70–99)
Potassium: 4.2 mmol/L (ref 3.5–5.1)
Sodium: 136 mmol/L (ref 135–145)

## 2022-07-19 LAB — HEMOGLOBIN
Hemoglobin: 13.2 g/dL (ref 12.0–15.0)
Hemoglobin: 12.9 g/dL (ref 12.0–15.0)
Hemoglobin: 13.1 g/dL (ref 12.0–15.0)
Hemoglobin: 13.3 g/dL (ref 12.0–15.0)

## 2022-07-19 LAB — PREGNANCY, URINE: Preg Test, Ur: NEGATIVE

## 2022-07-19 LAB — TROPONIN I (HIGH SENSITIVITY): Troponin I (High Sensitivity): 8 ng/L (ref <18)

## 2022-07-19 MED ORDER — ALBUTEROL SULFATE (2.5 MG/3ML) 0.083% IN NEBU: 2.5000 mg | INHALATION_SOLUTION | Freq: Four times a day (QID) | RESPIRATORY_TRACT | Status: DC | PRN

## 2022-07-19 MED ORDER — HYDROXYZINE HCL 25 MG PO TABS: 25.0000 mg | ORAL_TABLET | Freq: Four times a day (QID) | ORAL | Status: DC | PRN

## 2022-07-19 MED ORDER — ONDANSETRON HCL 4 MG PO TABS: 4.0000 mg | ORAL_TABLET | Freq: Four times a day (QID) | ORAL | Status: DC | PRN

## 2022-07-19 MED ORDER — BUSPIRONE HCL 10 MG PO TABS
5.0000 mg | ORAL_TABLET | Freq: Two times a day (BID) | ORAL | Status: DC
  Administered 2022-07-19 (×2): 5 mg via ORAL
  Filled 2022-07-19 (×2): qty 1

## 2022-07-19 MED ORDER — ACETAMINOPHEN 650 MG RE SUPP: 650.0000 mg | Freq: Four times a day (QID) | RECTAL | Status: DC | PRN

## 2022-07-19 MED ORDER — ROSUVASTATIN CALCIUM 5 MG PO TABS
5.0000 mg | ORAL_TABLET | Freq: Every day | ORAL | Status: DC
  Filled 2022-07-19 (×2): qty 1

## 2022-07-19 MED ORDER — MORPHINE SULFATE (PF) 2 MG/ML IV SOLN
2.0000 mg | INTRAVENOUS | Status: DC | PRN
  Administered 2022-07-19: 2 mg via INTRAVENOUS
  Filled 2022-07-19: qty 1

## 2022-07-19 MED ORDER — HYDRALAZINE HCL 20 MG/ML IJ SOLN: 5.0000 mg | INTRAMUSCULAR | Status: DC | PRN

## 2022-07-19 MED ORDER — PANTOPRAZOLE SODIUM 40 MG IV SOLR
40.0000 mg | INTRAVENOUS | Status: DC
  Administered 2022-07-19: 40 mg via INTRAVENOUS
  Filled 2022-07-19: qty 10

## 2022-07-19 MED ORDER — ALBUTEROL SULFATE HFA 108 (90 BASE) MCG/ACT IN AERS: 2.0000 | INHALATION_SPRAY | Freq: Four times a day (QID) | RESPIRATORY_TRACT | Status: DC | PRN

## 2022-07-19 MED ORDER — LACTATED RINGERS IV SOLN: INTRAVENOUS | Status: DC

## 2022-07-19 MED ORDER — ACETAMINOPHEN 325 MG PO TABS: 650.0000 mg | ORAL_TABLET | Freq: Four times a day (QID) | ORAL | Status: DC | PRN

## 2022-07-19 MED ORDER — ONDANSETRON HCL 4 MG/2ML IJ SOLN
4.0000 mg | Freq: Four times a day (QID) | INTRAMUSCULAR | Status: DC | PRN
  Administered 2022-07-19: 4 mg via INTRAVENOUS
  Filled 2022-07-19: qty 2

## 2022-07-19 NOTE — Assessment & Plan Note (Addendum)
Acute colitis History of peptic ulcer disease, superficial gastric ulcers 05/2022 S/p  normal colonoscopy and with nonbleeding superficial gastric ulcers on endoscopy March 2024 Started on clear liquid IV Protonix, morphine for pain, IV antiemetics, IV fluids GI consult Serial H&H and transfuse if necessary

## 2022-07-19 NOTE — H&P (Addendum)
History and Physical    Patient: Tamara Hoffman DOB: January 26, 1966 DOA: 07/18/2022 DOS: the patient was seen and examined on 07/19/2022 PCP: Alfredia Ferguson, PA-C  Patient coming from: Home  Chief Complaint:  Chief Complaint  Patient presents with   Rectal Bleeding   Abdominal Pain    HPI: Tamara Hoffman is a 57 y.o. female with medical history significant for Hypertension, prior history of GI bleed chronic NSAID use with Celebrex with melena earlier in the year for which she underwent upper and lower endoscopy in March 2024 With normal colonoscopy and with nonbleeding superficial gastric ulcers on endoscopy, who presents to theThe ED with sudden onset epigastric pain starting a few hours prior to admission, followed by nausea, nonbloody nonbilious and non-coffee-ground vomiting and then followed by a large amount of rectal bleeding.  Patient has a prior history of alcohol use but stopped going months ago ED course and data review: Mildly tachycardic to 108 in the ED with otherwise normal vitals.  Labs significant for leukocytosis of 13,000 with normal hemoglobin of 14.6.  BUN/creatinine 36/2.31.  Baseline creatinine 0.93 just 5 months prior.  Anion gap 17 and bicarb 18.  Lipase and LFTs within normal limits. EKG, personally viewed and interpreted showing sinus tachycardia at 107 with no concerning ischemic ST-T wave changes.  CT abdomen and pelvis without contrast showing the following IMPRESSION: 1. Evidence of at least a mild ascending colitis, without evidence of bowel obstruction or perforation. 2. Etiology is most likely infectious or inflammatory. Ischemic etiology is not strictly excluded but there are no calcific plaques in the SMA. There is also no pneumatosis. 3. Uncomplicated sigmoid diverticula. 4. Mildly steatotic liver. 5. Aortic atherosclerosis. 6. Small umbilical fat hernia.  Patient treated with an LR bolus, morphine, Zofran and IV Protonix. Hospitalist  consulted for admission.   Review of Systems: As mentioned in the history of present illness. All other systems reviewed and are negative.  Past Medical History:  Diagnosis Date   Arthritis    GERD (gastroesophageal reflux disease)    Heart murmur    Hypertension    Past Surgical History:  Procedure Laterality Date   COLONOSCOPY WITH PROPOFOL N/A 05/26/2022   Procedure: COLONOSCOPY WITH PROPOFOL;  Surgeon: Wyline Mood, MD;  Location: [facility] ENDOSCOPY;  Service: Gastroenterology;  Laterality: N/A;   ESOPHAGOGASTRODUODENOSCOPY (EGD) WITH PROPOFOL N/A 05/26/2022   Procedure: ESOPHAGOGASTRODUODENOSCOPY (EGD) WITH PROPOFOL;  Surgeon: Wyline Mood, MD;  Location: [facility] ENDOSCOPY;  Service: Gastroenterology;  Laterality: N/A;   LEEP     Social History:  reports that she has been smoking cigarettes. She has a 22.00 pack-year smoking history. She has never used smokeless tobacco. She reports current alcohol use of about 5.0 - 7.0 standard drinks of alcohol per week. She reports that she does not currently use drugs after having used the following drugs: Marijuana.  Allergies  Allergen Reactions   Promethazine Swelling   Promethazine Hcl Swelling    Family History  Problem Relation Age of Onset   Diabetes Mother    Heart Problems Mother    Heart failure Mother    Hypertension Father    Heart Problems Father    Coronary artery disease Father    Diabetes Sister    Heart murmur Sister    Cancer Maternal Grandmother    Heart murmur Sister     Prior to Admission medications   Medication Sig Start Date End Date Taking? Authorizing Provider  albuterol (VENTOLIN HFA) 108 (90 Base) MCG/ACT inhaler  Inhale 2 puffs into the lungs every 6 (six) hours as needed for wheezing or shortness of breath. 03/06/21   Alfredia Ferguson, PA-C  busPIRone (BUSPAR) 5 MG tablet Take 1 tablet (5 mg total) by mouth 2 (two) times daily. Please make an appointment in office 05/12/22   Drubel, Lillia Abed, PA-C  celecoxib  (CELEBREX) 100 MG capsule Take 100 mg by mouth 2 (two) times daily. 07/03/21   [provider]  hydrOXYzine (VISTARIL) 25 MG capsule Take 1 capsule (25 mg total) by mouth every 8 (eight) hours as needed. 05/13/22   Alfredia Ferguson, PA-C  omeprazole (PRILOSEC) 20 MG capsule Take 1 capsule (20 mg total) by mouth 2 (two) times daily before a meal. 05/28/22   Drubel, Lillia Abed, PA-C  rosuvastatin (CRESTOR) 5 MG tablet Take 1 tablet (5 mg total) by mouth daily. 05/20/22   Alfredia Ferguson, PA-C  sucralfate (CARAFATE) 1 g tablet Take 1 tablet (1 g total) by mouth 4 (four) times daily -  with meals and at bedtime. 06/10/22   Wyline Mood, MD  valsartan-hydrochlorothiazide (DIOVAN-HCT) 320-12.5 MG tablet Take 1 tablet by mouth daily. 05/13/22   Alfredia Ferguson, PA-C    Physical Exam: Vitals:   07/18/22 2216 07/18/22 2220 07/18/22 2330  BP: (!) 135/97  (!) 154/79  Pulse: (!) 108    Resp: (!) 22    Temp: 97.8 F (36.6 C)    SpO2: 98%    Weight:  95.3 kg   Height:  5\' 4"  (1.626 m)    Physical Exam Vitals and nursing note reviewed.  Constitutional:      General: She is not in acute distress.    Appearance: She is ill-appearing.  HENT:     Head: Normocephalic and atraumatic.  Cardiovascular:     Rate and Rhythm: Regular rhythm. Tachycardia present.     Heart sounds: Normal heart sounds.  Pulmonary:     Effort: Pulmonary effort is normal.     Breath sounds: Normal breath sounds.  Abdominal:     Palpations: Abdomen is soft.     Tenderness: There is abdominal tenderness in the epigastric area.  Neurological:     Mental Status: Mental status is at baseline.     Labs on Admission: I have personally reviewed following labs and imaging studies  CBC: Recent Labs  Lab 07/18/22 2228  WBC 13.1*  NEUTROABS 10.9*  HGB 14.6  HCT 44.1  MCV 94.4  PLT 298   Basic Metabolic Panel: Recent Labs  Lab 07/18/22 2228  NA 137  K 4.2  CL 102  CO2 18*  GLUCOSE 137*  BUN 36*  CREATININE 2.31*   CALCIUM 10.0   GFR: Estimated Creatinine Clearance: 30.8 mL/min (A) (by C-G formula based on SCr of 2.31 mg/dL (H)). Liver Function Tests: Recent Labs  Lab 07/18/22 2228  AST 29  ALT 27  ALKPHOS 105  BILITOT 0.8  PROT 8.2*  ALBUMIN 4.5   Recent Labs  Lab 07/18/22 2228  LIPASE 32   No results for input(s): "AMMONIA" in the last 168 hours. Coagulation Profile: No results for input(s): "INR", "PROTIME" in the last 168 hours. Cardiac Enzymes: No results for input(s): "CKTOTAL", "CKMB", "CKMBINDEX", "TROPONINI" in the last 168 hours. BNP (last 3 results) No results for input(s): "PROBNP" in the last 8760 hours. HbA1C: No results for input(s): "HGBA1C" in the last 72 hours. CBG: No results for input(s): "GLUCAP" in the last 168 hours. Lipid Profile: No results for input(s): "CHOL", "HDL", "LDLCALC", "TRIG", "CHOLHDL", "LDLDIRECT"  in the last 72 hours. Thyroid Function Tests: No results for input(s): "TSH", "T4TOTAL", "FREET4", "T3FREE", "THYROIDAB" in the last 72 hours. Anemia Panel: No results for input(s): "VITAMINB12", "FOLATE", "FERRITIN", "TIBC", "IRON", "RETICCTPCT" in the last 72 hours. Urine analysis:    Component Value Date/Time   COLORURINE YELLOW (A) 07/18/2022 2228   APPEARANCEUR HAZY (A) 07/18/2022 2228   LABSPEC 1.018 07/18/2022 2228   PHURINE 5.0 07/18/2022 2228   GLUCOSEU NEGATIVE 07/18/2022 2228   HGBUR NEGATIVE 07/18/2022 2228   BILIRUBINUR NEGATIVE 07/18/2022 2228   BILIRUBINUR negative 05/16/2019 1552   KETONESUR 5 (A) 07/18/2022 2228   PROTEINUR 30 (A) 07/18/2022 2228   UROBILINOGEN 1.0 05/16/2019 1552   NITRITE NEGATIVE 07/18/2022 2228   LEUKOCYTESUR TRACE (A) 07/18/2022 2228    Radiological Exams on Admission: CT ABDOMEN PELVIS WO CONTRAST  Result Date: 07/19/2022 CLINICAL DATA:  Nausea, vomiting, abdominal pain and bloody diarrhea. EXAM: CT ABDOMEN AND PELVIS WITHOUT CONTRAST TECHNIQUE: Multidetector CT imaging of the abdomen and pelvis  was performed following the standard protocol without IV contrast. RADIATION DOSE REDUCTION: This exam was performed according to the departmental dose-optimization program which includes automated exposure control, adjustment of the mA and/or kV according to patient size and/or use of iterative reconstruction technique. COMPARISON:  CT with IV contrast 02/22/2014 FINDINGS: Lower chest: No acute abnormality. Hepatobiliary: Liver is 18 cm in length and mildly steatotic no masses seen without contrast. The gallbladder and bile ducts are unremarkable. Pancreas: Unremarkable without contrast. Spleen: Unremarkable without contrast. Adrenals/Urinary Tract: There is no adrenal mass or contour deforming abnormality of either kidney. There is no urinary stone or obstruction. The bladder is unremarkable for the degree of distention. Stomach/Bowel: The gastric wall is unremarkable. The small bowel is normal caliber without inflammatory changes. Incidentally noted is a 1.5 cm submucosal lipoma at the junction of the descending and horizontal segments the duodenum. The appendix is normal caliber. There are mild thickened folds and stranding along the ascending colon. Remainder of the colon wall is contracted but grossly normal. There are uncomplicated sigmoid diverticula. There is no bowel pneumatosis or evidence of perforation. Vascular/Lymphatic: Numerous pelvic phleboliths. There is moderate aortoiliac calcific plaque. Moderate calcification in the proximal right renal artery. No significant calcification in the SMA and celiac artery. No adenopathy is seen. Reproductive: Uterus and bilateral adnexa are unremarkable. Other: There small umbilical fat hernia. There is no incarcerated hernia. There is no free air, free hemorrhage or free fluid. Musculoskeletal: Mild degenerative disc change and vacuum phenomena at L5-S1. No acute or other significant osseous findings. IMPRESSION: 1. Evidence of at least a mild ascending colitis,  without evidence of bowel obstruction or perforation. 2. Etiology is most likely infectious or inflammatory. Ischemic etiology is not strictly excluded but there are no calcific plaques in the SMA. There is also no pneumatosis. 3. Uncomplicated sigmoid diverticula. 4. Mildly steatotic liver. 5. Aortic atherosclerosis. 6. Small umbilical fat hernia. Aortic Atherosclerosis (ICD10-I70.0). Electronically Signed   By: Almira Bar M.D.   On: 07/19/2022 00:34     Data Reviewed: Relevant notes from primary care and specialist visits, past discharge summaries as available in EHR, including Care Everywhere. Prior diagnostic testing as pertinent to current admission diagnoses Updated medications and problem lists for reconciliation ED course, including vitals, labs, imaging, treatment and response to treatment Triage notes, nursing and pharmacy notes and ED provider's notes Notable results as noted in HPI   Assessment and Plan: * Hematochezia Acute colitis History of peptic ulcer disease, superficial  gastric ulcers 05/2022 S/p  normal colonoscopy and with nonbleeding superficial gastric ulcers on endoscopy March 2024 Keep n.p.o. IV Protonix, morphine for pain, IV antiemetics, IV fluids GI consult Serial H&H and transfuse if necessary  AKI (acute kidney injury) (HCC) BUN/creatinine 36/2.31.  Baseline creatinine 0.93 just 5 months prior. IV hydration with LR and monitor, avoiding nephrotoxins  Primary hypertension Hydralazine IV as needed while n.p.o.        DVT prophylaxis: SCD   Consults: GI, Dr. Tobi Bastos  Advance Care Planning:   Code Status: Prior   Family Communication: none  Disposition Plan: Back to previous home environment  Severity of Illness: The appropriate patient status for this patient is OBSERVATION. Observation status is judged to be reasonable and necessary in order to provide the required intensity of service to ensure the patient's safety. The patient's presenting  symptoms, physical exam findings, and initial radiographic and laboratory data in the context of their medical condition is felt to place them at decreased risk for further clinical deterioration. Furthermore, it is anticipated that the patient will be medically stable for discharge from the hospital within 2 midnights of admission.   Author: Andris Baumann, MD 07/19/2022 12:58 AM  For on call review [email or web address].

## 2022-07-19 NOTE — Assessment & Plan Note (Signed)
Hydralazine IV as needed while n.p.o. 

## 2022-07-19 NOTE — Progress Notes (Signed)
Progress Note   Patient: Tamara Hoffman:[id number] DOB: 11-02-1965 DOA: 07/18/2022     0 DOS: the patient was seen and examined on 07/19/2022   Brief hospital course: Taken from H&P.  : Tamara Hoffman is a 57 y.o. female with medical history significant for Hypertension, prior history of GI bleed chronic NSAID use with Celebrex with melena earlier in the year for which she underwent upper and lower endoscopy in March 2024 With normal colonoscopy and with nonbleeding superficial gastric ulcers on endoscopy, who presents to theThe ED with sudden onset epigastric pain starting a few hours prior to admission, followed by nausea, nonbloody nonbilious and non-coffee-ground vomiting and then followed by a large amount of rectal bleeding.  Patient has a prior history of alcohol use but stopped going months ago.  ED course and data review.  Vitals with mild tachycardia at 108, labs pertinent for leukocytosis at 13,000, hemoglobin normal at 14.6, bicarb 18.  Lipase and LFT normal.  EKG with sinus tachycardia and no significant ST changes. CT abdomen and pelvis shows the following 1. Evidence of at least a mild ascending colitis, without evidence of bowel obstruction or perforation. 2. Etiology is most likely infectious or inflammatory. Ischemic etiology is not strictly excluded but there are no calcific plaques in the SMA. There is also no pneumatosis. 3. Uncomplicated sigmoid diverticula. 4. Mildly steatotic liver. 5. Aortic atherosclerosis. 6. Small umbilical fat hernia.  Patient was started on Zosyn and GI was consulted.  5/19: Vital stable.  Repeat hemoglobin at 13.2.  No more active bleeding.  Improving abdominal pain.  Pending GI evaluation.    Assessment and Plan: * Hematochezia Acute colitis History of peptic ulcer disease, superficial gastric ulcers 05/2022 S/p  normal colonoscopy and with nonbleeding superficial gastric ulcers on endoscopy March 2024 Started on clear liquid IV  Protonix, morphine for pain, IV antiemetics, IV fluids GI consult Serial H&H and transfuse if necessary  AKI (acute kidney injury) (HCC) BUN/creatinine 36/2.31.  Baseline creatinine 0.93 just 5 months prior Started improving. IV hydration with LR and monitor, avoiding nephrotoxins  Primary hypertension Hydralazine IV as needed while n.p.o.   Subjective: Patient was seen and examined today.  No more bleeding per rectum, had a normal bowel movement this morning.  Still having some abdominal pain but stating it is much better than before.  Physical Exam: Vitals:   07/19/22 0515 07/19/22 0640 07/19/22 0808 07/19/22 0809  BP: 112/83  (!) 140/61   Pulse: 91  89 74  Resp: 20     Temp:  97.9 F (36.6 C)    TempSrc:  Oral    SpO2: 96%  100% 100%  Weight:      Height:       General.  Obese lady, in no acute distress. Pulmonary.  Lungs clear bilaterally, normal respiratory effort. CV.  Regular rate and rhythm, no JVD, rub or murmur. Abdomen.  Soft, nontender, nondistended, BS positive. CNS.  Alert and oriented .  No focal neurologic deficit. Extremities.  No edema, no cyanosis, pulses intact and symmetrical. Psychiatry.  Judgment and insight appears normal.   Data Reviewed: Prior data reviewed  Family Communication: Discussed with patient  Disposition: Status is: Observation The patient remains OBS appropriate and will d/c before 2 midnights.  Planned Discharge Destination: Home  Time spent:  minutes  This record has been created using Conservation officer, historic buildings. Errors have been sought and corrected,but may not always be located. Such creation errors do not reflect  on the standard of care.   Author: Arnetha Courser, MD 07/19/2022 11:43 AM  For on call review [email or web address].

## 2022-07-19 NOTE — ED Notes (Signed)
Patient given wash cloths, soap, toothbrush and paste

## 2022-07-19 NOTE — Hospital Course (Addendum)
Taken from H&P.  : Tamara Hoffman is a 57 y.o. female with medical history significant for Hypertension, prior history of GI bleed chronic NSAID use with Celebrex with melena earlier in the year for which she underwent upper and lower endoscopy in March 2024 With normal colonoscopy and with nonbleeding superficial gastric ulcers on endoscopy, who presents to theThe ED with sudden onset epigastric pain starting a few hours prior to admission, followed by nausea, nonbloody nonbilious and non-coffee-ground vomiting and then followed by a large amount of rectal bleeding.  Patient has a prior history of alcohol use but stopped going months ago.  ED course and data review.  Vitals with mild tachycardia at 108, labs pertinent for leukocytosis at 13,000, hemoglobin normal at 14.6, bicarb 18.  Lipase and LFT normal.  EKG with sinus tachycardia and no significant ST changes. CT abdomen and pelvis shows the following 1. Evidence of at least a mild ascending colitis, without evidence of bowel obstruction or perforation. 2. Etiology is most likely infectious or inflammatory. Ischemic etiology is not strictly excluded but there are no calcific plaques in the SMA. There is also no pneumatosis. 3. Uncomplicated sigmoid diverticula. 4. Mildly steatotic liver. 5. Aortic atherosclerosis. 6. Small umbilical fat hernia.  Patient was started on Zosyn and GI was consulted.  5/19: Vital stable.  Repeat hemoglobin at 13.2.  No more active bleeding.  Improving abdominal pain.  Pending GI evaluation.  5/20: Remained hemodynamically stable.  No more bleeding and hemoglobin improving.  EGD today with a small nonbleeding duodenal ulcer and some scarring in stomach.  Biopsies were taken.  GI is planning to do capsule endoscopy as outpatient.  Patient should continue with Protonix and need to have a close follow-up with gastroenterology for further evaluation and management.  As patient continued to have some crampy abdominal  pain, no diarrhea, nausea or vomiting. CT was concerning for colitis.  She was given a 5-day course of ciprofloxacin and Flagyl.  Patient will continue with rest of her home medications and need to have a close follow-up with her providers for further recommendations.

## 2022-07-19 NOTE — Consult Note (Addendum)
Wyline Mood , MD [address], [place], [place], [address] [address], [address], [place], [place], [address] Phone: [phone]  Fax: [phone]  Consultation  Referring Provider:     Dr Para March Primary Care Physician:  Alfredia Ferguson, PA-C Primary Gastroenterologist:  Dr. Tobi Bastos         Reason for Consultation:     Hematochezia  Date of Admission:  07/18/2022 Date of Consultation:  07/19/2022         HPI:   Tamara Hoffman is a 57 y.o. female follows with myself as an outpatient was seen at my office on 05/25/2022 for rectal bleeding.  She had been having dark blood in her stools on the tissue paper and toilet bowl when she saw me she has been taking a lot of BC powders.  She presents emergency room with sudden onset epigastric pain with nausea vomiting coffee-ground emesis and then a large amount of rectal bleeding.  Occurred on a few occasions yesterday then stopped.  She states she stopped taking BC powders after her last endoscopy but took a few on Sunday last week.  Hemoglobin was 14.6 g on admission lipase normal.  Creatinine was 2.31 baseline 0.93 BUN of 36 Had loose stools when she had the bleeding but before that did not have any loose stools.   CT scan of the abdomen showed mild ascending colitis without evidence of bowel obstruction or perforation likely inflammatory.  Sigmoid diverticulosis.  05/26/2022 she underwent an upper endoscopy that I performed myself and she was found to have 3 nonbleeding superficial gastric ulcers with a clean base in the greater curvature the stomach biopsies were taken she was commenced on oral PPI for 3 months on the same day she underwent a colonoscopy that showed no abnormalities  Biopsies of the ulcer showed reactive gastropathy with changes suggestive of healing mucosal injury.    Past Medical History:  Diagnosis Date   Arthritis    GERD (gastroesophageal reflux disease)    Heart murmur    Hypertension     Past  Surgical History:  Procedure Laterality Date   COLONOSCOPY WITH PROPOFOL N/A 05/26/2022   Procedure: COLONOSCOPY WITH PROPOFOL;  Surgeon: Wyline Mood, MD;  Location: [facility] ENDOSCOPY;  Service: Gastroenterology;  Laterality: N/A;   ESOPHAGOGASTRODUODENOSCOPY (EGD) WITH PROPOFOL N/A 05/26/2022   Procedure: ESOPHAGOGASTRODUODENOSCOPY (EGD) WITH PROPOFOL;  Surgeon: Wyline Mood, MD;  Location: [facility] ENDOSCOPY;  Service: Gastroenterology;  Laterality: N/A;   LEEP      Prior to Admission medications   Medication Sig Start Date End Date Taking? Authorizing Provider  albuterol (VENTOLIN HFA) 108 (90 Base) MCG/ACT inhaler Inhale 2 puffs into the lungs every 6 (six) hours as needed for wheezing or shortness of breath. 03/06/21   Alfredia Ferguson, PA-C  busPIRone (BUSPAR) 5 MG tablet Take 1 tablet (5 mg total) by mouth 2 (two) times daily. Please make an appointment in office 05/12/22   Drubel, Lillia Abed, PA-C  celecoxib (CELEBREX) 100 MG capsule Take 100 mg by mouth 2 (two) times daily. 07/03/21   [provider]  hydrOXYzine (VISTARIL) 25 MG capsule Take 1 capsule (25 mg total) by mouth every 8 (eight) hours as needed. 05/13/22   Alfredia Ferguson, PA-C  omeprazole (PRILOSEC) 20 MG capsule Take 1 capsule (20 mg total) by mouth 2 (two) times daily before a meal. 05/28/22   Drubel, Lillia Abed, PA-C  rosuvastatin (CRESTOR) 5 MG tablet Take 1 tablet (5 mg total) by mouth daily. 05/20/22  Alfredia Ferguson, PA-C  sucralfate (CARAFATE) 1 g tablet Take 1 tablet (1 g total) by mouth 4 (four) times daily -  with meals and at bedtime. 06/10/22   Wyline Mood, MD  valsartan-hydrochlorothiazide (DIOVAN-HCT) 320-12.5 MG tablet Take 1 tablet by mouth daily. 05/13/22   Alfredia Ferguson, PA-C    Family History  Problem Relation Age of Onset   Diabetes Mother    Heart Problems Mother    Heart failure Mother    Hypertension Father    Heart Problems Father    Coronary artery disease Father    Diabetes Sister    Heart murmur Sister     Cancer Maternal Grandmother    Heart murmur Sister      Social History   Tobacco Use   Smoking status: Every Day    Packs/day: 1.00    Years: 22.00    Additional pack years: 0.00    Total pack years: 22.00    Types: Cigarettes   Smokeless tobacco: Never  Vaping Use   Vaping Use: Every day  Substance Use Topics   Alcohol use: Yes    Alcohol/week: 5.0 - 7.0 standard drinks of alcohol    Types: 5 - 7 Cans of beer per week    Comment: now is almost every day/occasional   Drug use: Not Currently    Types: Marijuana    Comment: Last used 1 week ago.    Allergies as of 07/18/2022 - Review Complete 07/18/2022  Allergen Reaction Noted   Promethazine Swelling 04/08/2016   Promethazine hcl Swelling 04/08/2016    Review of Systems:    All systems reviewed and negative except where noted in HPI.   Physical Exam:  Vital signs in last 24 hours: Temp:  [97.8 F (36.6 C)-97.9 F (36.6 C)] 97.9 F (36.6 C) (05/19 0640) Pulse Rate:  [74-108] 74 (05/19 0809) Resp:  [20-22] 20 (05/19 0515) BP: (112-154)/(61-97) 140/61 (05/19 0808) SpO2:  [92 %-100 %] 100 % (05/19 0809) Weight:  [95.3 kg] 95.3 kg (05/18 2220)   General:   Pleasant, cooperative in NAD Head:  Normocephalic and atraumatic. Eyes:   No icterus.   Conjunctiva pink. PERRLA. Ears:  Normal auditory acuity. Neck:  Supple; no masses or thyroidomegaly Lungs: Respirations even and unlabored. Lungs clear to auscultation bilaterally.   No wheezes, crackles, or rhonchi.  Heart:  Regular rate and rhythm;  Without murmur, clicks, rubs or gallops Abdomen:  Soft, nondistended, nontender. Normal bowel sounds. No appreciable masses or hepatomegaly.  No rebound or guarding.  Neurologic:  Alert and oriented x3;  grossly normal neurologically. Skin:  Intact without significant lesions or rashes. Cervical Nodes:  No significant cervical adenopathy. Psych:  Alert and cooperative. Normal affect.  LAB RESULTS: Recent Labs     07/18/22 2228 07/19/22 0645  WBC 13.1*  --   HGB 14.6 13.2  HCT 44.1  --   PLT 298  --    BMET Recent Labs    07/18/22 2228 07/19/22 0645  NA 137 136  K 4.2 4.2  CL 102 104  CO2 18* 21*  GLUCOSE 137* 126*  BUN 36* 30*  CREATININE 2.31* 1.79*  CALCIUM 10.0 8.9   LFT Recent Labs    07/18/22 2228  PROT 8.2*  ALBUMIN 4.5  AST 29  ALT 27  ALKPHOS 105  BILITOT 0.8   PT/INR No results for input(s): "LABPROT", "INR" in the last 72 hours.  STUDIES: CT ABDOMEN PELVIS WO CONTRAST  Result Date: 07/19/2022 CLINICAL DATA:  Nausea, vomiting, abdominal pain and bloody diarrhea. EXAM: CT ABDOMEN AND PELVIS WITHOUT CONTRAST TECHNIQUE: Multidetector CT imaging of the abdomen and pelvis was performed following the standard protocol without IV contrast. RADIATION DOSE REDUCTION: This exam was performed according to the departmental dose-optimization program which includes automated exposure control, adjustment of the mA and/or kV according to patient size and/or use of iterative reconstruction technique. COMPARISON:  CT with IV contrast 02/22/2014 FINDINGS: Lower chest: No acute abnormality. Hepatobiliary: Liver is 18 cm in length and mildly steatotic no masses seen without contrast. The gallbladder and bile ducts are unremarkable. Pancreas: Unremarkable without contrast. Spleen: Unremarkable without contrast. Adrenals/Urinary Tract: There is no adrenal mass or contour deforming abnormality of either kidney. There is no urinary stone or obstruction. The bladder is unremarkable for the degree of distention. Stomach/Bowel: The gastric wall is unremarkable. The small bowel is normal caliber without inflammatory changes. Incidentally noted is a 1.5 cm submucosal lipoma at the junction of the descending and horizontal segments the duodenum. The appendix is normal caliber. There are mild thickened folds and stranding along the ascending colon. Remainder of the colon wall is contracted but grossly normal.  There are uncomplicated sigmoid diverticula. There is no bowel pneumatosis or evidence of perforation. Vascular/Lymphatic: Numerous pelvic phleboliths. There is moderate aortoiliac calcific plaque. Moderate calcification in the proximal right renal artery. No significant calcification in the SMA and celiac artery. No adenopathy is seen. Reproductive: Uterus and bilateral adnexa are unremarkable. Other: There small umbilical fat hernia. There is no incarcerated hernia. There is no free air, free hemorrhage or free fluid. Musculoskeletal: Mild degenerative disc change and vacuum phenomena at L5-S1. No acute or other significant osseous findings. IMPRESSION: 1. Evidence of at least a mild ascending colitis, without evidence of bowel obstruction or perforation. 2. Etiology is most likely infectious or inflammatory. Ischemic etiology is not strictly excluded but there are no calcific plaques in the SMA. There is also no pneumatosis. 3. Uncomplicated sigmoid diverticula. 4. Mildly steatotic liver. 5. Aortic atherosclerosis. 6. Small umbilical fat hernia. Aortic Atherosclerosis (ICD10-I70.0). Electronically Signed   By: Almira Bar M.D.   On: 07/19/2022 00:34      Impression / Plan:   Tamara Hoffman is a 57 y.o. y/o female with a history of NSAID related gastric ulcers seen on upper endoscopy in March 2024 and a normal colonoscopy when performed at the same time presents to the emergency room with nausea vomiting abdominal pain followed by coffee-ground emesis.  Likely has had an upper GI bleed she also has acute kidney injury.  Colitis seen on the CAT scan could be either infectious or NSAID related if she continues to take NSAIDs.   Plan 1.  Stop all NSAIDs as it can cause gastric ulcers as well as colitis 2.  If having diarrhea check stool for C. difficile/infection, none at present 3.  IV PPI 4.  EGD tomorrow if negative will require capsule study of the small bowel   I have discussed alternative  options, risks & benefits,  which include, but are not limited to, bleeding, infection, perforation,respiratory complication & drug reaction.  The patient agrees with this plan & written consent will be obtained.     Thank you for involving me in the care of this patient.      LOS: 0 days   Wyline Mood, MD  07/19/2022, 9:09 AM

## 2022-07-19 NOTE — Assessment & Plan Note (Addendum)
BUN/creatinine 36/2.31.  Baseline creatinine 0.93 just 5 months prior Started improving. IV hydration with LR and monitor, avoiding nephrotoxins

## 2022-07-20 ENCOUNTER — Inpatient Hospital Stay: Payer: 59 | Admitting: Certified Registered"

## 2022-07-20 ENCOUNTER — Encounter
Admission: EM | Disposition: A | Discharge status: Home / Self Care | Payer: Self-pay | Source: Home / Self Care | Attending: Internal Medicine

## 2022-07-20 DIAGNOSIS — N179 Acute kidney failure, unspecified: Secondary | ICD-10-CM

## 2022-07-20 DIAGNOSIS — K921 Melena: Secondary | ICD-10-CM | POA: Diagnosis not present

## 2022-07-20 DIAGNOSIS — R1013 Epigastric pain: Secondary | ICD-10-CM | POA: Diagnosis not present

## 2022-07-20 DIAGNOSIS — K529 Noninfective gastroenteritis and colitis, unspecified: Secondary | ICD-10-CM | POA: Diagnosis not present

## 2022-07-20 DIAGNOSIS — I10 Essential (primary) hypertension: Secondary | ICD-10-CM | POA: Diagnosis not present

## 2022-07-20 DIAGNOSIS — K2960 Other gastritis without bleeding: Secondary | ICD-10-CM | POA: Diagnosis not present

## 2022-07-20 DIAGNOSIS — K269 Duodenal ulcer, unspecified as acute or chronic, without hemorrhage or perforation: Secondary | ICD-10-CM | POA: Diagnosis not present

## 2022-07-20 DIAGNOSIS — F17210 Nicotine dependence, cigarettes, uncomplicated: Secondary | ICD-10-CM | POA: Diagnosis not present

## 2022-07-20 HISTORY — PX: ESOPHAGOGASTRODUODENOSCOPY (EGD) WITH PROPOFOL: SHX5813

## 2022-07-20 LAB — BASIC METABOLIC PANEL
Anion gap: 12 (ref 5–15)
BUN: 22 mg/dL — ABNORMAL HIGH (ref 6–20)
CO2: 23 mmol/L (ref 22–32)
Calcium: 9.3 mg/dL (ref 8.9–10.3)
Chloride: 100 mmol/L (ref 98–111)
Creatinine, Ser: 1.23 mg/dL — ABNORMAL HIGH (ref 0.44–1.00)
GFR, Estimated: 52 mL/min — ABNORMAL LOW (ref >60)
Glucose, Bld: 106 mg/dL — ABNORMAL HIGH (ref 70–99)
Potassium: 4.2 mmol/L (ref 3.5–5.1)
Sodium: 135 mmol/L (ref 135–145)

## 2022-07-20 LAB — CBC
HCT: 45.4 % (ref 36.0–46.0)
Hemoglobin: 15 g/dL (ref 12.0–15.0)
MCH: 31.1 pg (ref 26.0–34.0)
MCHC: 33 g/dL (ref 30.0–36.0)
MCV: 94.2 fL (ref 80.0–100.0)
Platelets: 296 10*3/uL (ref 150–400)
RBC: 4.82 MIL/uL (ref 3.87–5.11)
RDW: 13.6 % (ref 11.5–15.5)
WBC: 8.7 10*3/uL (ref 4.0–10.5)
nRBC: 0 % (ref 0.0–0.2)

## 2022-07-20 LAB — PROCALCITONIN: Procalcitonin: <0.1 ng/mL

## 2022-07-20 LAB — HEMOGLOBIN: Hemoglobin: 14.1 g/dL (ref 12.0–15.0)

## 2022-07-20 LAB — HIV ANTIBODY (ROUTINE TESTING W REFLEX): HIV Screen 4th Generation wRfx: NONREACTIVE

## 2022-07-20 SURGERY — ESOPHAGOGASTRODUODENOSCOPY (EGD) WITH PROPOFOL
Anesthesia: General

## 2022-07-20 MED ORDER — PROPOFOL 10 MG/ML IV BOLUS
INTRAVENOUS | Status: DC | PRN
  Administered 2022-07-20: 70 mg via INTRAVENOUS
  Administered 2022-07-20: 30 mg via INTRAVENOUS

## 2022-07-20 MED ORDER — PROPOFOL 500 MG/50ML IV EMUL
INTRAVENOUS | Status: DC | PRN
  Administered 2022-07-20: 120 ug/kg/min via INTRAVENOUS

## 2022-07-20 MED ORDER — GLYCOPYRROLATE 0.2 MG/ML IJ SOLN
INTRAMUSCULAR | Status: AC
  Filled 2022-07-20: qty 1

## 2022-07-20 MED ORDER — PROPOFOL 1000 MG/100ML IV EMUL
INTRAVENOUS | Status: AC
  Filled 2022-07-20: qty 100

## 2022-07-20 MED ORDER — CIPROFLOXACIN HCL 500 MG PO TABS: 500.0000 mg | ORAL_TABLET | Freq: Two times a day (BID) | ORAL | 0 refills | Status: AC

## 2022-07-20 MED ORDER — LIDOCAINE 2% (20 MG/ML) 5 ML SYRINGE
INTRAMUSCULAR | Status: DC | PRN
  Administered 2022-07-20: 20 mg via INTRAVENOUS

## 2022-07-20 MED ORDER — ONDANSETRON HCL 4 MG/2ML IJ SOLN
INTRAMUSCULAR | Status: DC | PRN
  Administered 2022-07-20: 4 mg via INTRAVENOUS

## 2022-07-20 MED ORDER — MIDAZOLAM HCL 2 MG/2ML IJ SOLN
INTRAMUSCULAR | Status: AC
  Filled 2022-07-20: qty 2

## 2022-07-20 MED ORDER — MIDAZOLAM HCL 5 MG/5ML IJ SOLN
INTRAMUSCULAR | Status: DC | PRN
  Administered 2022-07-20: 2 mg via INTRAVENOUS

## 2022-07-20 MED ORDER — SODIUM CHLORIDE 0.9 % IV SOLN
INTRAVENOUS | Status: DC
  Administered 2022-07-20: 20 mL/h via INTRAVENOUS

## 2022-07-20 MED ORDER — LIDOCAINE HCL (PF) 2 % IJ SOLN
INTRAMUSCULAR | Status: AC
  Filled 2022-07-20: qty 5

## 2022-07-20 MED ORDER — GLYCOPYRROLATE 0.2 MG/ML IJ SOLN
INTRAMUSCULAR | Status: DC | PRN
  Administered 2022-07-20: .2 mg via INTRAVENOUS

## 2022-07-20 MED ORDER — METRONIDAZOLE 500 MG PO TABS: 500.0000 mg | ORAL_TABLET | Freq: Three times a day (TID) | ORAL | 0 refills | Status: AC

## 2022-07-20 NOTE — Op Note (Signed)
[facility] Gastroenterology Patient Name: Tamara Hoffman Procedure Date: 07/20/2022 10:56 AM MRN: [id number] Account #: [id number] Date of Birth: September 08, 1965 Admit Type: Inpatient Age: 57 Room: [facility] ENDO ROOM 1 Gender: Female Note Status: Finalized Instrument Name: Upper Endoscope [id number] Procedure:             Upper GI endoscopy Indications:           Epigastric abdominal pain, Coffee-ground emesis,                         Nausea with vomiting Providers:             Toney Reil MD, MD Referring MD:          No Local Md, MD (Referring MD) Medicines:             General Anesthesia Complications:         No immediate complications. Estimated blood loss: None. Procedure:             Pre-Anesthesia Assessment:                        - Prior to the procedure, a History and Physical was                         performed, and patient medications and allergies were                         reviewed. The patient is competent. The risks and                         benefits of the procedure and the sedation options and                         risks were discussed with the patient. All questions                         were answered and informed consent was obtained.                         Patient identification and proposed procedure were                         verified by the physician, the nurse, the                         anesthesiologist, the anesthetist and the technician                         in the pre-procedure area in the procedure room in the                         endoscopy suite. Mental Status Examination: alert and                         oriented. Airway Examination: normal oropharyngeal                         airway and neck mobility. Respiratory Examination:  clear to auscultation. CV Examination: normal.                         Prophylactic Antibiotics: The patient does not require                         prophylactic  antibiotics. Prior Anticoagulants: The                         patient has taken no anticoagulant or antiplatelet                         agents. ASA Grade Assessment: III - A patient with                         severe systemic disease. After reviewing the risks and                         benefits, the patient was deemed in satisfactory                         condition to undergo the procedure. The anesthesia                         plan was to use general anesthesia. Immediately prior                         to administration of medications, the patient was                         re-assessed for adequacy to receive sedatives. The                         heart rate, respiratory rate, oxygen saturations,                         blood pressure, adequacy of pulmonary ventilation, and                         response to care were monitored throughout the                         procedure. The physical status of the patient was                         re-assessed after the procedure.                        After obtaining informed consent, the endoscope was                         passed under direct vision. Throughout the procedure,                         the patient's blood pressure, pulse, and oxygen                         saturations were monitored continuously. The Endoscope  was introduced through the mouth, and advanced to the                         second part of duodenum. The upper GI endoscopy was                         accomplished without difficulty. The patient tolerated                         the procedure well. Findings:      Multiple diffuse erosions without bleeding were found in the duodenal       bulb and in the second portion of the duodenum. Biopsies were taken with       a cold forceps for histology.      A small healed ulcer was found on the greater curvature of the gastric       antrum. The scar tissue was healthy in appearance.      The  gastric body, incisura and gastric antrum were normal. Biopsies were       taken with a cold forceps for histology.      The cardia and gastric fundus were normal on retroflexion.      Esophagogastric landmarks were identified: the gastroesophageal junction       was found at 39 cm from the incisors.      The gastroesophageal junction and examined esophagus were normal. Impression:            - Duodenal erosions without bleeding. Biopsied.                        - Scar in the gastric antrum (greater curvature).                        - Normal gastric body, incisura and antrum. Biopsied.                        - Esophagogastric landmarks identified.                        - Normal gastroesophageal junction and esophagus. Recommendation:        - Await pathology results.                        - Return patient to hospital ward for possible                         discharge same day.                        - Advance diet as tolerated today.                        - Continue present medications.                        - To visualize the small bowel, perform video capsule                         endoscopy at appointment to be scheduled with Dr Tobi Bastos.                        -  Return to GI clinic with Dr Tobi Bastos as previously                         scheduled. Procedure Code(s):     --- Professional ---                        [phone], Esophagogastroduodenoscopy, flexible,                         transoral; with biopsy, single or multiple Diagnosis Code(s):     --- Professional ---                        K26.9, Duodenal ulcer, unspecified as acute or                         chronic, without hemorrhage or perforation                        K31.89, Other diseases of stomach and duodenum                        K92.0, Hematemesis                        R10.13, Epigastric pain                        R11.2, Nausea with vomiting, unspecified CPT copyright 2022 American Medical Association. All rights  reserved. The codes documented in this report are preliminary and upon coder review may  be revised to meet current compliance requirements. Dr. Libby Maw Toney Reil MD, MD 07/20/2022 11:13:06 AM This report has been signed electronically. Number of Addenda: 0 Note Initiated On: 07/20/2022 10:56 AM Estimated Blood Loss:  Estimated blood loss: none.      [facility]

## 2022-07-20 NOTE — Progress Notes (Signed)
Pt very agitated, saying she wants to leave now and will not wait for lab to come draw her blood work. Pt states that the "doctors haven't figured out what I'm having all these abdominal pain issues for and so I'm leaving." Explained to pt that Dr. Nelson Chimes is getting bloodwork to determine what kind of antibiotics to order and pt states she won't take any antibiotics anyway. Notified Dr. Nelson Chimes of what pt said and Dr. Nelson Chimes is agreeable to let pt go and is working on paperwork now. Notified pt who thanked me.

## 2022-07-20 NOTE — Progress Notes (Signed)
  Transition of Care [facility]) Screening Note   Patient Details  Name: MARKERIA DILTS Date of Birth: 09-15-1965   Transition of Care [facility]) CM/SW Contact:    Truddie Hidden, RN Phone Number: 07/20/2022, 12:54 PM    Transition of Care Department [facility]) has reviewed patient and no TOC needs have been identified at this time. We will continue to monitor patient advancement through interdisciplinary progression rounds. If new patient transition needs arise, please place a TOC consult.

## 2022-07-20 NOTE — Anesthesia Postprocedure Evaluation (Signed)
Anesthesia Post Note  Patient: Tamara Hoffman  Procedure(s) Performed: ESOPHAGOGASTRODUODENOSCOPY (EGD) WITH PROPOFOL  Patient location during evaluation: PACU Anesthesia Type: General Level of consciousness: awake and alert Pain management: pain level controlled Vital Signs Assessment: post-procedure vital signs reviewed and stable Respiratory status: spontaneous breathing, nonlabored ventilation, respiratory function stable and patient connected to nasal cannula oxygen Cardiovascular status: blood pressure returned to baseline and stable Postop Assessment: no apparent nausea or vomiting Anesthetic complications: no   No notable events documented.   Last Vitals:  Vitals:   07/20/22 1115 07/20/22 1125  BP: (!) 159/86 (!) 147/81  Pulse: (!) 117 92  Resp: 19 17  Temp: (!) 35.9 C   SpO2: 96% 95%    Last Pain:  Vitals:   07/20/22 1125  TempSrc:   PainSc: 0-No pain                 Yevette Edwards

## 2022-07-20 NOTE — Transfer of Care (Signed)
Immediate Anesthesia Transfer of Care Note  Patient: Tamara Hoffman  Procedure(s) Performed: ESOPHAGOGASTRODUODENOSCOPY (EGD) WITH PROPOFOL  Patient Location: Endoscopy Unit  Anesthesia Type:General  Level of Consciousness: awake, alert , and oriented  Airway & Oxygen Therapy: Patient Spontanous Breathing  Post-op Assessment: Report given to RN and Post -op Vital signs reviewed and stable  Post vital signs: Reviewed  Last Vitals:  Vitals Value Taken Time  BP 159/86 07/20/22 1119  Temp 35.9 C 07/20/22 1115  Pulse 103 07/20/22 1119  Resp 18 07/20/22 1119  SpO2 99 % 07/20/22 1119  Vitals shown include unvalidated device data.  Last Pain:      Patients Stated Pain Goal: 0 (07/18/22 2219)  Complications: No notable events documented.

## 2022-07-20 NOTE — Discharge Summary (Signed)
Physician Discharge Summary   Patient: Tamara Hoffman MRN: [id number] DOB: 1965-07-21  Admit date:     07/18/2022  Discharge date: 07/20/22  Discharge Physician: Arnetha Courser   PCP: Alfredia Ferguson, PA-C   Recommendations at discharge:  Please obtain CBC and BMP in 1 week Follow-up with gastroenterology for capsule endoscopy Follow-up with primary care provider  Discharge Diagnoses: Principal Problem:   Hematochezia Active Problems:   History of peptic ulcer disease   Colitis   AKI (acute kidney injury) (HCC)   Primary hypertension   Rectal bleeding   Epigastric pain   Hospital Course: Taken from H&P.  : Tamara Hoffman is a 57 y.o. female with medical history significant for Hypertension, prior history of GI bleed chronic NSAID use with Celebrex with melena earlier in the year for which she underwent upper and lower endoscopy in March 2024 With normal colonoscopy and with nonbleeding superficial gastric ulcers on endoscopy, who presents to theThe ED with sudden onset epigastric pain starting a few hours prior to admission, followed by nausea, nonbloody nonbilious and non-coffee-ground vomiting and then followed by a large amount of rectal bleeding.  Patient has a prior history of alcohol use but stopped going months ago.  ED course and data review.  Vitals with mild tachycardia at 108, labs pertinent for leukocytosis at 13,000, hemoglobin normal at 14.6, bicarb 18.  Lipase and LFT normal.  EKG with sinus tachycardia and no significant ST changes. CT abdomen and pelvis shows the following 1. Evidence of at least a mild ascending colitis, without evidence of bowel obstruction or perforation. 2. Etiology is most likely infectious or inflammatory. Ischemic etiology is not strictly excluded but there are no calcific plaques in the SMA. There is also no pneumatosis. 3. Uncomplicated sigmoid diverticula. 4. Mildly steatotic liver. 5. Aortic atherosclerosis. 6. Small umbilical fat  hernia.  Patient was started on Zosyn and GI was consulted.  5/19: Vital stable.  Repeat hemoglobin at 13.2.  No more active bleeding.  Improving abdominal pain.  Pending GI evaluation.  5/20: Remained hemodynamically stable.  No more bleeding and hemoglobin improving.  EGD today with a small nonbleeding duodenal ulcer and some scarring in stomach.  Biopsies were taken.  GI is planning to do capsule endoscopy as outpatient.  Patient should continue with Protonix and need to have a close follow-up with gastroenterology for further evaluation and management.  As patient continued to have some crampy abdominal pain, no diarrhea, nausea or vomiting. CT was concerning for colitis.  She was given a 5-day course of ciprofloxacin and Flagyl.  Patient will continue with rest of her home medications and need to have a close follow-up with her providers for further recommendations.    Assessment and Plan: * Hematochezia Acute colitis History of peptic ulcer disease, superficial gastric ulcers 05/2022 S/p  normal colonoscopy and with nonbleeding superficial gastric ulcers on endoscopy March 2024 Started on clear liquid IV Protonix, morphine for pain, IV antiemetics, IV fluids GI consult Serial H&H and transfuse if necessary  AKI (acute kidney injury) (HCC) BUN/creatinine 36/2.31.  Baseline creatinine 0.93 just 5 months prior Started improving. IV hydration with LR and monitor, avoiding nephrotoxins  Primary hypertension Hydralazine IV as needed while n.p.o.   Consultants: Gastroenterology Procedures performed: EGD Disposition: Home Diet recommendation:  Discharge Diet Orders (From admission, onward)     Start     Ordered   07/20/22 0000  Diet - low sodium heart healthy        07/20/22  1242           Cardiac diet DISCHARGE MEDICATION: Allergies as of 07/20/2022       Reactions   Promethazine Swelling   Pt states face turned really red all over and had full body swelling    Promethazine Hcl Swelling        Medication List     STOP taking these medications    hydrOXYzine 25 MG capsule Commonly known as: VISTARIL   sucralfate 1 g tablet Commonly known as: Carafate       TAKE these medications    albuterol 108 (90 Base) MCG/ACT inhaler Commonly known as: VENTOLIN HFA Inhale 2 puffs into the lungs every 6 (six) hours as needed for wheezing or shortness of breath.   busPIRone 5 MG tablet Commonly known as: BUSPAR Take 1 tablet (5 mg total) by mouth 2 (two) times daily. Please make an appointment in office   celecoxib 100 MG capsule Commonly known as: CELEBREX Take 100 mg by mouth 2 (two) times daily.   ciprofloxacin 500 MG tablet Commonly known as: Cipro Take 1 tablet (500 mg total) by mouth 2 (two) times daily for 5 days.   metroNIDAZOLE 500 MG tablet Commonly known as: Flagyl Take 1 tablet (500 mg total) by mouth 3 (three) times daily for 5 days.   omeprazole 20 MG capsule Commonly known as: PRILOSEC Take 1 capsule (20 mg total) by mouth 2 (two) times daily before a meal.   rosuvastatin 5 MG tablet Commonly known as: Crestor Take 1 tablet (5 mg total) by mouth daily. What changed: additional instructions   valsartan-hydrochlorothiazide 320-12.5 MG tablet Commonly known as: DIOVAN-HCT Take 1 tablet by mouth daily.        Follow-up Information     Alfredia Ferguson, PA-C. Schedule an appointment as soon as possible for a visit in 1 week(s).   Specialty: Physician Assistant Contact information: [address] [place] [address] [phone]                Discharge Exam: Ceasar Mons Weights   07/18/22 2220 07/19/22 1541  Weight: 95.3 kg 91.1 kg   General.  Obese lady, in no acute distress. Pulmonary.  Lungs clear bilaterally, normal respiratory effort. CV.  Regular rate and rhythm, no JVD, rub or murmur. Abdomen.  Soft, nontender, nondistended, BS positive. CNS.  Alert and oriented .  No focal neurologic  deficit. Extremities.  No edema, no cyanosis, pulses intact and symmetrical. Psychiatry.  Judgment and insight appears normal.   Condition at discharge: stable  The results of significant diagnostics from this hospitalization (including imaging, microbiology, ancillary and laboratory) are listed below for reference.   Imaging Studies: CT ABDOMEN PELVIS WO CONTRAST  Result Date: 07/19/2022 CLINICAL DATA:  Nausea, vomiting, abdominal pain and bloody diarrhea. EXAM: CT ABDOMEN AND PELVIS WITHOUT CONTRAST TECHNIQUE: Multidetector CT imaging of the abdomen and pelvis was performed following the standard protocol without IV contrast. RADIATION DOSE REDUCTION: This exam was performed according to the departmental dose-optimization program which includes automated exposure control, adjustment of the mA and/or kV according to patient size and/or use of iterative reconstruction technique. COMPARISON:  CT with IV contrast 02/22/2014 FINDINGS: Lower chest: No acute abnormality. Hepatobiliary: Liver is 18 cm in length and mildly steatotic no masses seen without contrast. The gallbladder and bile ducts are unremarkable. Pancreas: Unremarkable without contrast. Spleen: Unremarkable without contrast. Adrenals/Urinary Tract: There is no adrenal mass or contour deforming abnormality of either kidney. There is no urinary  stone or obstruction. The bladder is unremarkable for the degree of distention. Stomach/Bowel: The gastric wall is unremarkable. The small bowel is normal caliber without inflammatory changes. Incidentally noted is a 1.5 cm submucosal lipoma at the junction of the descending and horizontal segments the duodenum. The appendix is normal caliber. There are mild thickened folds and stranding along the ascending colon. Remainder of the colon wall is contracted but grossly normal. There are uncomplicated sigmoid diverticula. There is no bowel pneumatosis or evidence of perforation. Vascular/Lymphatic: Numerous  pelvic phleboliths. There is moderate aortoiliac calcific plaque. Moderate calcification in the proximal right renal artery. No significant calcification in the SMA and celiac artery. No adenopathy is seen. Reproductive: Uterus and bilateral adnexa are unremarkable. Other: There small umbilical fat hernia. There is no incarcerated hernia. There is no free air, free hemorrhage or free fluid. Musculoskeletal: Mild degenerative disc change and vacuum phenomena at L5-S1. No acute or other significant osseous findings. IMPRESSION: 1. Evidence of at least a mild ascending colitis, without evidence of bowel obstruction or perforation. 2. Etiology is most likely infectious or inflammatory. Ischemic etiology is not strictly excluded but there are no calcific plaques in the SMA. There is also no pneumatosis. 3. Uncomplicated sigmoid diverticula. 4. Mildly steatotic liver. 5. Aortic atherosclerosis. 6. Small umbilical fat hernia. Aortic Atherosclerosis (ICD10-I70.0). Electronically Signed   By: Almira Bar M.D.   On: 07/19/2022 00:34   NM Hepato W/EjeCT Fract  Result Date: 06/23/2022 CLINICAL DATA:  Epigastric abdominal pain EXAM: NUCLEAR MEDICINE HEPATOBILIARY IMAGING WITH GALLBLADDER EF TECHNIQUE: Sequential images of the abdomen were obtained out to 60 minutes following intravenous administration of radiopharmaceutical. After oral ingestion of Ensure, gallbladder ejection fraction was determined. At 60 min, normal ejection fraction is greater than 33%. RADIOPHARMACEUTICALS:  5.21 mCi Tc-27m  Choletec IV COMPARISON:  None Available. FINDINGS: Normal tracer extraction from bloodstream indicating normal hepatocellular function. Normal excretion of tracer into biliary tree. Gallbladder visualized at 13 min. Small bowel visualized at 17 min. No hepatic retention of tracer. Subjectively normal emptying of tracer from gallbladder following fatty meal stimulation. Calculated gallbladder ejection fraction is 41%, normal.  Patient reported no symptoms following Ensure ingestion. Normal gallbladder ejection fraction following Ensure ingestion is greater than 33% at 1 hour. IMPRESSION: Normal exam. Electronically Signed   By: Ulyses Southward M.D.   On: 06/23/2022 12:23    Microbiology: Results for orders placed or performed in visit on 05/16/19  Urine Culture     Status: Abnormal   Collection Time: 05/16/19  4:00 PM   Specimen: Urine, Clean Catch   UC  Result Value Ref Range Status   Urine Culture, Routine Final report (A)  Final   Organism ID, Bacteria Escherichia coli (A)  Final    Comment: 50,000-100,000 colony forming units per mL Cefazolin <=4 ug/mL Cefazolin with an MIC <=16 predicts susceptibility to the oral agents cefaclor, cefdinir, cefpodoxime, cefprozil, cefuroxime, cephalexin, and loracarbef when used for therapy of uncomplicated urinary tract infections due to E. coli, Klebsiella pneumoniae, and Proteus mirabilis.    Antimicrobial Susceptibility Comment  Final    Comment:       ** S = Susceptible; I = Intermediate; R = Resistant **                    P = Positive; N = Negative             MICS are expressed in micrograms per mL    Antibiotic  RSLT#1    RSLT#2    RSLT#3    RSLT#4 Amoxicillin/Clavulanic Acid    S Ampicillin                     R Cefepime                       S Ceftriaxone                    S Cefuroxime                     S Ciprofloxacin                  S Ertapenem                      S Gentamicin                     S Imipenem                       S Levofloxacin                   S Meropenem                      S Nitrofurantoin                 S Piperacillin/Tazobactam        S Tetracycline                   S Tobramycin                     S Trimethoprim/Sulfa             S     Labs: CBC: Recent Labs  Lab 07/18/22 2228 07/19/22 0645 07/19/22 1048 07/19/22 1651 07/19/22 2249 07/20/22 0615 07/20/22 1237  WBC 13.1*  --   --   --   --   --   8.7  NEUTROABS 10.9*  --   --   --   --   --   --   HGB 14.6   < > 12.9 13.1 13.3 14.1 15.0  HCT 44.1  --   --   --   --   --  45.4  MCV 94.4  --   --   --   --   --  94.2  PLT 298  --   --   --   --   --  296   < > = values in this interval not displayed.   Basic Metabolic Panel: Recent Labs  Lab 07/18/22 2228 07/19/22 0645 07/20/22 1237  NA 137 136 135  K 4.2 4.2 4.2  CL 102 104 100  CO2 18* 21* 23  GLUCOSE 137* 126* 106*  BUN 36* 30* 22*  CREATININE 2.31* 1.79* 1.23*  CALCIUM 10.0 8.9 9.3   Liver Function Tests: Recent Labs  Lab 07/18/22 2228  AST 29  ALT 27  ALKPHOS 105  BILITOT 0.8  PROT 8.2*  ALBUMIN 4.5   CBG: No results for input(s): "GLUCAP" in the last 168 hours.  Discharge time spent: greater than 30 minutes.  This record has been created using Conservation officer, historic buildings. Errors have been sought and corrected,but may not always be located. Such creation errors do not reflect  on the standard of care.   Signed: Arnetha Courser, MD [facility] 07/20/2022

## 2022-07-20 NOTE — Anesthesia Preprocedure Evaluation (Signed)
Anesthesia Evaluation  Patient identified by MRN, date of birth, ID band Patient awake    Reviewed: Allergy & Precautions, H&P , NPO status , Patient's Chart, lab work & pertinent test results, reviewed documented beta blocker date and time   Airway Mallampati: II   Neck ROM: full    Dental  (+) Poor Dentition   Pulmonary COPD, Current Smoker and Patient abstained from smoking.   Pulmonary exam normal        Cardiovascular Exercise Tolerance: Good hypertension, On Medications + angina with exertion Normal cardiovascular exam+ Valvular Problems/Murmurs  Rhythm:regular Rate:Normal     Neuro/Psych  PSYCHIATRIC DISORDERS Anxiety Depression    negative neurological ROS     GI/Hepatic Neg liver ROS, PUD,GERD  Medicated,,  Endo/Other  negative endocrine ROS    Renal/GU Renal disease  negative genitourinary   Musculoskeletal   Abdominal   Peds  Hematology negative hematology ROS (+)   Anesthesia Other Findings Past Medical History: No date: Arthritis No date: GERD (gastroesophageal reflux disease) No date: Heart murmur No date: Hypertension Past Surgical History: 05/26/2022: COLONOSCOPY WITH PROPOFOL; N/A     Comment:  Procedure: COLONOSCOPY WITH PROPOFOL;  Surgeon: Wyline Mood, MD;  [facility] ENDOSCOPY;  Service:               Gastroenterology;  Laterality: N/A; 05/26/2022: ESOPHAGOGASTRODUODENOSCOPY (EGD) WITH PROPOFOL; N/A     Comment:  Procedure: ESOPHAGOGASTRODUODENOSCOPY (EGD) WITH               PROPOFOL;  Surgeon: Wyline Mood, MD;  Location: [facility]               ENDOSCOPY;  Service: Gastroenterology;  Laterality: N/A; No date: LEEP BMI    Body Mass Index: 34.48 kg/m     Reproductive/Obstetrics negative OB ROS                             Anesthesia Physical Anesthesia Plan  ASA: 3  Anesthesia Plan: General   Post-op Pain Management:    Induction:   PONV  Risk Score and Plan:   Airway Management Planned:   Additional Equipment:   Intra-op Plan:   Post-operative Plan:   Informed Consent: I have reviewed the patients History and Physical, chart, labs and discussed the procedure including the risks, benefits and alternatives for the proposed anesthesia with the patient or authorized representative who has indicated his/her understanding and acceptance.     Dental Advisory Given  Plan Discussed with: CRNA  Anesthesia Plan Comments:        Anesthesia Quick Evaluation

## 2022-07-21 ENCOUNTER — Telehealth: Payer: Self-pay

## 2022-07-21 ENCOUNTER — Encounter: Payer: Self-pay | Admitting: Gastroenterology

## 2022-07-21 NOTE — Telephone Encounter (Signed)
Per Dr. Allegra Lai EGD report she wants patient to have a video capsule schedule with Dr. Tobi Bastos as outpatient

## 2022-07-21 NOTE — Transitions of Care (Post Inpatient/ED Visit) (Signed)
   07/21/2022  Name: Tamara Hoffman MRN: [id number] DOB: 05-06-1965  Today's TOC FU Call Status: Today's TOC FU Call Status:: Successful TOC FU Call Competed TOC FU Call Complete Date: 07/21/22  Transition Care Management Follow-up Telephone Call Date of Discharge: 07/20/22 Discharge Facility: [facility]) Type of Discharge: Inpatient Admission Primary Inpatient Discharge Diagnosis:: epigastric pain How have you been since you were released from the hospital?: Better Any questions or concerns?: No  Items Reviewed: Did you receive and understand the discharge instructions provided?: Yes Medications obtained,verified, and reconciled?: Yes (Medications Reviewed) Any new allergies since your discharge?: No Dietary orders reviewed?: Yes Do you have support at home?: No  Medications Reviewed Today: Medications Reviewed Today     Reviewed by Karena Addison, LPN (Licensed Practical Nurse) on 07/21/22 at [phone]  Med List Status: <None>   Medication Order Taking? Sig Documenting Provider Last Dose Status Informant  albuterol (VENTOLIN HFA) 108 (90 Base) MCG/ACT inhaler [id number] Yes Inhale 2 puffs into the lungs every 6 (six) hours as needed for wheezing or shortness of breath. Alfredia Ferguson, PA-C Taking Active   busPIRone (BUSPAR) 5 MG tablet [id number] Yes Take 1 tablet (5 mg total) by mouth 2 (two) times daily. Please make an appointment in office Alfredia Ferguson, PA-C Taking Active   celecoxib (CELEBREX) 100 MG capsule [id number] Yes Take 100 mg by mouth 2 (two) times daily. [provider] Taking Active   ciprofloxacin (CIPRO) 500 MG tablet [id number] Yes Take 1 tablet (500 mg total) by mouth 2 (two) times daily for 5 days. Arnetha Courser, MD Taking Active   metroNIDAZOLE (FLAGYL) 500 MG tablet [id number] Yes Take 1 tablet (500 mg total) by mouth 3 (three) times daily for 5 days. Arnetha Courser, MD Taking Active   omeprazole (PRILOSEC) 20 MG capsule  [id number] Yes Take 1 capsule (20 mg total) by mouth 2 (two) times daily before a meal. Alfredia Ferguson, PA-C Taking Active   rosuvastatin (CRESTOR) 5 MG tablet [id number] Yes Take 1 tablet (5 mg total) by mouth daily.  Patient taking differently: Take 5 mg by mouth daily. Pt states that she stopped taking d/t med making her dizziness. Spoke with PCP who told her to f/u with cards, but she hasn't gotten a chance to. PCP prescribed med.   Alfredia Ferguson, PA-C Taking Active   valsartan-hydrochlorothiazide (DIOVAN-HCT) 320-12.5 MG tablet [id number] Yes Take 1 tablet by mouth daily. Alfredia Ferguson, PA-C Taking Active             Home Care and Equipment/Supplies: Were Home Health Services Ordered?: NA Any new equipment or medical supplies ordered?: NA  Functional Questionnaire: Do you need assistance with bathing/showering or dressing?: No Do you need assistance with meal preparation?: No Do you need assistance with eating?: No Do you have difficulty maintaining continence: No Do you need assistance with getting out of bed/getting out of a chair/moving?: No Do you have difficulty managing or taking your medications?: No  Follow up appointments reviewed: PCP Follow-up appointment confirmed?: Yes Date of PCP follow-up appointment?: 07/24/22 Follow-up Provider: [facility] Follow-up appointment confirmed?: NA Do you need transportation to your follow-up appointment?: No Do you understand care options if your condition(s) worsen?: Yes-patient verbalized understanding    SIGNATURE Karena Addison, LPN [facility] Nurse Health Advisor Direct Dial [phone]

## 2022-07-22 NOTE — Telephone Encounter (Signed)
Dr. Tobi Bastos, is it okay fore me to order the capsule study for patient?

## 2022-07-22 NOTE — Telephone Encounter (Signed)
Sent patient a message through MyChart to ask if she would like for me to schedule her the capsule study since I was not able to reach her by phone.

## 2022-07-23 LAB — SURGICAL PATHOLOGY

## 2022-07-24 ENCOUNTER — Ambulatory Visit (INDEPENDENT_AMBULATORY_CARE_PROVIDER_SITE_OTHER): Payer: 59 | Admitting: Physician Assistant

## 2022-07-24 ENCOUNTER — Encounter: Payer: Self-pay | Admitting: Physician Assistant

## 2022-07-24 VITALS — BP 138/74 | HR 103 | Temp 98.7°F | Resp 15 | Ht 64.0 in | Wt 209.3 lb

## 2022-07-24 DIAGNOSIS — N179 Acute kidney failure, unspecified: Secondary | ICD-10-CM | POA: Diagnosis not present

## 2022-07-24 DIAGNOSIS — I10 Essential (primary) hypertension: Secondary | ICD-10-CM | POA: Diagnosis not present

## 2022-07-24 NOTE — Progress Notes (Signed)
I,[name],acting as a Neurosurgeon for [employer], PA-C.,have documented all relevant documentation on the behalf of Tamara Ferguson, PA-C,as directed by  Tamara Ferguson, PA-C while in the presence of Tamara Ferguson, PA-C.    Established patient visit   Patient: Tamara Hoffman   DOB: 1966/02/16   57 y.o. Female  MRN: [id number] Visit Date: 07/24/2022  Today's healthcare provider: Alfredia Ferguson, PA-C   Cc. F/u hospitalization  Subjective    HPI Patient reports no questions on concerns after hospitalization, says she feels good. Would like to know what to do now about her BP medication.  Follow up Hospitalization  Patient was admitted to [facility] on 05/182024 and discharged on 07/20/2022. She was treated for Hematochezia. Treatment for this included Esophagogastroduodenoscopy. Telephone follow up was done on 07/22/2022 She reports good compliance with treatment. She reports this condition is improved.  ----------------------------------------------------------------------------------------- -   Medications: Outpatient Medications Prior to Visit  Medication Sig   albuterol (VENTOLIN HFA) 108 (90 Base) MCG/ACT inhaler Inhale 2 puffs into the lungs every 6 (six) hours as needed for wheezing or shortness of breath.   busPIRone (BUSPAR) 5 MG tablet Take 1 tablet (5 mg total) by mouth 2 (two) times daily. Please make an appointment in office   celecoxib (CELEBREX) 100 MG capsule Take 100 mg by mouth 2 (two) times daily.   ciprofloxacin (CIPRO) 500 MG tablet Take 1 tablet (500 mg total) by mouth 2 (two) times daily for 5 days.   metroNIDAZOLE (FLAGYL) 500 MG tablet Take 1 tablet (500 mg total) by mouth 3 (three) times daily for 5 days.   omeprazole (PRILOSEC) 20 MG capsule Take 1 capsule (20 mg total) by mouth 2 (two) times daily before a meal.   rosuvastatin (CRESTOR) 5 MG tablet Take 1 tablet (5 mg total) by mouth daily. (Patient taking differently: Take 5 mg by mouth daily. Pt states  that she stopped taking d/t med making her dizziness. Spoke with PCP who told her to f/u with cards, but she hasn't gotten a chance to. PCP prescribed med.)   valsartan-hydrochlorothiazide (DIOVAN-HCT) 320-12.5 MG tablet Take 1 tablet by mouth daily. (Patient not taking: Reported on 07/24/2022)   No facility-administered medications prior to visit.    Review of Systems  Constitutional:  Negative for fatigue and fever.  Respiratory:  Negative for cough and shortness of breath.   Cardiovascular:  Negative for chest pain and leg swelling.  Gastrointestinal:  Negative for abdominal pain.  Neurological:  Negative for dizziness and headaches.       Objective    BP 138/74 (BP Location: Left Arm, Patient Position: Sitting, Cuff Size: Normal)   Pulse (!) 103   Temp 98.7 F (37.1 C) (Oral)   Resp 15   Ht 5\' 4"  (1.626 m)   Wt 209 lb 4.8 oz (94.9 kg)   LMP  (LMP Unknown)   SpO2 98%   BMI 35.93 kg/m  Blood pressure 138/74, pulse (!) 103, temperature 98.7 F (37.1 C), temperature source Oral, resp. rate 15, height 5\' 4"  (1.626 m), weight 209 lb 4.8 oz (94.9 kg), SpO2 98 %.   Physical Exam Vitals reviewed.  Constitutional:      Appearance: She is not ill-appearing.  HENT:     Head: Normocephalic.  Eyes:     Conjunctiva/sclera: Conjunctivae normal.  Cardiovascular:     Rate and Rhythm: Normal rate.  Pulmonary:     Effort: Pulmonary effort is normal. No respiratory distress.  Neurological:  General: No focal deficit present.     Mental Status: She is alert and oriented to person, place, and time.  Psychiatric:        Mood and Affect: Mood normal.        Behavior: Behavior normal.      No results found for any visits on 07/24/22.  Assessment & Plan     Problem List Items Addressed This Visit       Cardiovascular and Mediastinum   Primary hypertension - Primary    Gradually add back valsartan/hctz  Start by cutting in 1/2 monitor BP at home  As BP increases can add back  full tablet.  Goal is 120s/70s-80s         Genitourinary   AKI (acute kidney injury) (HCC)   Relevant Orders   Comprehensive Metabolic Panel (CMET)   2. AKI (acute kidney injury) (HCC) Repeat cmp in 2-4 weeks  Return if high BP at home and need guidance , otherwise next appt already sched.     I, Tamara Ferguson, PA-C have reviewed all documentation for this visit. The documentation on  07/24/22   for the exam, diagnosis, procedures, and orders are all accurate and complete. Tamara Ferguson, PA-C [facility] [address] [place], [place], [address] Office: [phone] Fax: [phone]   [facility]

## 2022-07-24 NOTE — Assessment & Plan Note (Signed)
Gradually add back valsartan/hctz  Start by cutting in 1/2 monitor BP at home  As BP increases can add back full tablet.  Goal is 120s/70s-80s

## 2022-07-29 NOTE — Telephone Encounter (Signed)
Calle dpatient again so I could schedule the capsule study and to give her the pathology result and she did not pick up. I sent her another MyChart message with hopes of her reading it and giving [place] a call to schedule. I will also send her a letter.

## 2022-08-18 ENCOUNTER — Ambulatory Visit: Payer: 59 | Admitting: Internal Medicine

## 2022-08-21 ENCOUNTER — Ambulatory Visit: Payer: 59 | Admitting: Physician Assistant

## 2022-08-21 NOTE — Progress Notes (Deleted)
     I,Ninette Cotta,acting as a Neurosurgeon for [employer], PA-C.,have documented all relevant documentation on the behalf of Alfredia Ferguson, PA-C,as directed by  Alfredia Ferguson, PA-C while in the presence of Alfredia Ferguson, PA-C.   Established patient visit   Patient: Tamara Hoffman   DOB: 02-13-1966   57 y.o. Female  MRN: [id number] Visit Date: 08/21/2022  Today's healthcare provider: Alfredia Ferguson, PA-C   No chief complaint on file.  Subjective    HPI  ***  Medications: Outpatient Medications Prior to Visit  Medication Sig   albuterol (VENTOLIN HFA) 108 (90 Base) MCG/ACT inhaler Inhale 2 puffs into the lungs every 6 (six) hours as needed for wheezing or shortness of breath.   busPIRone (BUSPAR) 5 MG tablet Take 1 tablet (5 mg total) by mouth 2 (two) times daily. Please make an appointment in office   celecoxib (CELEBREX) 100 MG capsule Take 100 mg by mouth 2 (two) times daily.   omeprazole (PRILOSEC) 20 MG capsule Take 1 capsule (20 mg total) by mouth 2 (two) times daily before a meal.   rosuvastatin (CRESTOR) 5 MG tablet Take 1 tablet (5 mg total) by mouth daily. (Patient taking differently: Take 5 mg by mouth daily. Pt states that she stopped taking d/t med making her dizziness. Spoke with PCP who told her to f/u with cards, but she hasn't gotten a chance to. PCP prescribed med.)   valsartan-hydrochlorothiazide (DIOVAN-HCT) 320-12.5 MG tablet Take 1 tablet by mouth daily. (Patient not taking: Reported on 07/24/2022)   No facility-administered medications prior to visit.    Review of Systems  {Labs  Heme  Chem  Endocrine  Serology  Results Review (optional):23779}   Objective    LMP  (LMP Unknown)  {Show previous Sarha Bartelt signs (optional):23777}  Physical Exam  ***  No results found for any visits on 08/21/22.  Assessment & Plan     ***  No follow-ups on file.      {provider attestation***:1}   Alfredia Ferguson, PA-C  [facility] [phone] (phone) [phone] (fax)  [facility]

## 2022-08-25 ENCOUNTER — Telehealth: Payer: Self-pay | Admitting: Physician Assistant

## 2022-09-10 ENCOUNTER — Other Ambulatory Visit: Payer: Self-pay | Admitting: Physician Assistant

## 2022-09-10 DIAGNOSIS — F411 Generalized anxiety disorder: Secondary | ICD-10-CM

## 2022-09-11 ENCOUNTER — Ambulatory Visit: Payer: 59 | Admitting: Cardiology

## 2022-09-24 ENCOUNTER — Other Ambulatory Visit: Payer: Self-pay | Admitting: Physician Assistant

## 2022-09-24 DIAGNOSIS — F411 Generalized anxiety disorder: Secondary | ICD-10-CM

## 2022-09-28 ENCOUNTER — Ambulatory Visit: Payer: 59 | Admitting: Physician Assistant

## 2022-10-08 ENCOUNTER — Ambulatory Visit: Payer: 59 | Admitting: Family Medicine

## 2022-10-13 ENCOUNTER — Ambulatory Visit: Payer: 59 | Admitting: Family Medicine

## 2022-10-22 ENCOUNTER — Ambulatory Visit: Payer: 59 | Admitting: Family Medicine

## 2022-11-17 ENCOUNTER — Ambulatory Visit: Payer: 59 | Attending: Cardiology | Admitting: Cardiology

## 2022-11-18 ENCOUNTER — Encounter: Payer: Self-pay | Admitting: Cardiology

## 2022-12-03 ENCOUNTER — Other Ambulatory Visit: Payer: Self-pay | Admitting: Physician Assistant

## 2022-12-03 ENCOUNTER — Ambulatory Visit: Payer: Self-pay | Admitting: *Deleted

## 2022-12-03 DIAGNOSIS — F411 Generalized anxiety disorder: Secondary | ICD-10-CM

## 2022-12-03 DIAGNOSIS — R062 Wheezing: Secondary | ICD-10-CM

## 2022-12-03 MED ORDER — CELECOXIB 100 MG PO CAPS: 100.0000 mg | ORAL_CAPSULE | Freq: Two times a day (BID) | ORAL | 2 refills | Status: AC

## 2022-12-03 MED ORDER — BUSPIRONE HCL 5 MG PO TABS
5.0000 mg | ORAL_TABLET | Freq: Two times a day (BID) | ORAL | 0 refills | Status: AC
Start: 2022-12-03 — End: ?

## 2022-12-03 MED ORDER — ALBUTEROL SULFATE HFA 108 (90 BASE) MCG/ACT IN AERS
2.0000 | INHALATION_SPRAY | Freq: Four times a day (QID) | RESPIRATORY_TRACT | 2 refills | Status: AC | PRN
Start: 2022-12-03 — End: ?

## 2022-12-03 NOTE — Telephone Encounter (Signed)
Patient aware of providers message. Reports she is getting a COVID test done in the morning at [facility]

## 2022-12-03 NOTE — Telephone Encounter (Signed)
Medication Refill - Medication: albuterol (VENTOLIN HFA) 108 (90 Base) MCG/ACT inhaler, things that go inside her nebulizer, busPIRone (BUSPAR) 5 MG tablet , celecoxib (CELEBREX) 100 MG capsule   Has the patient contacted their pharmacy? No. (Agent: If no, request that the patient contact the pharmacy for the refill. If patient does not wish to contact the pharmacy document the reason why and proceed with request.)   Preferred Pharmacy (with phone number or street name):  [facility] [address], [place] - [address]  [address] [place] [address]  Phone: [phone] Fax: [phone]  Hours: Not open 24 hours   Has the patient been seen for an appointment in the last year OR does the patient have an upcoming appointment? Yes.    Agent: Please be advised that RX refills may take up to 3 business days. We ask that you follow-up with your pharmacy.

## 2022-12-03 NOTE — Telephone Encounter (Signed)
Requested Prescriptions  Pending Prescriptions Disp Refills   albuterol (VENTOLIN HFA) 108 (90 Base) MCG/ACT inhaler 8 g 2    Sig: Inhale 2 puffs into the lungs every 6 (six) hours as needed for wheezing or shortness of breath.     Pulmonology:  Beta Agonists 2 Passed - 12/03/2022  9:33 AM      Passed - Last BP in normal range    BP Readings from Last 1 Encounters:  07/24/22 138/74         Passed - Last Heart Rate in normal range    Pulse Readings from Last 1 Encounters:  07/24/22 (!) 103         Passed - Valid encounter within last 12 months    Recent Outpatient Visits           4 months ago Primary hypertension   [place] [facility] [place], [place], PA-C   6 months ago Primary hypertension   [place] [facility] [place], [place], PA-C   6 months ago Rectal bleeding   [place] [facility] Alfredia Ferguson, PA-C   6 months ago Hypertensive urgency   [place] [facility] Alfredia Ferguson, PA-C   9 months ago Hypertensive urgency   [place] [facility] [place], Lillia Abed, PA-C               busPIRone (BUSPAR) 5 MG tablet 180 tablet 0    Sig: Take 1 tablet (5 mg total) by mouth 2 (two) times daily. Please make an appointment in office     Psychiatry: Anxiolytics/Hypnotics - Non-controlled Passed - 12/03/2022  9:33 AM      Passed - Valid encounter within last 12 months    Recent Outpatient Visits           4 months ago Primary hypertension   [place] [facility] Alfredia Ferguson, PA-C   6 months ago Primary hypertension   [place] [facility] Alfredia Ferguson, PA-C   6 months ago Rectal bleeding   [place] [facility] Alfredia Ferguson, PA-C   6 months ago Hypertensive urgency   [place] [facility] Alfredia Ferguson, PA-C   9 months ago Hypertensive urgency   [place] [facility] Alfredia Ferguson,  PA-C               celecoxib (CELEBREX) 100 MG capsule 60 capsule 2    Sig: Take 1 capsule (100 mg total) by mouth 2 (two) times daily.     Analgesics:  COX2 Inhibitors Failed - 12/03/2022  9:33 AM      Failed - Manual Review: Labs are only required if the patient has taken medication for more than 8 weeks.      Failed - Cr in normal range and within 360 days    Creatinine  Date Value Ref Range Status  02/22/2014 0.72 0.60 - 1.30 mg/dL Final   Creatinine, Ser  Date Value Ref Range Status  07/20/2022 1.23 (H) 0.44 - 1.00 mg/dL Final         Passed - HGB in normal range and within 360 days    Hemoglobin  Date Value Ref Range Status  07/20/2022 15.0 12.0 - 15.0 g/dL Final  09/81/1914 78.2 11.1 - 15.9 g/dL Final         Passed - HCT in normal range and within 360 days    HCT  Date Value Ref Range Status  07/20/2022 45.4 36.0 -  46.0 % Final   Hematocrit  Date Value Ref Range Status  05/19/2022 43.9 34.0 - 46.6 % Final         Passed - AST in normal range and within 360 days    AST  Date Value Ref Range Status  07/18/2022 29 15 - 41 U/L Final   SGOT(AST)  Date Value Ref Range Status  02/22/2014 26 15 - 37 Unit/L Final         Passed - ALT in normal range and within 360 days    ALT  Date Value Ref Range Status  07/18/2022 27 0 - 44 U/L Final   SGPT (ALT)  Date Value Ref Range Status  02/22/2014 17 U/L Final    Comment:    14-63 NOTE: New Reference Range 09/19/13          Passed - eGFR is 30 or above and within 360 days    EGFR (African American)  Date Value Ref Range Status  02/22/2014 >60 >28mL/min Final   GFR calc Af Amer  Date Value Ref Range Status  10/26/2018 >60 >60 mL/min Final   EGFR (Non-African Amer.)  Date Value Ref Range Status  02/22/2014 >60 >74mL/min Final    Comment:    eGFR values <87mL/min/1.73 m2 may be an indication of chronic kidney disease (CKD). Calculated eGFR, using the MRDR Study equation, is useful in  patients with  stable renal function. The eGFR calculation will not be reliable in acutely ill patients when serum creatinine is changing rapidly. It is not useful in patients on dialysis. The eGFR calculation may not be applicable to patients at the low and high extremes of body sizes, pregnant women, and vegetarians.    GFR, Estimated  Date Value Ref Range Status  07/20/2022 52 (L) >60 mL/min Final    Comment:    (NOTE) Calculated using the CKD-EPI Creatinine Equation (2021)    eGFR  Date Value Ref Range Status  05/19/2022 47 (L) >59 mL/min/1.73 Final         Passed - Patient is not pregnant      Passed - Valid encounter within last 12 months    Recent Outpatient Visits           4 months ago Primary hypertension   [place] [facility] Alfredia Ferguson, PA-C   6 months ago Primary hypertension   [place] [facility] Alfredia Ferguson, PA-C   6 months ago Rectal bleeding   [place] [facility] Alfredia Ferguson, PA-C   6 months ago Hypertensive urgency   [place] [facility] Alfredia Ferguson, PA-C   9 months ago Hypertensive urgency   [place] [facility] Alfredia Ferguson, [place]

## 2022-12-03 NOTE — Telephone Encounter (Signed)
  Chief Complaint: Congestion Symptoms: Nose stuffy, sinus pressure, headache Frequency: This AM Pertinent Negatives: Patient denies fever, SOB Disposition: [] ED /[] Urgent Care (no appt availability in office) / [] Appointment(In office/virtual)/ []  [facility]/ [] Home Care/ [x] Refused Recommended Disposition /[] [place] [facility]/ []  Follow-up with PCP Additional Notes:  Pt called for inhaler refill, sent to triage as pt sounded "Winded."   Denies SOB, "Just need the inhaler, I'm a smoker." Chest congestion, sneezing. States cannot be seen OV, "No car right now." States she will test for covid. Care advise provided, pt verbalizes understanding. Will CB to secure appt "When I find out about my car." ADvised UC for worsening symptoms.  Reason for Disposition  [1] Sinus pain (not just congestion) AND [2] fever    No fever  Answer Assessment - Initial Assessment Questions 1. LOCATION: "Where does it hurt?"      Stuffy nose, sinus pressure, congestion 2. ONSET: "When did the sinus pain start?"  (e.g., hours, days)      Today 3. SEVERITY: "How bad is the pain?"   (Scale 1-10; mild, moderate or severe)   - MILD (1-3): doesn't interfere with normal activities    - MODERATE (4-7): interferes with normal activities (e.g., work or school) or awakens from sleep   - SEVERE (8-10): excruciating pain and patient unable to do any normal activities        moderate 4. RECURRENT SYMPTOM: "Have you ever had sinus problems before?" If Yes, ask: "When was the last time?" and "What happened that time?"      Yes 5. NASAL CONGESTION: "Is the nose blocked?" If Yes, ask: "Can you open it or must you breathe through your mouth?"     Nose 6. NASAL DISCHARGE: "Do you have discharge from your nose?" If so ask, "What color?"     Sneezing 7. FEVER: "Do you have a fever?" If Yes, ask: "What is it, how was it measured, and when did it start?"      no 8. OTHER SYMPTOMS: "Do you have any other symptoms?"  (e.g., sore throat, cough, earache, difficulty breathing)     "Smokers cough."  Protocols used: Sinus Pain or Congestion-A-AH

## 2023-06-16 ENCOUNTER — Other Ambulatory Visit: Payer: Self-pay | Admitting: Family Medicine

## 2023-06-16 DIAGNOSIS — F411 Generalized anxiety disorder: Secondary | ICD-10-CM

## 2023-06-20 ENCOUNTER — Other Ambulatory Visit: Payer: Self-pay | Admitting: Family Medicine

## 2023-06-20 DIAGNOSIS — F411 Generalized anxiety disorder: Secondary | ICD-10-CM

## 2023-08-09 ENCOUNTER — Other Ambulatory Visit: Payer: Self-pay | Admitting: Family Medicine

## 2023-08-09 DIAGNOSIS — F411 Generalized anxiety disorder: Secondary | ICD-10-CM

## 2023-08-10 NOTE — Telephone Encounter (Signed)
 Requested medications are due for refill today.  yes  Requested medications are on the active medications list.  yes  Last refill. 12/03/2022 #180 0 rf  Future visit scheduled.   no  Notes to clinic.  Tamara Hoffman last seen 07/24/2022. Tamara Hoffman has missed several appts.    Requested Prescriptions  Pending Prescriptions Disp Refills   busPIRone  (BUSPAR ) 5 MG tablet [Pharmacy Med Name: BUSPIRONE  HCL 5 MG TABLET] 60 tablet 2    Sig: TAKE 1 TABLET BY MOUTH 2 TIMES DAILY. PLEASE MAKE AN APPOINTMENT IN OFFICE     Psychiatry: Anxiolytics/Hypnotics - Non-controlled Failed - 08/10/2023  2:46 PM      Failed - Valid encounter within last 12 months    Recent Outpatient Visits   None

## 2024-02-15 ENCOUNTER — Ambulatory Visit: Payer: Self-pay

## 2024-02-15 NOTE — Telephone Encounter (Signed)
2nd attempt. Left message.

## 2024-02-15 NOTE — Telephone Encounter (Signed)
 3rd attempt. Left message.

## 2024-02-15 NOTE — Telephone Encounter (Signed)
° ° ° °  Copied from CRM #[id number]. Topic: Clinical - Red Word Triage >> Feb 15, 2024  9:47 AM Wess RAMAN wrote: Red Word that prompted transfer to Nurse Triage: COPD aggravated, Constipation, high level of anxiety  Would like an appt for January since that is when her coverage kicks in

## 2024-03-10 ENCOUNTER — Encounter: Payer: Self-pay | Admitting: Family Medicine

## 2024-03-10 NOTE — Progress Notes (Unsigned)
 "   Transition of care patient visit   Patient: Tamara Hoffman   DOB: 1965-08-26   59 y.o. Female  MRN: [id number] Visit Date: 03/10/2024  Today's healthcare provider: LAURAINE LOISE BUOY, DO   No chief complaint on file.  Subjective    Tamara Hoffman is a 59 y.o. female who presents today for transfer of care.  HPI    ***  Past Medical History:  Diagnosis Date   Arthritis    GERD (gastroesophageal reflux disease)    Heart murmur    Hypertension    Past Surgical History:  Procedure Laterality Date   COLONOSCOPY WITH PROPOFOL  N/A 05/26/2022   Procedure: COLONOSCOPY WITH PROPOFOL ;  Surgeon: Therisa Bi, MD;  Location: [facility] ENDOSCOPY;  Service: Gastroenterology;  Laterality: N/A;   ESOPHAGOGASTRODUODENOSCOPY (EGD) WITH PROPOFOL  N/A 05/26/2022   Procedure: ESOPHAGOGASTRODUODENOSCOPY (EGD) WITH PROPOFOL ;  Surgeon: Therisa Bi, MD;  Location: [facility] ENDOSCOPY;  Service: Gastroenterology;  Laterality: N/A;   ESOPHAGOGASTRODUODENOSCOPY (EGD) WITH PROPOFOL  N/A 07/20/2022   Procedure: ESOPHAGOGASTRODUODENOSCOPY (EGD) WITH PROPOFOL ;  Surgeon: Unk Corinn Skiff, MD;  Location: [facility] ENDOSCOPY;  Service: Gastroenterology;  Laterality: N/A;   LEEP     Family Status  Relation Name Status   Mother  Deceased   Father  Alive   Sister  Alive   MGM  (Not Specified)   Sister  (Not Specified)  No partnership data on file   Family History  Problem Relation Age of Onset   Diabetes Mother    Heart Problems Mother    Heart failure Mother    Hypertension Father    Heart Problems Father    Coronary artery disease Father    Diabetes Sister    Heart murmur Sister    Cancer Maternal Grandmother    Heart murmur Sister    Social History   Socioeconomic History   Marital status: Divorced    Spouse name: Not on file   Number of children: Not on file   Years of education: Not on file   Highest education level: GED or equivalent  Occupational History   Not on file  Tobacco Use   Smoking status:  Every Day    Current packs/day: 1.00    Average packs/day: 1 pack/day for 22.0 years (22.0 ttl pk-yrs)    Types: Cigarettes   Smokeless tobacco: Never  Vaping Use   Vaping status: Every Day  Substance and Sexual Activity   Alcohol use: Yes    Alcohol/week: 5.0 - 7.0 standard drinks of alcohol    Types: 5 - 7 Cans of beer per week    Comment: now is almost every day/occasional   Drug use: Not Currently    Types: Marijuana    Comment: Last used 1 week ago.   Sexual activity: Not on file  Other Topics Concern   Not on file  Social History Narrative   Independent at baseline. Son lives with her.   Social Drivers of Health   Tobacco Use: High Risk (07/24/2022)   Patient History    Smoking Tobacco Use: Every Day    Smokeless Tobacco Use: Never    Passive Exposure: Not on file  Financial Resource Strain: Low Risk (05/24/2022)   Overall Financial Resource Strain (CARDIA)    Difficulty of Paying Living Expenses: Not very hard  Food Insecurity: No Food Insecurity (07/19/2022)   Hunger Vital Sign    Worried About Running Out of Food in the Last Year: Never true    Ran Out of Food  in the Last Year: Never true  Recent Concern: Food Insecurity - Food Insecurity Present (05/24/2022)   Hunger Vital Sign    Worried About Running Out of Food in the Last Year: Sometimes true    Ran Out of Food in the Last Year: Never true  Transportation Needs: No Transportation Needs (07/19/2022)   PRAPARE - Administrator, Civil Service (Medical): No    Lack of Transportation (Non-Medical): No  Physical Activity: Sufficiently Active (05/24/2022)   Exercise Vital Sign    Days of Exercise per Week: 5 days    Minutes of Exercise per Session: 150+ min  Stress: No Stress Concern Present (05/24/2022)   [employer] of Occupational Health - Occupational Stress Questionnaire    Feeling of Stress : Only a little  Social Connections: Socially Isolated (05/24/2022)   Social Connection and Isolation  Panel    Frequency of Communication with Friends and Family: Three times a week    Frequency of Social Gatherings with Friends and Family: Once a week    Attends Religious Services: Never    Database Administrator or Organizations: No    Attends Engineer, Structural: Not on file    Marital Status: Divorced  Depression (PHQ2-9): Medium Risk (07/24/2022)   Depression (PHQ2-9)    PHQ-2 Score: 7  Alcohol Screen: Low Risk (07/24/2022)   Alcohol Screen    Last Alcohol Screening Score (AUDIT): 1  Housing: Patient Declined (07/19/2022)   Housing    Last Housing Risk Score: 0  Utilities: Not At Risk (07/19/2022)   AHC Utilities    Threatened with loss of utilities: No  Health Literacy: Not on file   Show/hide medication list[1] Allergies[2]  Immunization History  Administered Date(s) Administered   PFIZER(Purple Top)SARS-COV-2 Vaccination 07/08/2019, 08/04/2019, 02/10/2020   Pneumococcal Polysaccharide-23 04/24/2019    Health Maintenance  Topic Date Due   DTaP/Tdap/Td (1 - Tdap) Never done   Hepatitis B Vaccines 19-59 Average Risk (1 of 3 - 19+ 3-dose series) Never done   Lung Cancer Screening  Never done   Zoster Vaccines- Shingrix (1 of 2) Never done   Pneumococcal Vaccine: 50+ Years (2 of 2 - PCV) 04/23/2020   Influenza Vaccine  Never done   COVID-19 Vaccine (4 - 2025-26 season) 11/01/2023   Cervical Cancer Screening (HPV/Pap Cotest)  05/15/2024   Mammogram  06/14/2024   Colonoscopy  05/25/2032   Hepatitis C Screening  Completed   HIV Screening  Completed   HPV VACCINES  Aged Out   Meningococcal B Vaccine  Aged Out    Patient Care Team: Emilio Kelly DASEN, FNP (Inactive) as PCP - General (Family Medicine)  Review of Systems  {Insert previous labs (optional):23779} {See past labs  Heme  Chem  Endocrine  Serology  Results Review (optional):1}   Objective    LMP  (LMP Unknown)  {Insert last BP/Wt (optional):23777}{See vitals history (optional):1}   Physical  Exam   Depression Screen    07/24/2022    9:01 AM 05/19/2022    2:35 PM 05/13/2022   10:01 AM 02/03/2022    8:45 AM  PHQ 2/9 Scores  PHQ - 2 Score 2 4 2 2   PHQ- 9 Score 7  16  9  9       Data saved with a previous flowsheet row definition   No results found for any visits on 03/10/24.  Assessment & Plan     Primary hypertension  Generalized anxiety disorder  Depression, major, single  episode, moderate (HCC)  Alcohol use disorder  Polysubstance abuse (HCC)  Tobacco use disorder  Need for hepatitis B screening test  Need for pneumococcal 20-valent conjugate vaccination  Need for tetanus booster  Need for shingles vaccine  Need for influenza vaccination     ***  No follow-ups on file.     I discussed the assessment and treatment plan with the patient  The patient was provided an opportunity to ask questions and all were answered. The patient agreed with the plan and demonstrated an understanding of the instructions.   The patient was advised to call back or seek an in-person evaluation if the symptoms worsen or if the condition fails to improve as anticipated.    LAURAINE LOISE BUOY, DO  [place] [facility] [phone] (phone) [phone] (fax)  [facility]    [1]  Outpatient Medications Prior to Visit  Medication Sig   albuterol  (VENTOLIN  HFA) 108 (90 Base) MCG/ACT inhaler Inhale 2 puffs into the lungs every 6 (six) hours as needed for wheezing or shortness of breath.   busPIRone  (BUSPAR ) 5 MG tablet Take 1 tablet (5 mg total) by mouth 2 (two) times daily. Please make an appointment in office   celecoxib  (CELEBREX ) 100 MG capsule Take 1 capsule (100 mg total) by mouth 2 (two) times daily.   omeprazole  (PRILOSEC) 20 MG capsule Take 1 capsule (20 mg total) by mouth 2 (two) times daily before a meal.   rosuvastatin  (CRESTOR ) 5 MG tablet Take 1 tablet (5 mg total) by mouth daily. (Patient taking differently: Take 5 mg by mouth  daily. Pt states that she stopped taking d/t med making her dizziness. Spoke with PCP who told her to f/u with cards, but she hasn't gotten a chance to. PCP prescribed med.)   valsartan -hydrochlorothiazide  (DIOVAN -HCT) 320-12.5 MG tablet Take 1 tablet by mouth daily. (Patient not taking: Reported on 07/24/2022)   No facility-administered medications prior to visit.  [2]  Allergies Allergen Reactions   Promethazine Swelling    Pt states face turned really red all over and had full body swelling   Promethazine Hcl Swelling   "

## 2024-04-27 ENCOUNTER — Encounter: Payer: Self-pay | Admitting: Family Medicine
